# Patient Record
Sex: Female | Born: 2000 | Race: White | Hispanic: No | Marital: Married | Smoking: Never smoker
Health system: Southern US, Community
[De-identification: ages and names within clinical notes are randomized; demographics above are authoritative.]

## PROBLEM LIST (undated history)

## (undated) DIAGNOSIS — J45909 Unspecified asthma, uncomplicated: Secondary | ICD-10-CM

## (undated) DIAGNOSIS — R55 Syncope and collapse: Secondary | ICD-10-CM

## (undated) DIAGNOSIS — F4310 Post-traumatic stress disorder, unspecified: Secondary | ICD-10-CM

## (undated) DIAGNOSIS — F840 Autistic disorder: Secondary | ICD-10-CM

## (undated) DIAGNOSIS — F419 Anxiety disorder, unspecified: Secondary | ICD-10-CM

## (undated) DIAGNOSIS — F32A Depression, unspecified: Secondary | ICD-10-CM

## (undated) DIAGNOSIS — F329 Major depressive disorder, single episode, unspecified: Secondary | ICD-10-CM

## (undated) DIAGNOSIS — Z1501 Genetic susceptibility to malignant neoplasm of breast: Secondary | ICD-10-CM

## (undated) DIAGNOSIS — F909 Attention-deficit hyperactivity disorder, unspecified type: Secondary | ICD-10-CM

## (undated) HISTORY — PX: WISDOM TOOTH EXTRACTION: SHX21

## (undated) HISTORY — DX: Genetic susceptibility to malignant neoplasm of breast: Z15.01

## (undated) HISTORY — DX: Unspecified asthma, uncomplicated: J45.909

## (undated) HISTORY — PX: MOUTH SURGERY: SHX715

## (undated) HISTORY — DX: Syncope and collapse: R55

## (undated) HISTORY — PX: DENTAL SURGERY: SHX609

## (undated) HISTORY — PX: CAUTERIZE INNER NOSE: SHX279

---
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
## 2019-01-09 ENCOUNTER — Telehealth: Payer: Self-pay | Admitting: Obstetrics and Gynecology

## 2019-01-09 NOTE — Telephone Encounter (Signed)
Pt not available to speak to regarding restrictions.

## 2019-01-10 ENCOUNTER — Other Ambulatory Visit: Payer: Self-pay

## 2019-01-10 ENCOUNTER — Ambulatory Visit (INDEPENDENT_AMBULATORY_CARE_PROVIDER_SITE_OTHER): Payer: Medicaid Other

## 2019-01-10 DIAGNOSIS — Z3A01 Less than 8 weeks gestation of pregnancy: Secondary | ICD-10-CM

## 2019-01-10 DIAGNOSIS — O3680X0 Pregnancy with inconclusive fetal viability, not applicable or unspecified: Secondary | ICD-10-CM | POA: Diagnosis not present

## 2019-01-10 NOTE — Progress Notes (Signed)
[place] 7+6 wks,single IUP w/ys,positive fht 171 bpm,crl 16.87 mm

## 2019-02-05 ENCOUNTER — Other Ambulatory Visit: Payer: Self-pay

## 2019-02-05 ENCOUNTER — Encounter (HOSPITAL_COMMUNITY): Payer: Self-pay | Admitting: *Deleted

## 2019-02-05 DIAGNOSIS — O99511 Diseases of the respiratory system complicating pregnancy, first trimester: Secondary | ICD-10-CM | POA: Diagnosis not present

## 2019-02-05 DIAGNOSIS — O99891 Other specified diseases and conditions complicating pregnancy: Secondary | ICD-10-CM | POA: Diagnosis not present

## 2019-02-05 DIAGNOSIS — R0789 Other chest pain: Secondary | ICD-10-CM | POA: Insufficient documentation

## 2019-02-05 DIAGNOSIS — Z3A12 12 weeks gestation of pregnancy: Secondary | ICD-10-CM | POA: Diagnosis not present

## 2019-02-05 DIAGNOSIS — Z79899 Other long term (current) drug therapy: Secondary | ICD-10-CM | POA: Insufficient documentation

## 2019-02-05 NOTE — ED Triage Notes (Signed)
Pt woke up with left rib pain, denies any injury.  Denies pain with deep breathes.

## 2019-02-06 ENCOUNTER — Emergency Department (HOSPITAL_COMMUNITY)
Admission: EM | Admit: 2019-02-06 | Discharge: 2019-02-06 | Disposition: A | Discharge status: Home / Self Care | Payer: Medicaid Other | Attending: Emergency Medicine | Admitting: Emergency Medicine

## 2019-02-06 DIAGNOSIS — Z3A12 12 weeks gestation of pregnancy: Secondary | ICD-10-CM

## 2019-02-06 DIAGNOSIS — R079 Chest pain, unspecified: Secondary | ICD-10-CM

## 2019-02-06 NOTE — Discharge Instructions (Addendum)
Use ice and heat over the area for comfort. Take acetaminophen 650 mg 4 times a day for pain.  Recheck if you get fever, cough, shortness of breath or have wheezing not controlled by your inhaler.  [facility].

## 2019-02-06 NOTE — ED Provider Notes (Signed)
[facility] EMERGENCY DEPARTMENT Provider Note   CSN: [id number] Arrival date & time: 02/05/19  2238   Time seen 1:37 AM  History   Chief Complaint Chief Complaint  Patient presents with  . Chest Pain    left rib pain     HPI Audrey Matthews is a 18 y.o. female.     HPI patient states about 10 AM today after she got up she started having left lateral chest pain that she describes as achy and constant.  Laying on her left side makes it worse, nothing makes it feel better although she did states she took 2 Tylenol at noon and it helped for about an hour.  She denies cough, fever, rhinorrhea, shortness of breath, wheezing, loss of taste or smell.  She denies any injury although she states she is a violent sleeper and she is even given herself a black why sleeping.  She states she has had this pain before when she was having a flareup of her asthma when she was about 18 years old.  Patient states she is G1, P0 Ab0, approximately [redacted] weeks pregnant.  [facility].  She denies any problems of her pregnancy other than lots of nausea.  PCP Practice, [facility]   Past Medical History:  Diagnosis Date  . ADHD (attention deficit hyperactivity disorder)   . Anxiety   . Asthma   . Autism   . Depression   . PTSD (post-traumatic stress disorder)     Patient Active Problem List   Diagnosis Date Noted  . Encounter to determine fetal viability of pregnancy 12/27/2018  . Less than [redacted] weeks gestation of pregnancy 12/27/2018  . Pregnancy examination or test, positive result 12/27/2018  . Depression 12/27/2018  . Decreased appetite 10/08/2015  . Severe episode of recurrent major depressive disorder, without psychotic features (HCC)   . Attention-deficit hyperactivity disorder, other type   . Attention deficit hyperactivity disorder (ADHD) 10/04/2015  . Irritability and anger 10/04/2015  . MDD (major depressive disorder), recurrent episode, severe (HCC) 10/03/2015    Past Surgical History:  Procedure Laterality Date  . DENTAL SURGERY    . MOUTH SURGERY       OB History    Gravida  1   Para      Term      Preterm      AB      Living        SAB      TAB      Ectopic      Multiple      Live Births               Home Medications    Prior to Admission medications   Medication Sig Start Date End Date Taking? Authorizing Provider  acetaminophen (TYLENOL) 500 MG tablet Take 500 mg by mouth every 6 (six) hours as needed for moderate pain.    [provider]  Beclomethasone Dipropionate (QVAR IN) Inhale into the lungs as needed.    [provider]  prenatal vitamin w/FE, FA (PRENATAL 1 + 1) 27-1 MG TABS tablet Take 1 tablet by mouth daily at 12 noon. 12/27/18   Adline PotterGriffin, Jennifer A, NP    Family History Family History  Problem Relation Age of Onset  . Depression Mother   . Anxiety disorder Mother   . ADD / ADHD Mother   . Drug abuse Father   . Depression Sister   . Depression Maternal Grandmother  Social History Social History   Tobacco Use  . Smoking status: Never Smoker  . Smokeless tobacco: Never Used  Substance Use Topics  . Alcohol use: No  . Drug use: No     Allergies   Patient has no known allergies.   Review of Systems Review of Systems  All other systems reviewed and are negative.    Physical Exam Updated Vital Signs BP (!) 141/84 (BP Location: Right Arm)   Pulse (!) 105   Temp 99.6 F (37.6 C) (Oral)   Resp 14   Ht 5\' 1"  (1.549 m)   Wt 71.7 kg   LMP 11/16/2018   SpO2 100%   BMI 29.85 kg/m   Physical Exam Vitals signs and nursing note reviewed.  Constitutional:      General: She is not in acute distress.    Appearance: Normal appearance.  HENT:     Head: Normocephalic and atraumatic.     Right Ear: External ear normal.     Left Ear: External ear normal.     Nose: Nose normal.     Mouth/Throat:     Mouth: Mucous membranes are dry.     Pharynx: No  oropharyngeal exudate or posterior oropharyngeal erythema.  Eyes:     Extraocular Movements: Extraocular movements intact.     Conjunctiva/sclera: Conjunctivae normal.     Pupils: Pupils are equal, round, and reactive to light.  Neck:     Musculoskeletal: Normal range of motion.  Cardiovascular:     Rate and Rhythm: Normal rate and regular rhythm.  Pulmonary:     Effort: Pulmonary effort is normal. No respiratory distress.     Breath sounds: Normal breath sounds. No stridor. No wheezing, rhonchi or rales.  Chest:     Chest wall: Tenderness present.       Comments: Patient indicates she is having pain in a area about the size of 2 palms and the left axilla with a tender spot that is in the anterior axillary line and in the middle of the upper and lower borders of the discomfort.  When I look at the skin the skin is normal without redness, I do not feel any subcutaneous masses or crepitance.There is no bruising noted to the skin. Musculoskeletal: Normal range of motion.  Skin:    General: Skin is warm and dry.  Neurological:     General: No focal deficit present.     Mental Status: She is alert and oriented to person, place, and time.  Psychiatric:        Mood and Affect: Mood normal.        Behavior: Behavior normal.        Thought Content: Thought content normal.      ED Treatments / Results  Labs (all labs ordered are listed, but only abnormal results are displayed) Labs Reviewed - No data to display  EKG None  Radiology No results found.  Procedures Procedures (including critical care time)  Medications Ordered in ED Medications - No data to display   Initial Impression / Assessment and Plan / ED Course  I have reviewed the triage vital signs and the nursing notes.  Pertinent labs & imaging results that were available during my care of the patient were reviewed by me and considered in my medical decision making (see chart for details).       We discussed  getting x-rays however I do not suspect a rib fracture, she has no symptoms such as fever or  coughing or shortness of breath or even wheezing.  Tylenol did help her pain before.  I told her to return if she gets fever, worsening shortness of breath and we would consider doing x-rays at that time.  Otherwise she should continue with the Tylenol and use ice packs for comfort.  Go to have the nurses do fetal heart tones.  FHT our nursing staff was unable to assess fetal heart tones, however I reassured patient that in the ED the nurses are not always able to find the heart tone in early pregnancy like they can in the OB office.  She is having no lower abdominal pain or bleeding.  She can keep her prenatal appointments.  Final Clinical Impressions(s) / ED Diagnoses   Final diagnoses:  Chest pain, unspecified type  [redacted] weeks gestation of pregnancy    ED Discharge Orders    None    OTC acetaminophen  Plan discharge  Devoria Albe, MD, Concha Pyo, MD 02/06/19 0230

## 2019-02-06 NOTE — ED Notes (Signed)
Unable to assess fetal heart tone with doppler.

## 2019-02-08 ENCOUNTER — Other Ambulatory Visit: Payer: Self-pay | Admitting: Obstetrics & Gynecology

## 2019-02-08 ENCOUNTER — Telehealth: Payer: Self-pay | Admitting: Advanced Practice Midwife

## 2019-02-08 DIAGNOSIS — Z3682 Encounter for antenatal screening for nuchal translucency: Secondary | ICD-10-CM

## 2019-02-08 NOTE — Telephone Encounter (Signed)

## 2019-02-09 ENCOUNTER — Ambulatory Visit (INDEPENDENT_AMBULATORY_CARE_PROVIDER_SITE_OTHER): Payer: Medicaid Other

## 2019-02-09 ENCOUNTER — Encounter: Payer: Self-pay | Admitting: Advanced Practice Midwife

## 2019-02-09 ENCOUNTER — Ambulatory Visit: Payer: Medicaid Other | Admitting: *Deleted

## 2019-02-09 ENCOUNTER — Other Ambulatory Visit: Payer: Self-pay

## 2019-02-09 ENCOUNTER — Ambulatory Visit (INDEPENDENT_AMBULATORY_CARE_PROVIDER_SITE_OTHER): Payer: Medicaid Other | Admitting: Advanced Practice Midwife

## 2019-02-09 VITALS — BP 128/85 | HR 84 | Wt 150.0 lb

## 2019-02-09 DIAGNOSIS — Z3A12 12 weeks gestation of pregnancy: Secondary | ICD-10-CM

## 2019-02-09 DIAGNOSIS — Z3401 Encounter for supervision of normal first pregnancy, first trimester: Secondary | ICD-10-CM

## 2019-02-09 DIAGNOSIS — O0990 Supervision of high risk pregnancy, unspecified, unspecified trimester: Secondary | ICD-10-CM | POA: Insufficient documentation

## 2019-02-09 DIAGNOSIS — F332 Major depressive disorder, recurrent severe without psychotic features: Secondary | ICD-10-CM

## 2019-02-09 DIAGNOSIS — Z1371 Encounter for nonprocreative screening for genetic disease carrier status: Secondary | ICD-10-CM | POA: Diagnosis not present

## 2019-02-09 DIAGNOSIS — Z1389 Encounter for screening for other disorder: Secondary | ICD-10-CM

## 2019-02-09 DIAGNOSIS — Z113 Encounter for screening for infections with a predominantly sexual mode of transmission: Secondary | ICD-10-CM

## 2019-02-09 DIAGNOSIS — Z3682 Encounter for antenatal screening for nuchal translucency: Secondary | ICD-10-CM

## 2019-02-09 DIAGNOSIS — Z36 Encounter for antenatal screening for chromosomal anomalies: Secondary | ICD-10-CM

## 2019-02-09 DIAGNOSIS — Z3400 Encounter for supervision of normal first pregnancy, unspecified trimester: Secondary | ICD-10-CM | POA: Insufficient documentation

## 2019-02-09 DIAGNOSIS — Z1379 Encounter for other screening for genetic and chromosomal anomalies: Secondary | ICD-10-CM

## 2019-02-09 DIAGNOSIS — Z0283 Encounter for blood-alcohol and blood-drug test: Secondary | ICD-10-CM

## 2019-02-09 DIAGNOSIS — Z130 Encounter for screening for diseases of the blood and blood-forming organs and certain disorders involving the immune mechanism: Secondary | ICD-10-CM

## 2019-02-09 DIAGNOSIS — J45909 Unspecified asthma, uncomplicated: Secondary | ICD-10-CM | POA: Insufficient documentation

## 2019-02-09 DIAGNOSIS — Z331 Pregnant state, incidental: Secondary | ICD-10-CM

## 2019-02-09 DIAGNOSIS — J4520 Mild intermittent asthma, uncomplicated: Secondary | ICD-10-CM

## 2019-02-09 LAB — POCT URINALYSIS DIPSTICK OB
Blood, UA: NEGATIVE
Glucose, UA: NEGATIVE
Ketones, UA: NEGATIVE
Leukocytes, UA: NEGATIVE
Nitrite, UA: NEGATIVE
POC,PROTEIN,UA: NEGATIVE

## 2019-02-09 MED ORDER — BLOOD PRESSURE MONITOR MISC: 0 refills | Status: DC

## 2019-02-09 MED ORDER — OMEPRAZOLE 20 MG PO CPDR: 20.0000 mg | DELAYED_RELEASE_CAPSULE | Freq: Every day | ORAL | 6 refills | Status: DC

## 2019-02-09 NOTE — Patient Instructions (Signed)
 First Trimester of Pregnancy The first trimester of pregnancy is from week 1 until the end of week 12 (months 1 through 3). A week after a sperm fertilizes an egg, the egg will implant on the wall of the uterus. This embryo will begin to develop into a baby. Genes from you and your partner are forming the baby. The female genes determine whether the baby is a boy or a girl. At 6-8 weeks, the eyes and face are formed, and the heartbeat can be seen on ultrasound. At the end of 12 weeks, all the baby's organs are formed.  Now that you are pregnant, you will want to do everything you can to have a healthy baby. Two of the most important things are to get good prenatal care and to follow your health care provider's instructions. Prenatal care is all the medical care you receive before the baby's birth. This care will help prevent, find, and treat any problems during the pregnancy and childbirth. BODY CHANGES Your body goes through many changes during pregnancy. The changes vary from woman to woman.   You may gain or lose a couple of pounds at first.  You may feel sick to your stomach (nauseous) and throw up (vomit). If the vomiting is uncontrollable, call your health care provider.  You may tire easily.  You may develop headaches that can be relieved by medicines approved by your health care provider.  You may urinate more often. Painful urination may mean you have a bladder infection.  You may develop heartburn as a result of your pregnancy.  You may develop constipation because certain hormones are causing the muscles that push waste through your intestines to slow down.  You may develop hemorrhoids or swollen, bulging veins (varicose veins).  Your breasts may begin to grow larger and become tender. Your nipples may stick out more, and the tissue that surrounds them (areola) may become darker.  Your gums may bleed and may be sensitive to brushing and flossing.  Dark spots or blotches  (chloasma, mask of pregnancy) may develop on your face. This will likely fade after the baby is born.  Your menstrual periods will stop.  You may have a loss of appetite.  You may develop cravings for certain kinds of food.  You may have changes in your emotions from day to day, such as being excited to be pregnant or being concerned that something may go wrong with the pregnancy and baby.  You may have more vivid and strange dreams.  You may have changes in your hair. These can include thickening of your hair, rapid growth, and changes in texture. Some women also have hair loss during or after pregnancy, or hair that feels dry or thin. Your hair will most likely return to normal after your baby is born. WHAT TO EXPECT AT YOUR PRENATAL VISITS During a routine prenatal visit:  You will be weighed to make sure you and the baby are growing normally.  Your blood pressure will be taken.  Your abdomen will be measured to track your baby's growth.  The fetal heartbeat will be listened to starting around week 10 or 12 of your pregnancy.  Test results from any previous visits will be discussed. Your health care provider may ask you:  How you are feeling.  If you are feeling the baby move.  If you have had any abnormal symptoms, such as leaking fluid, bleeding, severe headaches, or abdominal cramping.  If you have any questions. Other   tests that may be performed during your first trimester include:  Blood tests to find your blood type and to check for the presence of any previous infections. They will also be used to check for low iron levels (anemia) and Rh antibodies. Later in the pregnancy, blood tests for diabetes will be done along with other tests if problems develop.  Urine tests to check for infections, diabetes, or protein in the urine.  An ultrasound to confirm the proper growth and development of the baby.  An amniocentesis to check for possible genetic problems.  Fetal  screens for spina bifida and Down syndrome.  You may need other tests to make sure you and the baby are doing well. HOME CARE INSTRUCTIONS  Medicines  Follow your health care provider's instructions regarding medicine use. Specific medicines may be either safe or unsafe to take during pregnancy.  Take your prenatal vitamins as directed.  If you develop constipation, try taking a stool softener if your health care provider approves. Diet  Eat regular, well-balanced meals. Choose a variety of foods, such as meat or vegetable-based protein, fish, milk and low-fat dairy products, vegetables, fruits, and whole grain breads and cereals. Your health care provider will help you determine the amount of weight gain that is right for you.  Avoid raw meat and uncooked cheese. These carry germs that can cause birth defects in the baby.  Eating four or five small meals rather than three large meals a day may help relieve nausea and vomiting. If you start to feel nauseous, eating a few soda crackers can be helpful. Drinking liquids between meals instead of during meals also seems to help nausea and vomiting.  If you develop constipation, eat more high-fiber foods, such as fresh vegetables or fruit and whole grains. Drink enough fluids to keep your urine clear or pale yellow. Activity and Exercise  Exercise only as directed by your health care provider. Exercising will help you:  Control your weight.  Stay in shape.  Be prepared for labor and delivery.  Experiencing pain or cramping in the lower abdomen or low back is a good sign that you should stop exercising. Check with your health care provider before continuing normal exercises.  Try to avoid standing for long periods of time. Move your legs often if you must stand in one place for a long time.  Avoid heavy lifting.  Wear low-heeled shoes, and practice good posture.  You may continue to have sex unless your health care provider directs you  otherwise. Relief of Pain or Discomfort  Wear a good support bra for breast tenderness.   Take warm sitz baths to soothe any pain or discomfort caused by hemorrhoids. Use hemorrhoid cream if your health care provider approves.   Rest with your legs elevated if you have leg cramps or low back pain.  If you develop varicose veins in your legs, wear support hose. Elevate your feet for 15 minutes, 3-4 times a day. Limit salt in your diet. Prenatal Care  Schedule your prenatal visits by the twelfth week of pregnancy. They are usually scheduled monthly at first, then more often in the last 2 months before delivery.  Write down your questions. Take them to your prenatal visits.  Keep all your prenatal visits as directed by your health care provider. Safety  Wear your seat belt at all times when driving.  Make a list of emergency phone numbers, including numbers for family, friends, the hospital, and police and fire departments. General   Tips  Ask your health care provider for a referral to a local prenatal education class. Begin classes no later than at the beginning of month 6 of your pregnancy.  Ask for help if you have counseling or nutritional needs during pregnancy. Your health care provider can offer advice or refer you to specialists for help with various needs.  Do not use hot tubs, steam rooms, or saunas.  Do not douche or use tampons or scented sanitary pads.  Do not cross your legs for long periods of time.  Avoid cat litter boxes and soil used by cats. These carry germs that can cause birth defects in the baby and possibly loss of the fetus by miscarriage or stillbirth.  Avoid all smoking, herbs, alcohol, and medicines not prescribed by your health care provider. Chemicals in these affect the formation and growth of the baby.  Schedule a dentist appointment. At home, brush your teeth with a soft toothbrush and be gentle when you floss. SEEK MEDICAL CARE IF:   You have  dizziness.  You have mild pelvic cramps, pelvic pressure, or nagging pain in the abdominal area.  You have persistent nausea, vomiting, or diarrhea.  You have a bad smelling vaginal discharge.  You have pain with urination.  You notice increased swelling in your face, hands, legs, or ankles. SEEK IMMEDIATE MEDICAL CARE IF:   You have a fever.  You are leaking fluid from your vagina.  You have spotting or bleeding from your vagina.  You have severe abdominal cramping or pain.  You have rapid weight gain or loss.  You vomit blood or material that looks like coffee grounds.  You are exposed to German measles and have never had them.  You are exposed to fifth disease or chickenpox.  You develop a severe headache.  You have shortness of breath.  You have any kind of trauma, such as from a fall or a car accident. Document Released: 03/03/2001 Document Revised: 07/24/2013 Document Reviewed: 01/17/2013 Exit[employer] Patient Information 2015 [employer]. This information is not intended to replace advice given to you by your health care provider. Make sure you discuss any questions you have with your health care provider.   Nausea & Vomiting  Have saltine crackers or pretzels by your bed and eat a few bites before you raise your head out of bed in the morning  Eat small frequent meals throughout the day instead of large meals  Drink plenty of fluids throughout the day to stay hydrated, just don't drink a lot of fluids with your meals.  This can make your stomach fill up faster making you feel sick  Do not brush your teeth right after you eat  Products with real ginger are good for nausea, like ginger ale and ginger hard candy Make sure it says made with real ginger!  Sucking on sour candy like lemon heads is also good for nausea  If your prenatal vitamins make you nauseated, take them at night so you will sleep through the nausea  Sea Bands  If you feel like you need  medicine for the nausea & vomiting please let us know  If you are unable to keep any fluids or food down please let us know   Constipation  Drink plenty of fluid, preferably water, throughout the day  Eat foods high in fiber such as fruits, vegetables, and grains  Exercise, such as walking, is a good way to keep your bowels regular  Drink warm fluids, especially warm   prune juice, or decaf coffee  Eat a 1/2 cup of real oatmeal (not instant), 1/2 cup applesauce, and 1/2-1 cup warm prune juice every day  If needed, you may take Colace (docusate sodium) stool softener once or twice a day to help keep the stool soft. If you are pregnant, wait until you are out of your first trimester (12-14 weeks of pregnancy)  If you still are having problems with constipation, you may take Miralax once daily as needed to help keep your bowels regular.  If you are pregnant, wait until you are out of your first trimester (12-14 weeks of pregnancy)  Safe Medications in Pregnancy   Acne: Benzoyl Peroxide Salicylic Acid  Backache/Headache: Tylenol: 2 regular strength every 4 hours OR              2 Extra strength every 6 hours  Colds/Coughs/Allergies: Benadryl (alcohol free) 25 mg every 6 hours as needed Breath right strips Claritin Cepacol throat lozenges Chloraseptic throat spray Cold-Eeze- up to three times per day Cough drops, alcohol free Flonase (by prescription only) Guaifenesin Mucinex Robitussin DM (plain only, alcohol free) Saline nasal spray/drops Sudafed (pseudoephedrine) & Actifed ** use only after [redacted] weeks gestation and if you do not have high blood pressure Tylenol Vicks Vaporub Zinc lozenges Zyrtec   Constipation: Colace Ducolax suppositories Fleet enema Glycerin suppositories Metamucil Milk of magnesia Miralax Senokot Smooth move tea  Diarrhea: Kaopectate Imodium A-D  *NO pepto Bismol  Hemorrhoids: Anusol Anusol HC Preparation  H Tucks  Indigestion: Tums Maalox Mylanta Zantac  Pepcid  Insomnia: Benadryl (alcohol free) 25mg every 6 hours as needed Tylenol PM Unisom, no Gelcaps  Leg Cramps: Tums MagGel  Nausea/Vomiting:  Bonine Dramamine Emetrol Ginger extract Sea bands Meclizine  Nausea medication to take during pregnancy:  Unisom (doxylamine succinate 25 mg tablets) Take one tablet daily at bedtime. If symptoms are not adequately controlled, the dose can be increased to a maximum recommended dose of two tablets daily (1/2 tablet in the morning, 1/2 tablet mid-afternoon and one at bedtime). Vitamin B6 100mg tablets. Take one tablet twice a day (up to 200 mg per day).  Skin Rashes: Aveeno products Benadryl cream or 25mg every 6 hours as needed Calamine Lotion 1% cortisone cream  Yeast infection: Gyne-lotrimin 7 Monistat 7   **If taking multiple medications, please check labels to avoid duplicating the same active ingredients **take medication as directed on the label ** Do not exceed 4000 mg of tylenol in 24 hours **Do not take medications that contain aspirin or ibuprofen      

## 2019-02-09 NOTE — Progress Notes (Signed)
[place] 12+1 wks,measurements c/w dates,crl 56.06 mm,normal ovaries bilat,anterior placenta gr 0,NB present,NT 1.4 mm,fhr 158 bpm

## 2019-02-09 NOTE — Progress Notes (Signed)
INITIAL OBSTETRICAL VISIT Patient name: Audrey Matthews MRN [id number]  Date of birth: 2000-05-17 Chief Complaint:   Initial Prenatal Visit (nt/it, heartburn)  History of Present Illness:   Audrey Matthews is a 18 y.o. G1P0  female at [redacted]w[redacted]d by LMP/8 week [place] with an Estimated Date of Delivery: 08/23/19 being seen today for her initial obstetrical visit.   Her obstetrical history is significant for first pregnancy.   Today she reports some heartburn (rx prilosec) and nausea (declines meds). .  Patient's last menstrual period was 11/16/2018 (exact date). Last pap n/a Review of Systems:   Pertinent items are noted in HPI Denies cramping/contractions, leakage of fluid, vaginal bleeding, abnormal vaginal discharge w/ itching/odor/irritation, headaches, visual changes, shortness of breath, chest pain, abdominal pain, severe nausea/vomiting, or problems with urination or bowel movements unless otherwise stated above.  Pertinent History Reviewed:  Reviewed past medical,surgical, social, obstetrical and family history.  Reviewed problem list, medications and allergies.  Some nausea, declines meds Has asthma, uses inhaler occasionally  Hx depression, ADHD/bipolar/PTSD.  Has been hospitalized 4 times in the past, says she's tired of it running her life, committed to keeping up with therapy.  Does group zoom depression calls once a week and one-on-one therapy once a week as well. No meds, aware that there are meds she can take if deemed necessary.   OB History  Gravida Para Term Preterm AB Living  1            SAB TAB Ectopic Multiple Live Births               # Outcome Date GA Lbr Len/2nd Weight Sex Delivery Anes PTL Lv  1 Current            Physical Assessment:   Vitals:   02/09/19 0950  BP: 128/85  Pulse: 84  Weight: 150 lb (68 kg)  Body mass index is 28.34 kg/m.       Physical Examination:  General appearance - well appearing, and in no distress  Mental status - alert, oriented to person,  place, and time  Psych:  She has a normal mood and affect  Skin - warm and dry, normal color, no suspicious lesions noted  Chest - effort normal, all lung fields clear to auscultation bilaterally  Heart - normal rate and regular rhythm  Abdomen - soft, nontender  Extremities:  No swelling or varicosities noted    via [place] 12+1 wks,measurements c/w dates,crl 56.06 mm,normal ovaries bilat,anterior placenta gr 0,NB present,NT 1.4 mm,fhr 158 bpm  Results for orders placed or performed in visit on 02/09/19 (from the past 24 hour(s))  POC Urinalysis Dipstick OB   Collection Time: 02/09/19 10:06 AM  Result Value Ref Range   Color, UA     Clarity, UA     Glucose, UA Negative Negative   Bilirubin, UA     Ketones, UA neg    Spec Grav, UA     Blood, UA neg    pH, UA     POC,PROTEIN,UA Negative Negative, Trace, Small (1+), Moderate (2+), Large (3+), 4+   Urobilinogen, UA     Nitrite, UA neg    Leukocytes, UA Negative Negative   Appearance     Odor      Assessment & Plan:  1) Low-Risk Pregnancy G1P0 at [redacted]w[redacted]d with an Estimated Date of Delivery: 08/23/19   2) Initial OB visit  3)  Depression/bipolar/PTSD:  Twice weekly therapy, no meds.   Meds:  Meds ordered this encounter  Medications  . Blood Pressure Monitor MISC    Sig: For regular home bp monitoring during pregnancy    Dispense:  1 each    Refill:  0    Z34.00  . omeprazole (PRILOSEC) 20 MG capsule    Sig: Take 1 capsule (20 mg total) by mouth daily.    Dispense:  30 capsule    Refill:  6    Order Specific Question:   Supervising Provider    Answer:   Florian Buff [[id number]]    Initial labs obtained Continue prenatal vitamins Reviewed n/v relief measures and warning s/s to report Reviewed recommended weight gain based on pre-gravid BMI Encouraged well-balanced diet Watched video for carrier screening/genetic testing:  Genetic Screening discussed First Screen and Integrated Screen: requested Cystic fibrosis screening  requested SMA screening requested Fragile X screening requested Ultrasound discussed; fetal survey: requested CCNC completed  Follow-up: Return in about 3 weeks (around 03/02/2019) for OB Mychart visit.   Orders Placed This Encounter  Procedures  . Urine Culture  . GC/Chlamydia Probe Amp  . Urinalysis, Routine w reflex microscopic  . Pain Management Screening Profile (10S)  . Obstetric Panel, Including HIV  . Sickle Cell Scr  . Integrated 1  . MaterniT 21 plus Core, Blood  . Inheritest Core(CF97,SMA,FraX)  . POC Urinalysis Dipstick OB    Christin Fudge DNP, CNM 02/09/2019 10:40 AM

## 2019-02-10 LAB — URINALYSIS, ROUTINE W REFLEX MICROSCOPIC

## 2019-02-10 LAB — PMP SCREEN PROFILE (10S), URINE

## 2019-02-10 LAB — SPECIMEN STATUS REPORT

## 2019-02-10 LAB — SICKLE CELL SCREEN: Sickle Cell Screen: NEGATIVE

## 2019-02-11 LAB — OBSTETRIC PANEL, INCLUDING HIV
Antibody Screen: NEGATIVE
Basophils Absolute: 0 10*3/uL (ref 0.0–0.2)
Basos: 0 %
EOS (ABSOLUTE): 0.1 10*3/uL (ref 0.0–0.4)
Eos: 1 %
HIV Screen 4th Generation wRfx: NONREACTIVE
Hematocrit: 37.4 % (ref 34.0–46.6)
Hemoglobin: 12.9 g/dL (ref 11.1–15.9)
Hepatitis B Surface Ag: NEGATIVE
Immature Grans (Abs): 0 10*3/uL (ref 0.0–0.1)
Immature Granulocytes: 0 %
Lymphocytes Absolute: 2.6 10*3/uL (ref 0.7–3.1)
Lymphs: 27 %
MCH: 29.5 pg (ref 26.6–33.0)
MCHC: 34.5 g/dL (ref 31.5–35.7)
MCV: 86 fL (ref 79–97)
Monocytes Absolute: 0.7 10*3/uL (ref 0.1–0.9)
Monocytes: 8 %
Neutrophils Absolute: 6.2 10*3/uL (ref 1.4–7.0)
Neutrophils: 64 %
Platelets: 306 10*3/uL (ref 150–450)
RBC: 4.37 x10E6/uL (ref 3.77–5.28)
RDW: 13.4 % (ref 11.7–15.4)
RPR Ser Ql: NONREACTIVE
Rh Factor: NEGATIVE
Rubella Antibodies, IGG: <0.9 index — ABNORMAL LOW (ref >0.99)
WBC: 9.7 10*3/uL (ref 3.4–10.8)

## 2019-02-13 LAB — URINE CULTURE

## 2019-02-13 LAB — SPECIMEN STATUS REPORT

## 2019-02-14 LAB — GC/CHLAMYDIA PROBE AMP
Chlamydia trachomatis, NAA: NEGATIVE
Neisseria Gonorrhoeae by PCR: NEGATIVE

## 2019-02-14 LAB — URINALYSIS, ROUTINE W REFLEX MICROSCOPIC
Bilirubin, UA: NEGATIVE
Glucose, UA: NEGATIVE
Ketones, UA: NEGATIVE
Leukocytes,UA: NEGATIVE
Nitrite, UA: NEGATIVE
Protein,UA: NEGATIVE
RBC, UA: NEGATIVE
Specific Gravity, UA: 1.022 (ref 1.005–1.030)
Urobilinogen, Ur: 0.2 mg/dL (ref 0.2–1.0)
pH, UA: 8.5 — ABNORMAL HIGH (ref 5.0–7.5)

## 2019-02-14 LAB — PMP SCREEN PROFILE (10S), URINE
Amphetamine Scrn, Ur: NEGATIVE ng/mL
BARBITURATE SCREEN URINE: NEGATIVE ng/mL
BENZODIAZEPINE SCREEN, URINE: NEGATIVE ng/mL
CANNABINOIDS UR QL SCN: NEGATIVE ng/mL
Cocaine (Metab) Scrn, Ur: NEGATIVE ng/mL
Creatinine(Crt), U: 174.6 mg/dL (ref 20.0–300.0)
Methadone Screen, Urine: NEGATIVE ng/mL
OXYCODONE+OXYMORPHONE UR QL SCN: NEGATIVE ng/mL
Opiate Scrn, Ur: NEGATIVE ng/mL
Ph of Urine: 8.9 (ref 4.5–8.9)
Phencyclidine Qn, Ur: NEGATIVE ng/mL
Propoxyphene Scrn, Ur: NEGATIVE ng/mL

## 2019-02-14 LAB — INTEGRATED 1
Crown Rump Length: 56.1 mm
Gest. Age on Collection Date: 12 weeks
Maternal Age at EDD: 19.9 yr
Nuchal Translucency (NT): 1.4 mm
Number of Fetuses: 1
PAPP-A Value: 1337 ng/mL
Weight: 150 [lb_av]

## 2019-02-14 LAB — MATERNIT 21 PLUS CORE, BLOOD
Fetal Fraction: 11
Result (T21): NEGATIVE
Trisomy 13 (Patau syndrome): NEGATIVE
Trisomy 18 (Edwards syndrome): NEGATIVE
Trisomy 21 (Down syndrome): NEGATIVE

## 2019-02-14 LAB — SPECIMEN STATUS REPORT

## 2019-02-25 LAB — INHERITEST CORE(CF97,SMA,FRAX)

## 2019-03-02 ENCOUNTER — Telehealth (INDEPENDENT_AMBULATORY_CARE_PROVIDER_SITE_OTHER): Payer: Medicaid Other | Admitting: Advanced Practice Midwife

## 2019-03-02 ENCOUNTER — Other Ambulatory Visit: Payer: Self-pay

## 2019-03-02 ENCOUNTER — Encounter: Payer: Self-pay | Admitting: *Deleted

## 2019-03-02 ENCOUNTER — Encounter: Payer: Self-pay | Admitting: Advanced Practice Midwife

## 2019-03-02 VITALS — BP 109/58

## 2019-03-02 DIAGNOSIS — Z3402 Encounter for supervision of normal first pregnancy, second trimester: Secondary | ICD-10-CM | POA: Diagnosis not present

## 2019-03-02 DIAGNOSIS — Z3A15 15 weeks gestation of pregnancy: Secondary | ICD-10-CM

## 2019-03-02 DIAGNOSIS — Z363 Encounter for antenatal screening for malformations: Secondary | ICD-10-CM

## 2019-03-02 NOTE — Progress Notes (Signed)
   TELEHEALTH VIRTUAL OBSTETRICS VISIT ENCOUNTER NOTE  I connected with Audrey Matthews on 03/02/19 at  2:50 PM EST by telephone at home and verified that I am speaking with the correct person using two identifiers.   I discussed the limitations, risks, security and privacy concerns of performing an evaluation and management service by telephone and the availability of in person appointments. I also discussed with the patient that there may be a patient responsible charge related to this service. The patient expressed understanding and agreed to proceed.  Subjective:  Audrey Matthews is a 18 y.o. G1P0 at [redacted]w[redacted]d being followed for ongoing prenatal care.  She is currently monitored for the following issues for this low-risk pregnancy and has MDD (major depressive disorder), recurrent episode, severe ([place]); Attention deficit hyperactivity disorder (ADHD); Irritability and anger; Severe episode of recurrent major depressive disorder, without psychotic features ([place]); Attention-deficit hyperactivity disorder, other type; Decreased appetite; Depression; Autism spectrum disorder; Borderline personality disorder ([place]); Chronic post-traumatic stress disorder; Supervision of normal first pregnancy; and Asthma on their problem list.  Patient reports no complaints. Feels much better than in first trimester.  Denies any contractions, bleeding or leaking of fluid.   The following portions of the patient's history were reviewed and updated as appropriate: allergies, current medications, past family history, past medical history, past social history, past surgical history and problem list.   Objective:   General:  Alert, oriented and cooperative.   Mental Status: Normal mood and affect perceived. Normal judgment and thought content.  Rest of physical exam deferred due to type of encounter  Assessment and Plan:  Pregnancy: G1P0 at [redacted]w[redacted]d 1. Encounter for supervision of normal first pregnancy in second  trimester   Preterm labor symptoms and general obstetric precautions including but not limited to vaginal bleeding, contractions, leaking of fluid and fetal movement were reviewed in detail with the patient.  I discussed the assessment and treatment plan with the patient. The patient was provided an opportunity to ask questions and all were answered. The patient agreed with the plan and demonstrated an understanding of the instructions. The patient was advised to call back or seek an in-person office evaluation/go to [facility] at [facility] for any urgent or concerning symptoms. Please refer to After Visit Summary for other counseling recommendations.   I provided 10 minutes of non-face-to-face time during this encounter.  Return in about 4 weeks (around 03/30/2019) for IO:NGEXBMW.  No future appointments.  Christin Fudge, [place] for [employer], [place]

## 2019-03-02 NOTE — Patient Instructions (Signed)
Audrey Matthews, I greatly value your feedback.  If you receive a survey following your visit with [place] today, we appreciate you taking the time to fill it out.  Thanks, Nigel Berthold, CNM     [place]!!! It is now [place] at [facility] ([place], [place] [address]) Entrance located off of [place] parking   Go to [email or web address] to register for FREE online childbirth classes    Second Trimester of Pregnancy The second trimester is from week 14 through week 27 (months 4 through 6). The second trimester is often a time when you feel your best. Your body has adjusted to being pregnant, and you begin to feel better physically. Usually, morning sickness has lessened or quit completely, you may have more energy, and you may have an increase in appetite. The second trimester is also a time when the fetus is growing rapidly. At the end of the sixth month, the fetus is about 9 inches long and weighs about 1 pounds. You will likely begin to feel the baby move (quickening) between 16 and 20 weeks of pregnancy. Body changes during your second trimester Your body continues to go through many changes during your second trimester. The changes vary from woman to woman.  Your weight will continue to increase. You will notice your lower abdomen bulging out.  You may begin to get stretch marks on your hips, abdomen, and breasts.  You may develop headaches that can be relieved by medicines. The medicines should be approved by your health care provider.  You may urinate more often because the fetus is pressing on your bladder.  You may develop or continue to have heartburn as a result of your pregnancy.  You may develop constipation because certain hormones are causing the muscles that push waste through your intestines to slow down.  You may develop hemorrhoids or swollen, bulging veins (varicose veins).  You may have back  pain. This is caused by: ? Weight gain. ? Pregnancy hormones that are relaxing the joints in your pelvis. ? A shift in weight and the muscles that support your balance.  Your breasts will continue to grow and they will continue to become tender.  Your gums may bleed and may be sensitive to brushing and flossing.  Dark spots or blotches (chloasma, mask of pregnancy) may develop on your face. This will likely fade after the baby is born.  A dark line from your belly button to the pubic area (linea nigra) may appear. This will likely fade after the baby is born.  You may have changes in your hair. These can include thickening of your hair, rapid growth, and changes in texture. Some women also have hair loss during or after pregnancy, or hair that feels dry or thin. Your hair will most likely return to normal after your baby is born.  What to expect at prenatal visits During a routine prenatal visit:  You will be weighed to make sure you and the fetus are growing normally.  Your blood pressure will be taken.  Your abdomen will be measured to track your baby's growth.  The fetal heartbeat will be listened to.  Any test results from the previous visit will be discussed.  Your health care provider may ask you:  How you are feeling.  If you are feeling the baby move.  If you have had any abnormal symptoms, such as leaking fluid, bleeding, severe headaches, or abdominal  cramping.  If you are using any tobacco products, including cigarettes, chewing tobacco, and electronic cigarettes.  If you have any questions.  Other tests that may be performed during your second trimester include:  Blood tests that check for: ? Low iron levels (anemia). ? High blood sugar that affects pregnant women (gestational diabetes) between 65 and 28 weeks. ? Rh antibodies. This is to check for a protein on red blood cells (Rh factor).  Urine tests to check for infections, diabetes, or protein in the  urine.  An ultrasound to confirm the proper growth and development of the baby.  An amniocentesis to check for possible genetic problems.  Fetal screens for spina bifida and Down syndrome.  HIV (human immunodeficiency virus) testing. Routine prenatal testing includes screening for HIV, unless you choose not to have this test.  Follow these instructions at home: Medicines  Follow your health care provider's instructions regarding medicine use. Specific medicines may be either safe or unsafe to take during pregnancy.  Take a prenatal vitamin that contains at least 600 micrograms (mcg) of folic acid.  If you develop constipation, try taking a stool softener if your health care provider approves. Eating and drinking  Eat a balanced diet that includes fresh fruits and vegetables, whole grains, good sources of protein such as meat, eggs, or tofu, and low-fat dairy. Your health care provider will help you determine the amount of weight gain that is right for you.  Avoid raw meat and uncooked cheese. These carry germs that can cause birth defects in the baby.  If you have low calcium intake from food, talk to your health care provider about whether you should take a daily calcium supplement.  Limit foods that are high in fat and processed sugars, such as fried and sweet foods.  To prevent constipation: ? Drink enough fluid to keep your urine clear or pale yellow. ? Eat foods that are high in fiber, such as fresh fruits and vegetables, whole grains, and beans. Activity  Exercise only as directed by your health care provider. Most women can continue their usual exercise routine during pregnancy. Try to exercise for 30 minutes at least 5 days a week. Stop exercising if you experience uterine contractions.  Avoid heavy lifting, wear low heel shoes, and practice good posture.  A sexual relationship may be continued unless your health care provider directs you otherwise. Relieving pain and  discomfort  Wear a good support bra to prevent discomfort from breast tenderness.  Take warm sitz baths to soothe any pain or discomfort caused by hemorrhoids. Use hemorrhoid cream if your health care provider approves.  Rest with your legs elevated if you have leg cramps or low back pain.  If you develop varicose veins, wear support hose. Elevate your feet for 15 minutes, 3-4 times a day. Limit salt in your diet. Prenatal Care  Write down your questions. Take them to your prenatal visits.  Keep all your prenatal visits as told by your health care provider. This is important. Safety  Wear your seat belt at all times when driving.  Make a list of emergency phone numbers, including numbers for family, friends, the hospital, and police and fire departments. General instructions  Ask your health care provider for a referral to a local prenatal education class. Begin classes no later than the beginning of month 6 of your pregnancy.  Ask for help if you have counseling or nutritional needs during pregnancy. Your health care provider can offer advice or refer  you to specialists for help with various needs.  Do not use hot tubs, steam rooms, or saunas.  Do not douche or use tampons or scented sanitary pads.  Do not cross your legs for long periods of time.  Avoid cat litter boxes and soil used by cats. These carry germs that can cause birth defects in the baby and possibly loss of the fetus by miscarriage or stillbirth.  Avoid all smoking, herbs, alcohol, and unprescribed drugs. Chemicals in these products can affect the formation and growth of the baby.  Do not use any products that contain nicotine or tobacco, such as cigarettes and e-cigarettes. If you need help quitting, ask your health care provider.  Visit your dentist if you have not gone yet during your pregnancy. Use a soft toothbrush to brush your teeth and be gentle when you floss. Contact a health care provider if:  You  have dizziness.  You have mild pelvic cramps, pelvic pressure, or nagging pain in the abdominal area.  You have persistent nausea, vomiting, or diarrhea.  You have a bad smelling vaginal discharge.  You have pain when you urinate. Get help right away if:  You have a fever.  You are leaking fluid from your vagina.  You have spotting or bleeding from your vagina.  You have severe abdominal cramping or pain.  You have rapid weight gain or weight loss.  You have shortness of breath with chest pain.  You notice sudden or extreme swelling of your face, hands, ankles, feet, or legs.  You have not felt your baby move in over an hour.  You have severe headaches that do not go away when you take medicine.  You have vision changes. Summary  The second trimester is from week 14 through week 27 (months 4 through 6). It is also a time when the fetus is growing rapidly.  Your body goes through many changes during pregnancy. The changes vary from woman to woman.  Avoid all smoking, herbs, alcohol, and unprescribed drugs. These chemicals affect the formation and growth your baby.  Do not use any tobacco products, such as cigarettes, chewing tobacco, and e-cigarettes. If you need help quitting, ask your health care provider.  Contact your health care provider if you have any questions. Keep all prenatal visits as told by your health care provider. This is important. This information is not intended to replace advice given to you by your health care provider. Make sure you discuss any questions you have with your health care provider.

## 2019-03-11 ENCOUNTER — Other Ambulatory Visit: Payer: Self-pay

## 2019-03-11 ENCOUNTER — Inpatient Hospital Stay (HOSPITAL_BASED_OUTPATIENT_CLINIC_OR_DEPARTMENT_OTHER): Payer: Medicaid Other

## 2019-03-11 ENCOUNTER — Inpatient Hospital Stay (HOSPITAL_COMMUNITY): Payer: Medicaid Other

## 2019-03-11 ENCOUNTER — Encounter (HOSPITAL_COMMUNITY): Payer: Self-pay | Admitting: Obstetrics and Gynecology

## 2019-03-11 ENCOUNTER — Inpatient Hospital Stay (HOSPITAL_COMMUNITY)
Admission: AD | Admit: 2019-03-11 | Discharge: 2019-03-11 | Disposition: A | Discharge status: Home / Self Care | Payer: Medicaid Other | Attending: Obstetrics and Gynecology | Admitting: Obstetrics and Gynecology

## 2019-03-11 DIAGNOSIS — O99342 Other mental disorders complicating pregnancy, second trimester: Secondary | ICD-10-CM | POA: Diagnosis not present

## 2019-03-11 DIAGNOSIS — R102 Pelvic and perineal pain: Secondary | ICD-10-CM | POA: Diagnosis not present

## 2019-03-11 DIAGNOSIS — R1031 Right lower quadrant pain: Secondary | ICD-10-CM | POA: Diagnosis not present

## 2019-03-11 DIAGNOSIS — O26872 Cervical shortening, second trimester: Secondary | ICD-10-CM | POA: Diagnosis not present

## 2019-03-11 DIAGNOSIS — F840 Autistic disorder: Secondary | ICD-10-CM | POA: Insufficient documentation

## 2019-03-11 DIAGNOSIS — Z818 Family history of other mental and behavioral disorders: Secondary | ICD-10-CM | POA: Diagnosis not present

## 2019-03-11 DIAGNOSIS — Z3A16 16 weeks gestation of pregnancy: Secondary | ICD-10-CM

## 2019-03-11 DIAGNOSIS — N949 Unspecified condition associated with female genital organs and menstrual cycle: Secondary | ICD-10-CM

## 2019-03-11 DIAGNOSIS — Z3686 Encounter for antenatal screening for cervical length: Secondary | ICD-10-CM

## 2019-03-11 DIAGNOSIS — J45909 Unspecified asthma, uncomplicated: Secondary | ICD-10-CM | POA: Diagnosis not present

## 2019-03-11 DIAGNOSIS — O99891 Other specified diseases and conditions complicating pregnancy: Secondary | ICD-10-CM | POA: Insufficient documentation

## 2019-03-11 DIAGNOSIS — O99512 Diseases of the respiratory system complicating pregnancy, second trimester: Secondary | ICD-10-CM | POA: Diagnosis not present

## 2019-03-11 DIAGNOSIS — O26879 Cervical shortening, unspecified trimester: Secondary | ICD-10-CM

## 2019-03-11 DIAGNOSIS — Z79899 Other long term (current) drug therapy: Secondary | ICD-10-CM | POA: Insufficient documentation

## 2019-03-11 LAB — CBC WITH DIFFERENTIAL/PLATELET
Abs Immature Granulocytes: 0.05 10*3/uL (ref 0.00–0.07)
Basophils Absolute: 0 10*3/uL (ref 0.0–0.1)
Basophils Relative: 0 %
Eosinophils Absolute: 0.1 10*3/uL (ref 0.0–0.5)
Eosinophils Relative: 1 %
HCT: 35.4 % — ABNORMAL LOW (ref 36.0–46.0)
Hemoglobin: 12 g/dL (ref 12.0–15.0)
Immature Granulocytes: 1 %
Lymphocytes Relative: 22 %
Lymphs Abs: 2.3 10*3/uL (ref 0.7–4.0)
MCH: 29.4 pg (ref 26.0–34.0)
MCHC: 33.9 g/dL (ref 30.0–36.0)
MCV: 86.8 fL (ref 80.0–100.0)
Monocytes Absolute: 0.8 10*3/uL (ref 0.1–1.0)
Monocytes Relative: 7 %
Neutro Abs: 7.4 10*3/uL (ref 1.7–7.7)
Neutrophils Relative %: 69 %
Platelets: 265 10*3/uL (ref 150–400)
RBC: 4.08 MIL/uL (ref 3.87–5.11)
RDW: 13.2 % (ref 11.5–15.5)
WBC: 10.6 10*3/uL — ABNORMAL HIGH (ref 4.0–10.5)
nRBC: 0 % (ref 0.0–0.2)

## 2019-03-11 LAB — COMPREHENSIVE METABOLIC PANEL
ALT: 11 U/L (ref 0–44)
AST: 14 U/L — ABNORMAL LOW (ref 15–41)
Albumin: 3.2 g/dL — ABNORMAL LOW (ref 3.5–5.0)
Alkaline Phosphatase: 53 U/L (ref 38–126)
Anion gap: 9 (ref 5–15)
BUN: <5 mg/dL — ABNORMAL LOW (ref 6–20)
CO2: 21 mmol/L — ABNORMAL LOW (ref 22–32)
Calcium: 9.3 mg/dL (ref 8.9–10.3)
Chloride: 107 mmol/L (ref 98–111)
Creatinine, Ser: 0.57 mg/dL (ref 0.44–1.00)
GFR calc Af Amer: >60 mL/min (ref >60)
GFR calc non Af Amer: >60 mL/min (ref >60)
Glucose, Bld: 78 mg/dL (ref 70–99)
Potassium: 3.5 mmol/L (ref 3.5–5.1)
Sodium: 137 mmol/L (ref 135–145)
Total Bilirubin: 0.3 mg/dL (ref 0.3–1.2)
Total Protein: 6.9 g/dL (ref 6.5–8.1)

## 2019-03-11 LAB — URINALYSIS, ROUTINE W REFLEX MICROSCOPIC
Bilirubin Urine: NEGATIVE
Glucose, UA: NEGATIVE mg/dL
Hgb urine dipstick: NEGATIVE
Ketones, ur: 5 mg/dL — AB
Nitrite: NEGATIVE
Protein, ur: 30 mg/dL — AB
Specific Gravity, Urine: 1.021 (ref 1.005–1.030)
pH: 8 (ref 5.0–8.0)

## 2019-03-11 LAB — WET PREP, GENITAL
Clue Cells Wet Prep HPF POC: NONE SEEN
Sperm: NONE SEEN
Trich, Wet Prep: NONE SEEN
Yeast Wet Prep HPF POC: NONE SEEN

## 2019-03-11 MED ORDER — COMFORT FIT MATERNITY SUPP SM MISC: 1.0000 [IU] | Freq: Every day | 0 refills | Status: DC | PRN

## 2019-03-11 NOTE — Discharge Instructions (Signed)
PREGNANCY SUPPORT BELT: You are not alone, Seventy-five percent of women have some sort of abdominal or back pain at some point in their pregnancy. Your baby is growing at a fast pace, which means that your whole body is rapidly trying to adjust to the changes. As your uterus grows, your back may start feeling a bit under stress and this can result in back or abdominal pain that can go from mild, and therefore bearable, to severe pains that will not allow you to sit or lay down comfortably, When it comes to dealing with pregnancy-related pains and cramps, some pregnant women usually prefer natural remedies, which the market is filled with nowadays. For example, wearing a pregnancy support belt can help ease and lessen your discomfort and pain. WHAT ARE THE BENEFITS OF WEARING A PREGNANCY SUPPORT BELT? A pregnancy support belt provides support to the lower portion of the belly taking some of the weight of the growing uterus and distributing to the other parts of your body. It is designed make you comfortable and gives you extra support. Over the years, the pregnancy apparel market has been studying the needs and wants of pregnant women and they have come up with the most comfortable pregnancy support belts that woman could ever ask for. In fact, you will no longer have to wear a stretched-out or bulky pregnancy belt that is visible underneath your clothes and makes you feel even more uncomfortable. Nowadays, a pregnancy support belt is made of comfortable and stretchy materials that will not irritate your skin but will actually make you feel at ease and you will not even notice you are wearing it. They are easy to put on and adjust during the day and can be worn at night for additional support.  BENEFITS: . Relives Back pain . Relieves Abdominal Muscle and Leg Pain . Stabilizes the Pelvic Ring . Offers a Cushioned Abdominal Lift Pad . Relieves pressure on the Sciatic Nerve Within Minutes WHERE TO GET  YOUR PREGNANCY BELT: [employer] [phone] @[phone]  [address] [place], [place] [id number]   Round Ligament Pain  The round ligament is a cord of muscle and tissue that helps support the uterus. It can become a source of pain during pregnancy if it becomes stretched or twisted as the baby grows. The pain usually begins in the second trimester (13-28 weeks) of pregnancy, and it can come and go until the baby is delivered. It is not a serious problem, and it does not cause harm to the baby. Round ligament pain is usually a short, sharp, and pinching pain, but it can also be a dull, lingering, and aching pain. The pain is felt in the lower side of the abdomen or in the groin. It usually starts deep in the groin and moves up to the outside of the hip area. The pain may occur when you:  Suddenly change position, such as quickly going from a sitting to standing position.  Roll over in bed.  Cough or sneeze.  Do physical activity. Follow these instructions at home:   Watch your condition for any changes.  When the pain starts, relax. Then try any of these methods to help with the pain: ? Sitting down. ? Flexing your knees up to your abdomen. ? Lying on your side with one pillow under your abdomen and another pillow between your legs. ? Sitting in a warm bath for 15-20 minutes or until the pain goes away.  Take over-the-counter and prescription  medicines only as told by your health care provider.  Move slowly when you sit down or stand up.  Avoid long walks if they cause pain.  Stop or reduce your physical activities if they cause pain.  Keep all follow-up visits as told by your health care provider. This is important. Contact a health care provider if:  Your pain does not go away with treatment.  You feel pain in your back that you did not have before.  Your medicine is not helping. Get help right away if:  You have a fever or chills.  You develop uterine  contractions.  You have vaginal bleeding.  You have nausea or vomiting.  You have diarrhea.  You have pain when you urinate. Summary  Round ligament pain is felt in the lower abdomen or groin. It is usually a short, sharp, and pinching pain. It can also be a dull, lingering, and aching pain.  This pain usually begins in the second trimester (13-28 weeks). It occurs because the uterus is stretching with the growing baby, and it is not harmful to the baby.  You may notice the pain when you suddenly change position, when you cough or sneeze, or during physical activity.  Relaxing, flexing your knees to your abdomen, lying on one side, or taking a warm bath may help to get rid of the pain.  Get help from your health care provider if the pain does not go away or if you have vaginal bleeding, nausea, vomiting, diarrhea, or painful urination. This information is not intended to replace advice given to you by your health care provider. Make sure you discuss any questions you have with your health care provider. Document Released: 12/17/2007 Document Revised: 08/25/2017 Document Reviewed: 08/25/2017 Elsevier Patient Education  2020 [employer]     Abdominal Pain During Pregnancy  Abdominal pain is common during pregnancy, and has many possible causes. Some causes are more serious than others, and sometimes the cause is not known. Abdominal pain can be a sign that labor is starting. It can also be caused by normal growth and stretching of muscles and ligaments during pregnancy. Always tell your health care provider if you have any abdominal pain. Follow these instructions at home:  Do not have sex or put anything in your vagina until your pain goes away completely.  Get plenty of rest until your pain improves.  Drink enough fluid to keep your urine pale yellow.  Take over-the-counter and prescription medicines only as told by your health care provider.  Keep all follow-up visits as  told by your health care provider. This is important. Contact a health care provider if:  Your pain continues or gets worse after resting.  You have lower abdominal pain that: ? Comes and goes at regular intervals. ? Spreads to your back. ? Is similar to menstrual cramps.  You have pain or burning when you urinate. Get help right away if:  You have a fever or chills.  You have vaginal bleeding.  You are leaking fluid from your vagina.  You are passing tissue from your vagina.  You have vomiting or diarrhea that lasts for more than 24 hours.  Your baby is moving less than usual.  You feel very weak or faint.  You have shortness of breath.  You develop severe pain in your upper abdomen. Summary  Abdominal pain is common during pregnancy, and has many possible causes.  If you experience abdominal pain during pregnancy, tell your health care provider  right away.  Follow your health care provider's home care instructions and keep all follow-up visits as directed. This information is not intended to replace advice given to you by your health care provider. Make sure you discuss any questions you have with your health care provider. Document Released: 03/09/2005 Document Revised: 06/27/2018 Document Reviewed: 06/11/2016 Elsevier Patient Education  [place].                     Safe Medications in Pregnancy    Acne: Benzoyl Peroxide Salicylic Acid  Backache/Headache: Tylenol: 2 regular strength every 4 hours OR              2 Extra strength every 6 hours  Colds/Coughs/Allergies: Benadryl (alcohol free) 25 mg every 6 hours as needed Breath right strips Claritin Cepacol throat lozenges Chloraseptic throat spray Cold-Eeze- up to three times per day Cough drops, alcohol free Flonase (by prescription only) Guaifenesin Mucinex Robitussin DM (plain only, alcohol free) Saline nasal spray/drops Sudafed (pseudoephedrine) & Actifed ** use only after [redacted] weeks  gestation and if you do not have high blood pressure Tylenol Vicks Vaporub Zinc lozenges Zyrtec   Constipation: Colace Ducolax suppositories Fleet enema Glycerin suppositories Metamucil Milk of magnesia Miralax Senokot Smooth move tea  Diarrhea: Kaopectate Imodium A-D  *NO pepto Bismol  Hemorrhoids: Anusol Anusol HC Preparation H Tucks  Indigestion: Tums Maalox Mylanta Zantac  Pepcid  Insomnia: Benadryl (alcohol free) 25mg  every 6 hours as needed Tylenol PM Unisom, no Gelcaps  Leg Cramps: Tums MagGel  Nausea/Vomiting:  Bonine Dramamine Emetrol Ginger extract Sea bands Meclizine  Nausea medication to take during pregnancy:  Unisom (doxylamine succinate 25 mg tablets) Take one tablet daily at bedtime. If symptoms are not adequately controlled, the dose can be increased to a maximum recommended dose of two tablets daily (1/2 tablet in the morning, 1/2 tablet mid-afternoon and one at bedtime). Vitamin B6 100mg  tablets. Take one tablet twice a day (up to 200 mg per day).  Skin Rashes: Aveeno products Benadryl cream or 25mg  every 6 hours as needed Calamine Lotion 1% cortisone cream  Yeast infection: Gyne-lotrimin 7 Monistat 7   **If taking multiple medications, please check labels to avoid duplicating the same active ingredients **take medication as directed on the label ** Do not exceed 4000 mg of tylenol in 24 hours **Do not take medications that contain aspirin or ibuprofen

## 2019-03-11 NOTE — [facility] Note (Signed)
Audrey Matthews is a 18 y.o. at [redacted]w[redacted]d here in [facility]U reporting: around 0800 started having some uncomfortable cramping, around 10 started having stabbing pain above her appendix. Cramping pain has gone away but stabbing pain is still there but is much less severe. No bleeding, having some white and thick discharge, no odor or itching.  Onset of complaint: today  Pain score: 4/10  Vitals:   03/11/19 1255  BP: 128/81  Pulse: 96  Resp: 16  Temp: 98.6 F (37 C)  SpO2: 100%     FHT: 145  Lab orders placed from triage: [facility]

## 2019-03-11 NOTE — MAU Provider Note (Signed)
History     CSN: [id number]  Arrival date and time: 03/11/19 1215   First Provider Initiated Contact with Patient 03/11/19 1333      Chief Complaint  Patient presents with  . Abdominal Pain  . Vaginal Discharge   [name] is a 18 y.o. G1P0 at [redacted]w[redacted]d who presents to MAU for abdominal pain.  Onset: 10AM this morning Location: lower abdomen, right side Duration: <24hrs Character: sharp, stabbing Aggravating/Associated: walking/cramping that has since resolved Relieving: none Treatment: none Severity: 3/10, improving  Pt denies VB, LOF, ctx, vaginal discharge/odor/itching. Pt denies N/V, constipation, diarrhea, or urinary problems. Pt denies fever, chills, fatigue, sweating or changes in appetite. Pt denies SOB or chest pain. Pt denies dizziness, HA, light-headedness, weakness.  Problems this pregnancy include: none. Allergies? NKDA Current medications/supplements? Inhaler, PNVs [facility]renatal care provider? [facility], next appt 03/2019  Patient's mother-in-law present for majority of visit, excluding physical exam.   OB History    Gravida  1   Para      Term      Preterm      AB      Living        SAB      TAB      Ectopic      Multiple      Live Births              Past Medical History:  Diagnosis Date  . ADHD (attention deficit hyperactivity disorder)   . Anxiety   . Asthma   . Autism   . Depression   . PTSD (post-traumatic stress disorder)     Past Surgical History:  Procedure Laterality Date  . CAUTERIZE INNER NOSE    . DENTAL SURGERY    . MOUTH SURGERY      Family History  Problem Relation Age of Onset  . Depression Mother   . Anxiety disorder Mother   . ADD / ADHD Mother   . Drug abuse Father   . Depression Sister   . Depression Maternal Grandmother     Social History   Tobacco Use  . Smoking status: Never Smoker  . Smokeless tobacco: Never Used  Substance Use Topics  . Alcohol use: Not Currently  . Drug  use: No    Allergies: No Known Allergies  Medications Prior to Admission  Medication Sig Dispense Refill Last Dose  . acetaminophen (TYLENOL) 500 MG tablet Take 500 mg by mouth every 6 (six) hours as needed for moderate pain.     Marland Kitchen albuterol (VENTOLIN HFA) 108 (90 Base) MCG/ACT inhaler SMARTSIG:2 Puff(s) Via Inhaler Daily     . Beclomethasone Dipropionate (QVAR IN) Inhale into the lungs as needed.     . Blood Pressure Monitor MISC For regular home bp monitoring during pregnancy 1 each 0   . omeprazole (PRILOSEC) 20 MG capsule Take 1 capsule (20 mg total) by mouth daily. (Patient not taking: Reported on 03/02/2019) 30 capsule 6   . prenatal vitamin w/FE, FA (PRENATAL 1 + 1) 27-1 MG TABS tablet Take 1 tablet by mouth daily at 12 noon. 30 tablet 12     Review of Systems  Constitutional: Negative for chills, diaphoresis, fatigue and fever.  Eyes: Negative for visual disturbance.  Respiratory: Negative for shortness of breath.   Cardiovascular: Negative for chest pain.  Gastrointestinal: Positive for abdominal pain. Negative for constipation, diarrhea, nausea and vomiting.  Genitourinary: Negative for dysuria, flank pain, frequency, pelvic pain, urgency, vaginal bleeding and vaginal  discharge.  Neurological: Negative for dizziness, weakness, light-headedness and headaches.   Physical Exam   Blood pressure 128/81, pulse 96, temperature 98.6 F (37 C), temperature source Oral, resp. rate 16, height 5\' 1"  (1.549 m), weight 68.7 kg, last menstrual period 11/16/2018, SpO2 100 %.  Patient Vitals for the past 24 hrs:  BP Temp Temp src Pulse Resp SpO2 Height Weight  03/11/19 1255 128/81 98.6 F (37 C) Oral 96 16 100 % -- --  03/11/19 1242 -- -- -- -- -- -- 5\' 1"  (1.549 m) 68.7 kg   Physical Exam  Constitutional: She is oriented to person, place, and time. She appears well-developed and well-nourished. No distress.  HENT:  Head: Normocephalic and atraumatic.  Respiratory: Effort normal.   GI: Soft. She exhibits no distension and no mass. There is abdominal tenderness in the right lower quadrant. There is no rebound and no guarding.  Genitourinary: There is no rash, tenderness or lesion on the right labia. There is no rash, tenderness or lesion on the left labia.    Genitourinary Comments: CE: closed/posterior, but feels shortened on exam   Neurological: She is alert and oriented to person, place, and time.  Skin: Skin is warm and dry. She is not diaphoretic.  Psychiatric: She has a normal mood and affect. Her behavior is normal. Judgment and thought content normal.   Results for orders placed or performed during the hospital encounter of 03/11/19 (from the past 24 hour(s))  Urinalysis, Routine w reflex microscopic     Status: Abnormal   Collection Time: 03/11/19 12:46 PM  Result Value Ref Range   Color, Urine YELLOW YELLOW   APPearance HAZY (A) CLEAR   Specific Gravity, Urine 1.021 1.005 - 1.030   pH 8.0 5.0 - 8.0   Glucose, UA NEGATIVE NEGATIVE mg/dL   Hgb urine dipstick NEGATIVE NEGATIVE   Bilirubin Urine NEGATIVE NEGATIVE   Ketones, ur 5 (A) NEGATIVE mg/dL   Protein, ur 30 (A) NEGATIVE mg/dL   Nitrite NEGATIVE NEGATIVE   Leukocytes,Ua SMALL (A) NEGATIVE   RBC / HPF 0-5 0 - 5 RBC/hpf   WBC, UA 0-5 0 - 5 WBC/hpf   Bacteria, UA FEW (A) NONE SEEN   Squamous Epithelial / LPF 6-10 0 - 5   Mucus PRESENT   CBC with Differential/Platelet     Status: Abnormal   Collection Time: 03/11/19  2:18 PM  Result Value Ref Range   WBC 10.6 (H) 4.0 - 10.5 K/uL   RBC 4.08 3.87 - 5.11 MIL/uL   Hemoglobin 12.0 12.0 - 15.0 g/dL   HCT 96.235.4 (L) 95.236.0 - 84.146.0 %   MCV 86.8 80.0 - 100.0 fL   MCH 29.4 26.0 - 34.0 pg   MCHC 33.9 30.0 - 36.0 g/dL   RDW 32.413.2 40.111.5 - 02.715.5 %   Platelets 265 150 - 400 K/uL   nRBC 0.0 0.0 - 0.2 %   Neutrophils Relative % 69 %   Neutro Abs 7.4 1.7 - 7.7 K/uL   Lymphocytes Relative 22 %   Lymphs Abs 2.3 0.7 - 4.0 K/uL   Monocytes Relative 7 %   Monocytes  Absolute 0.8 0.1 - 1.0 K/uL   Eosinophils Relative 1 %   Eosinophils Absolute 0.1 0.0 - 0.5 K/uL   Basophils Relative 0 %   Basophils Absolute 0.0 0.0 - 0.1 K/uL   Immature Granulocytes 1 %   Abs Immature Granulocytes 0.05 0.00 - 0.07 K/uL  Comprehensive metabolic panel     Status: Abnormal  Collection Time: 03/11/19  2:18 PM  Result Value Ref Range   Sodium 137 135 - 145 mmol/L   Potassium 3.5 3.5 - 5.1 mmol/L   Chloride 107 98 - 111 mmol/L   CO2 21 (L) 22 - 32 mmol/L   Glucose, Bld 78 70 - 99 mg/dL   BUN <5 (L) 6 - 20 mg/dL   Creatinine, Ser 6.76 0.44 - 1.00 mg/dL   Calcium 9.3 8.9 - 19.5 mg/dL   Total Protein 6.9 6.5 - 8.1 g/dL   Albumin 3.2 (L) 3.5 - 5.0 g/dL   AST 14 (L) 15 - 41 U/L   ALT 11 0 - 44 U/L   Alkaline Phosphatase 53 38 - 126 U/L   Total Bilirubin 0.3 0.3 - 1.2 mg/dL   GFR calc non Af Amer >60 >60 mL/min   GFR calc Af Amer >60 >60 mL/min   Anion gap 9 5 - 15  Wet prep, genital     Status: Abnormal   Collection Time: 03/11/19  2:27 PM   Specimen: PATH Cytology Cervicovaginal Ancillary Only  Result Value Ref Range   Yeast Wet Prep HPF POC NONE SEEN NONE SEEN   Trich, Wet Prep NONE SEEN NONE SEEN   Clue Cells Wet Prep HPF POC NONE SEEN NONE SEEN   WBC, Wet Prep HPF POC MODERATE (A) NONE SEEN   Sperm NONE SEEN    US Abdomen Limited  Result Date: 03/11/2019 CLINICAL DATA:  18 year old pregnant female with RIGHT LOWER quadrant abdominal pain. EXAM: ULTRASOUND ABDOMEN LIMITED TECHNIQUE: Wallace Cullens scale imaging of the right lower quadrant was performed to evaluate for suspected appendicitis. Standard imaging planes and graded compression technique were utilized. COMPARISON:  None. FINDINGS: The appendix is not visualized. Ancillary findings: None. Factors affecting image quality: None. Other findings: RIGHT ovary appears normal. IMPRESSION: Non visualization of the appendix. Non-visualization of appendix by [place] does not definitely exclude appendicitis. If there is  sufficient clinical concern, consider abdomen pelvis CT with contrast for further evaluation. Electronically Signed   By: Harmon Pier M.D.   On: 03/11/2019 16:12   MAU Course  Procedures  MDM -resolving RLQ pain -pt denies surgical hx of appendectomy -mild tenderness on palpation of RLQ -temp 98.6 -FHR 145 -CE: closed/posterior, but feels shortened on exam -UA: hazy/5ketones/30PRO/sm leuks/rare bacteria, sending urine for culture -CBC w/ Diff: WNL for pregnancy -CMP: no abnormalities requiring treatment -WetPrep: WNL -GC/CT collected -Limited Abdominal US of RLQ: appendix not visualized, otherwise normal -OB Limited: normal fluid, anterior placenta, echogenic focus in left ventricle, CL 2.28cm -on reassessment, patient states pain now 0/10 -pt discharged to home in stable condition  Orders Placed This Encounter  Procedures  . Wet prep, genital    Standing Status:   Standing    Number of Occurrences:   1  . Culture, OB Urine    Standing Status:   Standing    Number of Occurrences:   1  . [place] MFM OB LIMITED    Please measure cervical length.    Standing Status:   Standing    Number of Occurrences:   1    Order Specific Question:   Symptom/Reason for Exam    Answer:   RLQ abdominal pain [[id number]]  . US Abdomen Limited    Standing Status:   Standing    Number of Occurrences:   1    Order Specific Question:   Symptom/Reason for Exam    Answer:   RLQ abdominal pain [[id number]]  . [place]  MFM OB Transvaginal    Please measure cervical length.    Standing Status:   Standing    Number of Occurrences:   1    Order Specific Question:   Symptom/Reason for Exam    Answer:   RLQ abdominal pain [[id number]]  . Urinalysis, Routine w reflex microscopic    Standing Status:   Standing    Number of Occurrences:   1  . CBC with Differential/Platelet    Standing Status:   Standing    Number of Occurrences:   1  . Comprehensive metabolic panel    Standing Status:   Standing    Number of  Occurrences:   1  . Discharge patient    Order Specific Question:   Discharge disposition    Answer:   01-Home or Self Care [1]    Order Specific Question:   Discharge patient date    Answer:   03/11/2019   Meds ordered this encounter  Medications  . Elastic Bandages & Supports (COMFORT FIT MATERNITY SUPP SM) MISC    Sig: 1 Units by Does not apply route daily as needed.    Dispense:  1 each    Refill:  0    Order Specific Question:   Supervising Provider    Answer:   Aletha Halim [[id number]]   Assessment and Plan   1. RLQ abdominal pain   2. [redacted] weeks gestation of pregnancy   3. Round ligament pain   4. Short cervix affecting pregnancy    Allergies as of 03/11/2019   No Known Allergies     Medication List    TAKE these medications   acetaminophen 500 MG tablet Commonly known as: TYLENOL Take 500 mg by mouth every 6 (six) hours as needed for moderate pain.   albuterol 108 (90 Base) MCG/ACT inhaler Commonly known as: VENTOLIN HFA SMARTSIG:2 Puff(s) Via Inhaler Daily   Blood Pressure Monitor Misc For regular home bp monitoring during pregnancy   [place] 1 Units by Does not apply route daily as needed.   omeprazole 20 MG capsule Commonly known as: PriLOSEC Take 1 capsule (20 mg total) by mouth daily.   prenatal vitamin w/FE, FA 27-1 MG Tabs tablet Take 1 tablet by mouth daily at 12 noon.   QVAR IN Inhale into the lungs as needed.       -will call with culture results, if positive -RX pregnancy belt -message sent to [facility] to schedule CL in one week -dicussed round ligament pain in pregnancy -return MAU precautions discussed -pt discharged to home in stable condition  Elmyra Ricks E  03/11/2019, 4:53 PM

## 2019-03-12 LAB — CULTURE, OB URINE

## 2019-03-13 LAB — GC/CHLAMYDIA PROBE AMP (~~LOC~~) NOT AT ARMC
Chlamydia: NEGATIVE
Comment: NEGATIVE
Comment: NORMAL
Neisseria Gonorrhea: NEGATIVE

## 2019-03-14 ENCOUNTER — Other Ambulatory Visit: Payer: Self-pay

## 2019-03-14 ENCOUNTER — Encounter (HOSPITAL_COMMUNITY): Payer: Self-pay | Admitting: Family Medicine

## 2019-03-14 ENCOUNTER — Inpatient Hospital Stay (HOSPITAL_COMMUNITY)
Admission: AD | Admit: 2019-03-14 | Discharge: 2019-03-14 | Disposition: A | Discharge status: Home / Self Care | Payer: Medicaid Other | Attending: Family Medicine | Admitting: Family Medicine

## 2019-03-14 DIAGNOSIS — Z3A01 Less than 8 weeks gestation of pregnancy: Secondary | ICD-10-CM | POA: Diagnosis not present

## 2019-03-14 DIAGNOSIS — Z3492 Encounter for supervision of normal pregnancy, unspecified, second trimester: Secondary | ICD-10-CM | POA: Diagnosis not present

## 2019-03-14 DIAGNOSIS — Z3A16 16 weeks gestation of pregnancy: Secondary | ICD-10-CM | POA: Insufficient documentation

## 2019-03-14 DIAGNOSIS — O2342 Unspecified infection of urinary tract in pregnancy, second trimester: Secondary | ICD-10-CM

## 2019-03-14 DIAGNOSIS — O26892 Other specified pregnancy related conditions, second trimester: Secondary | ICD-10-CM | POA: Insufficient documentation

## 2019-03-14 DIAGNOSIS — R109 Unspecified abdominal pain: Secondary | ICD-10-CM | POA: Insufficient documentation

## 2019-03-14 LAB — URINALYSIS, ROUTINE W REFLEX MICROSCOPIC
Bilirubin Urine: NEGATIVE
Glucose, UA: NEGATIVE mg/dL
Hgb urine dipstick: NEGATIVE
Ketones, ur: NEGATIVE mg/dL
Nitrite: NEGATIVE
Protein, ur: NEGATIVE mg/dL
Specific Gravity, Urine: 1.017 (ref 1.005–1.030)
pH: 7 (ref 5.0–8.0)

## 2019-03-14 LAB — CBC
HCT: 35 % — ABNORMAL LOW (ref 36.0–46.0)
Hemoglobin: 11.6 g/dL — ABNORMAL LOW (ref 12.0–15.0)
MCH: 29.1 pg (ref 26.0–34.0)
MCHC: 33.1 g/dL (ref 30.0–36.0)
MCV: 87.9 fL (ref 80.0–100.0)
Platelets: 284 10*3/uL (ref 150–400)
RBC: 3.98 MIL/uL (ref 3.87–5.11)
RDW: 13.1 % (ref 11.5–15.5)
WBC: 11.2 10*3/uL — ABNORMAL HIGH (ref 4.0–10.5)
nRBC: 0 % (ref 0.0–0.2)

## 2019-03-14 LAB — WET PREP, GENITAL
Clue Cells Wet Prep HPF POC: NONE SEEN
Sperm: NONE SEEN
Trich, Wet Prep: NONE SEEN
Yeast Wet Prep HPF POC: NONE SEEN

## 2019-03-14 MED ORDER — CEPHALEXIN 500 MG PO CAPS: 500.0000 mg | ORAL_CAPSULE | Freq: Four times a day (QID) | ORAL | 2 refills | Status: DC

## 2019-03-14 NOTE — Discharge Instructions (Signed)
Pregnancy and Urinary Tract Infection ° °A urinary tract infection (UTI) is an infection of any part of the urinary tract. This includes the kidneys, the tubes that connect your kidneys to your bladder (ureters), the bladder, and the tube that carries urine out of your body (urethra). These organs make, store, and get rid of urine in the body. Your health care provider may use other names to describe the infection. An upper UTI affects the ureters and kidneys (pyelonephritis). A lower UTI affects the bladder (cystitis) and urethra (urethritis). °Most urinary tract infections are caused by bacteria in your genital area, around the entrance to your urinary tract (urethra). These bacteria grow and cause irritation and inflammation of your urinary tract. You are more likely to develop a UTI during pregnancy because the physical and hormonal changes your body goes through can make it easier for bacteria to get into your urinary tract. Your growing baby also puts pressure on your bladder and can affect urine flow. It is important to recognize and treat UTIs in pregnancy because of the risk of serious complications for both you and your baby. °How does this affect me? °Symptoms of a UTI include: °· Needing to urinate right away (urgently). °· Frequent urination or passing small amounts of urine frequently. °· Pain or burning with urination. °· Blood in the urine. °· Urine that smells bad or unusual. °· Trouble urinating. °· Cloudy urine. °· Pain in the abdomen or lower back. °· Vaginal discharge. °You may also have: °· Vomiting or a decreased appetite. °· Confusion. °· Irritability or tiredness. °· A fever. °· Diarrhea. °How does this affect my baby? °An untreated UTI during pregnancy could lead to a kidney infection or a systemic infection, which can cause health problems that could affect your baby. Possible complications of an untreated UTI include: °· Giving birth to your baby before 37 weeks of pregnancy  (premature). °· Having a baby with a low birth weight. °· Developing high blood pressure during pregnancy (preeclampsia). °· Having a low hemoglobin level (anemia). °What can I do to lower my risk? °To prevent a UTI: °· Go to the bathroom as soon as you feel the need. Do not hold urine for long periods of time. °· Always wipe from front to back, especially after a bowel movement. Use each tissue one time when you wipe. °· Empty your bladder after sex. °· Keep your genital area dry. °· Drink 6-10 glasses of water each day. °· Do not douche or use deodorant sprays. °How is this treated? °Treatment for this condition may include: °· Antibiotic medicines that are safe to take during pregnancy. °· Other medicines to treat less common causes of UTI. °Follow these instructions at home: °· If you were prescribed an antibiotic medicine, take it as told by your health care provider. Do not stop using the antibiotic even if you start to feel better. °· Keep all follow-up visits as told by your health care provider. This is important. °Contact a health care provider if: °· Your symptoms do not improve or they get worse. °· You have abnormal vaginal discharge. °Get help right away if you: °· Have a fever. °· Have nausea and vomiting. °· Have back or side pain. °· Feel contractions in your uterus. °· Have lower belly pain. °· Have a gush of fluid from your vagina. °· Have blood in your urine. °Summary °· A urinary tract infection (UTI) is an infection of any part of the urinary tract, which includes the   kidneys, ureters, bladder, and urethra. °· Most urinary tract infections are caused by bacteria in your genital area, around the entrance to your urinary tract (urethra). °· You are more likely to develop a UTI during pregnancy. °· If you were prescribed an antibiotic medicine, take it as told by your health care provider. Do not stop using the antibiotic even if you start to feel better. °This information is not intended to  replace advice given to you by your health care provider. Make sure you discuss any questions you have with your health care provider. °Document Released: 07/04/2010 Document Revised: 07/01/2018 Document Reviewed: 02/10/2018 °Elsevier Patient Education © 2020 [employer] ° °

## 2019-03-14 NOTE — MAU Note (Signed)
.   Audrey Matthews is a 18 y.o. at [redacted]w[redacted]d here in MAU reporting: lower abdominal pressure that started this evening. Pt denies any vaginal bleeding.    Onset of complaint: this evening Pain score:5 Vitals:   03/14/19 1852  BP: 139/80  Pulse: (!) 112  Resp: 18  Temp: 98.5 F (36.9 C)     FHT:149 Lab orders placed from triage: UA

## 2019-03-14 NOTE — MAU Provider Note (Addendum)
History     CSN: [id number]  Arrival date and time: 03/14/19 1825   First Provider Initiated Contact with Patient 03/14/19 1904     Chief Complaint  Patient presents with  . Abdominal Pain   HPI  Audrey Matthews is a 18 y.o. G1P0 at [redacted]w[redacted]d who presents to MAU with chief complaint of vaginal pressure and concern for miscarriage. This is a new problem, onset tonight. Patient states she consulted a relative and they expressed concern that she was about to have a miscarriage.  Patient also complains of new onset low back pain and dysuria. These are new complaints, onset earlier today. She denies foul-smelling urine, fever, flank pain. She also denies abdominal pain, vaginal bleeding, abnormal vaginal discharge. She is remote from intercourse.  OB History    Gravida  1   Para      Term      Preterm      AB      Living        SAB      TAB      Ectopic      Multiple      Live Births              Past Medical History:  Diagnosis Date  . ADHD (attention deficit hyperactivity disorder)   . Anxiety   . Asthma   . Autism   . Depression   . PTSD (post-traumatic stress disorder)     Past Surgical History:  Procedure Laterality Date  . CAUTERIZE INNER NOSE    . DENTAL SURGERY    . MOUTH SURGERY      Family History  Problem Relation Age of Onset  . Depression Mother   . Anxiety disorder Mother   . ADD / ADHD Mother   . Drug abuse Father   . Depression Sister   . Depression Maternal Grandmother     Social History   Tobacco Use  . Smoking status: Never Smoker  . Smokeless tobacco: Never Used  Substance Use Topics  . Alcohol use: Not Currently  . Drug use: No    Allergies: No Known Allergies  Medications Prior to Admission  Medication Sig Dispense Refill Last Dose  . acetaminophen (TYLENOL) 500 MG tablet Take 500 mg by mouth every 6 (six) hours as needed for moderate pain.   Past Month at Unknown time  . albuterol (VENTOLIN HFA) 108 (90 Base)  MCG/ACT inhaler SMARTSIG:2 Puff(s) Via Inhaler Daily   Past Month at Unknown time  . Beclomethasone Dipropionate (QVAR IN) Inhale into the lungs as needed.   Past Month at Unknown time  . Blood Pressure Monitor MISC For regular home bp monitoring during pregnancy 1 each 0 03/14/2019 at Unknown time  . Elastic Bandages & Supports (COMFORT FIT MATERNITY SUPP SM) MISC 1 Units by Does not apply route daily as needed. 1 each 0 03/14/2019 at Unknown time  . omeprazole (PRILOSEC) 20 MG capsule Take 1 capsule (20 mg total) by mouth daily. 30 capsule 6 03/14/2019 at Unknown time  . prenatal vitamin w/FE, FA (PRENATAL 1 + 1) 27-1 MG TABS tablet Take 1 tablet by mouth daily at 12 noon. 30 tablet 12 03/14/2019 at Unknown time    Review of Systems  Constitutional: Negative for chills, fatigue and fever.  Respiratory: Negative for shortness of breath.   Gastrointestinal: Negative for abdominal pain, nausea and vomiting.  Genitourinary: Positive for dysuria. Negative for decreased urine volume, difficulty urinating, flank pain, hematuria, pelvic pain,  urgency, vaginal bleeding, vaginal discharge and vaginal pain.  Musculoskeletal: Negative for back pain.  All other systems reviewed and are negative.  Physical Exam   Blood pressure 139/80, pulse (!) 112, temperature 98.5 F (36.9 C), resp. rate 18, weight 68.5 kg, last menstrual period 11/16/2018.  Physical Exam  Nursing note and vitals reviewed. Constitutional: She is oriented to person, place, and time. She appears well-developed and well-nourished.  Cardiovascular: Normal rate and normal heart sounds.  Respiratory: Effort normal and breath sounds normal.  GI: Soft. She exhibits no distension. There is no abdominal tenderness. There is no rebound, no guarding and no CVA tenderness.  Genitourinary:    No vaginal discharge.     Genitourinary Comments: No evidence of injury, abnormal vaginal discharge, bleeding on SSE. No CMT. Cervix visually closed on  exam, confirmed with digital exam   Neurological: She is alert and oriented to person, place, and time.  Skin: Skin is warm and dry.  Psychiatric: She has a normal mood and affect. Her behavior is normal. Judgment and thought content normal.    MAU Course/MDM  Procedures: sterile speculum exam  --Pertinent negatives: fever, hematuria, flank pain, CVAT --Discussed initiating treatment for uncomplicated UTI vs waiting for culture. Patient verbalizes preference for starting antibiotics now and understands she may receive a call from me indicating her urine culture was normal. --Cervical length 2.27 on 03/12/2019. Cervix closed today  Patient Vitals for the past 24 hrs:  BP Temp Pulse Resp SpO2 Weight  03/14/19 2006 120/65 -- 80 16 100 % --  03/14/19 1852 139/80 98.5 F (36.9 C) (!) 112 18 -- 68.5 kg   Results for orders placed or performed during the hospital encounter of 03/14/19 (from the past 24 hour(s))  Wet prep, genital     Status: Abnormal   Collection Time: 03/14/19  7:21 PM  Result Value Ref Range   Yeast Wet Prep HPF POC NONE SEEN NONE SEEN   Trich, Wet Prep NONE SEEN NONE SEEN   Clue Cells Wet Prep HPF POC NONE SEEN NONE SEEN   WBC, Wet Prep HPF POC MANY (A) NONE SEEN   Sperm NONE SEEN   CBC     Status: Abnormal   Collection Time: 03/14/19  7:23 PM  Result Value Ref Range   WBC 11.2 (H) 4.0 - 10.5 K/uL   RBC 3.98 3.87 - 5.11 MIL/uL   Hemoglobin 11.6 (L) 12.0 - 15.0 g/dL   HCT 35.0 (L) 36.0 - 46.0 %   MCV 87.9 80.0 - 100.0 fL   MCH 29.1 26.0 - 34.0 pg   MCHC 33.1 30.0 - 36.0 g/dL   RDW 13.1 11.5 - 15.5 %   Platelets 284 150 - 400 K/uL   nRBC 0.0 0.0 - 0.2 %  Urinalysis, Routine w reflex microscopic     Status: Abnormal   Collection Time: 03/14/19  7:34 PM  Result Value Ref Range   Color, Urine YELLOW YELLOW   APPearance HAZY (A) CLEAR   Specific Gravity, Urine 1.017 1.005 - 1.030   pH 7.0 5.0 - 8.0   Glucose, UA NEGATIVE NEGATIVE mg/dL   Hgb urine dipstick  NEGATIVE NEGATIVE   Bilirubin Urine NEGATIVE NEGATIVE   Ketones, ur NEGATIVE NEGATIVE mg/dL   Protein, ur NEGATIVE NEGATIVE mg/dL   Nitrite NEGATIVE NEGATIVE   Leukocytes,Ua TRACE (A) NEGATIVE   RBC / HPF 0-5 0 - 5 RBC/hpf   WBC, UA 6-10 0 - 5 WBC/hpf   Bacteria, UA RARE (A) NONE  SEEN   Squamous Epithelial / LPF 6-10 0 - 5   Mucus PRESENT    Ca Oxalate Crys, UA PRESENT    Meds ordered this encounter  Medications  . cephALEXin (KEFLEX) 500 MG capsule    Sig: Take 1 capsule (500 mg total) by mouth 4 (four) times daily.    Dispense:  28 capsule    Refill:  2    Order Specific Question:   Supervising Provider    Answer:   Levie HeritageSTINSON, JACOB J [[id number]]    Assessment and Plan  --18 y.o. G1P0 at 2663w6d  --FHT 149 by Doppler --Concern for UTI in pregnancy, urine culture in work --Discharge home in stable condition  F/U: --Repeat cervical length 03/22/19 --[facility]. Next appointment 04/03/2019  Calvert CantorSamantha C , CNM 03/14/2019, 8:47 PM

## 2019-03-15 ENCOUNTER — Telehealth: Payer: Self-pay | Admitting: Advanced Practice Midwife

## 2019-03-15 LAB — GC/CHLAMYDIA PROBE AMP (~~LOC~~) NOT AT ARMC
Chlamydia: NEGATIVE
Comment: NEGATIVE
Comment: NORMAL
Neisseria Gonorrhea: NEGATIVE

## 2019-03-15 LAB — CULTURE, OB URINE

## 2019-03-15 NOTE — Telephone Encounter (Signed)
Patient notified by phone at 12:45pm that her urine culture was normal and antibiotics are not indicated at this time. Patient verbalized understanding.  Mallie Snooks, MSN, CNM Certified Nurse Midwife, [facility] for [employer], [facility] 03/15/19 12:53 PM

## 2019-03-21 ENCOUNTER — Other Ambulatory Visit: Payer: Self-pay | Admitting: Obstetrics & Gynecology

## 2019-03-21 DIAGNOSIS — O26879 Cervical shortening, unspecified trimester: Secondary | ICD-10-CM

## 2019-03-22 ENCOUNTER — Telehealth: Payer: Self-pay | Admitting: *Deleted

## 2019-03-22 ENCOUNTER — Other Ambulatory Visit: Payer: Medicaid Other

## 2019-03-22 NOTE — Telephone Encounter (Signed)
Patient called wanting to know if it was safe to reschedule cervical ultrasound.

## 2019-03-23 ENCOUNTER — Telehealth: Payer: Self-pay | Admitting: *Deleted

## 2019-03-23 ENCOUNTER — Encounter: Payer: Self-pay | Admitting: *Deleted

## 2019-03-23 NOTE — Telephone Encounter (Signed)
Returned patient's call and LM that I would advise to keep u/s appointment as we have no u/s appt until after 1/11 and her last u/s should a short cervix.  Advised to call our office if she had further questions. Will also send mychart message.

## 2019-03-24 NOTE — L&D Delivery Note (Signed)
OB/GYN Faculty Practice Delivery Note  Audrey Matthews is a 19 y.o. G1P0 s/p NSVD at [redacted]w[redacted]d. She was admitted for PPROM/PTL.   ROM: 9h 74m with clear fluid GBS Status: unknown- received 2 doses of AMP Maximum Maternal Temperature: 98.7*F  Labor Progress: . She was admitted in PPROM on 06/01/2019. She progressed into pre-term labor. She received 1 dose of BMZ, 2 doses of AMP, 1 dose of azithromycin, and had ~1 hour of magnesium. She progressed quickly and delivered shortly after achieving complete dilation.  Delivery Date/Time: 06/01/2019, 0944 Delivery:  Head delivered ROA. No nuchal cord present. Shoulder and body delivered in usual fashion. Infant with spontaneous cry. Due to gestational age and weak breath sounds, delayed cord clamping was not done. The cord was immediately clamped x 2 and cut, and the infant was handed off to the awaiting neonatal resuscitation team. Arterial cord gas was drawn. Cord blood drawn. Placenta delivered spontaneously with gentle cord traction and fundal massage. Fundus firm with massage and Pitocin. Labia, perineum, vagina, and cervix were inspected, and found to be intact. Post-placental IUD was then placed- see separate procedure note for details.  Placenta: intact, 2 cm x 5 cm blood clot seen on the placenta- suspicious for abruption, 3 vessel cord, to pathology Complications: pre-term labor and PPROM- suspected abruption Lacerations: none EBL: 100 mL Analgesia: None  Postpartum Planning [x]  message to sent to schedule follow-up  [x]  vaccines UTD  Infant: female  APGARs 7, 8  1110 g  Arterial Cord Gas: 7.335  , DO OB/GYN Fellow, Faculty Practice

## 2019-03-29 ENCOUNTER — Other Ambulatory Visit: Payer: Self-pay | Admitting: Obstetrics & Gynecology

## 2019-03-29 DIAGNOSIS — O26879 Cervical shortening, unspecified trimester: Secondary | ICD-10-CM

## 2019-03-29 DIAGNOSIS — Z363 Encounter for antenatal screening for malformations: Secondary | ICD-10-CM

## 2019-03-30 ENCOUNTER — Ambulatory Visit (INDEPENDENT_AMBULATORY_CARE_PROVIDER_SITE_OTHER): Payer: Medicaid Other | Admitting: Advanced Practice Midwife

## 2019-03-30 ENCOUNTER — Ambulatory Visit (INDEPENDENT_AMBULATORY_CARE_PROVIDER_SITE_OTHER): Payer: Medicaid Other

## 2019-03-30 ENCOUNTER — Other Ambulatory Visit: Payer: Self-pay

## 2019-03-30 ENCOUNTER — Other Ambulatory Visit: Payer: Self-pay | Admitting: Advanced Practice Midwife

## 2019-03-30 DIAGNOSIS — Z3A19 19 weeks gestation of pregnancy: Secondary | ICD-10-CM

## 2019-03-30 DIAGNOSIS — O26872 Cervical shortening, second trimester: Secondary | ICD-10-CM

## 2019-03-30 DIAGNOSIS — N883 Incompetence of cervix uteri: Secondary | ICD-10-CM

## 2019-03-30 DIAGNOSIS — Z363 Encounter for antenatal screening for malformations: Secondary | ICD-10-CM

## 2019-03-30 DIAGNOSIS — O0992 Supervision of high risk pregnancy, unspecified, second trimester: Secondary | ICD-10-CM

## 2019-03-30 DIAGNOSIS — Z3402 Encounter for supervision of normal first pregnancy, second trimester: Secondary | ICD-10-CM

## 2019-03-30 DIAGNOSIS — O26879 Cervical shortening, unspecified trimester: Secondary | ICD-10-CM

## 2019-03-30 MED ORDER — PROGESTERONE MICRONIZED 200 MG PO CAPS: 200.0000 mg | ORAL_CAPSULE | Freq: Every day | ORAL | 4 refills | Status: DC

## 2019-03-30 NOTE — Patient Instructions (Signed)
Cord Blood Banking  Audrey Matthews, I greatly value your feedback.  If you receive a survey following your visit with [place] today, we appreciate you taking the time to fill it out.  Thanks, Cathie Beams, CNM     [facility] HAS MOVED!!! It is now [facility] at [facility] ([address] [place], [place] [address]) Entrance located off of [address] Free 24/7 valet parking   Go to [email or web address] to register for FREE online childbirth classes    Second Trimester of Pregnancy The second trimester is from week 14 through week 27 (months 4 through 6). The second trimester is often a time when you feel your best. Your body has adjusted to being pregnant, and you begin to feel better physically. Usually, morning sickness has lessened or quit completely, you may have more energy, and you may have an increase in appetite. The second trimester is also a time when the fetus is growing rapidly. At the end of the sixth month, the fetus is about 9 inches long and weighs about 1 pounds. You will likely begin to feel the baby move (quickening) between 16 and 20 weeks of pregnancy. Body changes during your second trimester Your body continues to go through many changes during your second trimester. The changes vary from woman to woman.  Your weight will continue to increase. You will notice your lower abdomen bulging out.  You may begin to get stretch marks on your hips, abdomen, and breasts.  You may develop headaches that can be relieved by medicines. The medicines should be approved by your health care provider.  You may urinate more often because the fetus is pressing on your bladder.  You may develop or continue to have heartburn as a result of your pregnancy.  You may develop constipation because certain hormones are causing the muscles that push waste through your intestines to slow down.  You may develop hemorrhoids or swollen, bulging veins (varicose  veins).  You may have back pain. This is caused by: ? Weight gain. ? Pregnancy hormones that are relaxing the joints in your pelvis. ? A shift in weight and the muscles that support your balance.  Your breasts will continue to grow and they will continue to become tender.  Your gums may bleed and may be sensitive to brushing and flossing.  Dark spots or blotches (chloasma, mask of pregnancy) may develop on your face. This will likely fade after the baby is born.  A dark line from your belly button to the pubic area (linea nigra) may appear. This will likely fade after the baby is born.  You may have changes in your hair. These can include thickening of your hair, rapid growth, and changes in texture. Some women also have hair loss during or after pregnancy, or hair that feels dry or thin. Your hair will most likely return to normal after your baby is born.  What to expect at prenatal visits During a routine prenatal visit:  You will be weighed to make sure you and the fetus are growing normally.  Your blood pressure will be taken.  Your abdomen will be measured to track your baby's growth.  The fetal heartbeat will be listened to.  Any test results from the previous visit will be discussed.  Your health care provider may ask you:  How you are feeling.  If you are feeling the baby move.  If you have had any abnormal symptoms, such as leaking fluid, bleeding,  severe headaches, or abdominal cramping.  If you are using any tobacco products, including cigarettes, chewing tobacco, and electronic cigarettes.  If you have any questions.  Other tests that may be performed during your second trimester include:  Blood tests that check for: ? Low iron levels (anemia). ? High blood sugar that affects pregnant women (gestational diabetes) between 1 and 28 weeks. ? Rh antibodies. This is to check for a protein on red blood cells (Rh factor).  Urine tests to check for infections,  diabetes, or protein in the urine.  An ultrasound to confirm the proper growth and development of the baby.  An amniocentesis to check for possible genetic problems.  Fetal screens for spina bifida and Down syndrome.  HIV (human immunodeficiency virus) testing. Routine prenatal testing includes screening for HIV, unless you choose not to have this test.  Follow these instructions at home: Medicines  Follow your health care provider's instructions regarding medicine use. Specific medicines may be either safe or unsafe to take during pregnancy.  Take a prenatal vitamin that contains at least 600 micrograms (mcg) of folic acid.  If you develop constipation, try taking a stool softener if your health care provider approves. Eating and drinking  Eat a balanced diet that includes fresh fruits and vegetables, whole grains, good sources of protein such as meat, eggs, or tofu, and low-fat dairy. Your health care provider will help you determine the amount of weight gain that is right for you.  Avoid raw meat and uncooked cheese. These carry germs that can cause birth defects in the baby.  If you have low calcium intake from food, talk to your health care provider about whether you should take a daily calcium supplement.  Limit foods that are high in fat and processed sugars, such as fried and sweet foods.  To prevent constipation: ? Drink enough fluid to keep your urine clear or pale yellow. ? Eat foods that are high in fiber, such as fresh fruits and vegetables, whole grains, and beans. Activity  Exercise only as directed by your health care provider. Most women can continue their usual exercise routine during pregnancy. Try to exercise for 30 minutes at least 5 days a week. Stop exercising if you experience uterine contractions.  Avoid heavy lifting, wear low heel shoes, and practice good posture.  A sexual relationship may be continued unless your health care provider directs you  otherwise. Relieving pain and discomfort  Wear a good support bra to prevent discomfort from breast tenderness.  Take warm sitz baths to soothe any pain or discomfort caused by hemorrhoids. Use hemorrhoid cream if your health care provider approves.  Rest with your legs elevated if you have leg cramps or low back pain.  If you develop varicose veins, wear support hose. Elevate your feet for 15 minutes, 3-4 times a day. Limit salt in your diet. Prenatal Care  Write down your questions. Take them to your prenatal visits.  Keep all your prenatal visits as told by your health care provider. This is important. Safety  Wear your seat belt at all times when driving.  Make a list of emergency phone numbers, including numbers for family, friends, the hospital, and police and fire departments. General instructions  Ask your health care provider for a referral to a local prenatal education class. Begin classes no later than the beginning of month 6 of your pregnancy.  Ask for help if you have counseling or nutritional needs during pregnancy. Your health care provider can  offer advice or refer you to specialists for help with various needs.  Do not use hot tubs, steam rooms, or saunas.  Do not douche or use tampons or scented sanitary pads.  Do not cross your legs for long periods of time.  Avoid cat litter boxes and soil used by cats. These carry germs that can cause birth defects in the baby and possibly loss of the fetus by miscarriage or stillbirth.  Avoid all smoking, herbs, alcohol, and unprescribed drugs. Chemicals in these products can affect the formation and growth of the baby.  Do not use any products that contain nicotine or tobacco, such as cigarettes and e-cigarettes. If you need help quitting, ask your health care provider.  Visit your dentist if you have not gone yet during your pregnancy. Use a soft toothbrush to brush your teeth and be gentle when you floss. Contact a  health care provider if:  You have dizziness.  You have mild pelvic cramps, pelvic pressure, or nagging pain in the abdominal area.  You have persistent nausea, vomiting, or diarrhea.  You have a bad smelling vaginal discharge.  You have pain when you urinate. Get help right away if:  You have a fever.  You are leaking fluid from your vagina.  You have spotting or bleeding from your vagina.  You have severe abdominal cramping or pain.  You have rapid weight gain or weight loss.  You have shortness of breath with chest pain.  You notice sudden or extreme swelling of your face, hands, ankles, feet, or legs.  You have not felt your baby move in over an hour.  You have severe headaches that do not go away when you take medicine.  You have vision changes. Summary  The second trimester is from week 14 through week 27 (months 4 through 6). It is also a time when the fetus is growing rapidly.  Your body goes through many changes during pregnancy. The changes vary from woman to woman.  Avoid all smoking, herbs, alcohol, and unprescribed drugs. These chemicals affect the formation and growth your baby.  Do not use any tobacco products, such as cigarettes, chewing tobacco, and e-cigarettes. If you need help quitting, ask your health care provider.  Contact your health care provider if you have any questions. Keep all prenatal visits as told by your health care provider. This is important. This information is not intended to replace advice given to you by your health care provider. Make sure you discuss any questions you have with your health care provider.

## 2019-03-30 NOTE — Progress Notes (Signed)
LOW-RISK PREGNANCY VISIT Patient name: Audrey Matthews           MRN [id number]  Date of birth: 06/15/2000 Chief Complaint:   Prenatal visit  History of Present Illness:   Audrey Matthews is a 19 y.o. G1P0 female at [redacted]w[redacted]d with an Estimated Date of Delivery: 08/23/19 being seen today for ongoing management of a low-risk pregnancy. (now High risk d/t short cx) Today she reports leaking colostrum, nipples cracking some.  Using pantyliners in bra. May use coconut oil.  .  .   . denies leaking of fluid. Seen in [facility] 12/22 for pressure, better now. Review of Systems:   Pertinent items are noted in HPI Denies abnormal vaginal discharge w/ itching/odor/irritation, headaches, visual changes, shortness of breath, chest pain, abdominal pain, severe nausea/vomiting, or problems with urination or bowel movements unless otherwise stated above. Pertinent History Reviewed:  Reviewed past medical,surgical, social, obstetrical and family history.  Reviewed problem list, medications and allergies. Physical Assessment:  There were no vitals filed for this visit.There is no height or weight on file to calculate BMI.                   Physical Examination:              General appearance: Well appearing, and in no distress             Mental status: Alert, oriented to person, place, and time             Skin: Warm & dry             Cardiovascular: Normal heart rate noted             Respiratory: Normal respiratory effort, no distress             Abdomen: Soft, gravid, nontender             Pelvic: deferred.  Cx length 2.1cms w/pressure .  Seen in [facility] 12/22 and cx was 2.27cms/ closed./                             Extremities:    Fetal Status:         [place] VV/OH:60+7 wks,cephalic,short cervix,cx length 2.1 cm with and w/o pressure,anterior placenta gr 0,svp of fluid 4.6 cm,normal ovaries,fhr 161 bpm,LVEICF 1.5 mm,EFW 286 g 55%,anatomy complete        Electronically signed by Silver Huguenin at 03/30/2019 5:16  PM   Appointment on 03/30/2019     Detailed Report    Note shared with patient  Chaperone: n/a    No results found for this or any previous visit (from the past 24 hour(s)).  Assessment & Plan:  1) Low-risk pregnancy G1P0 at [redacted]w[redacted]d with an Estimated Date of Delivery: 08/23/19   2) Short cervix, start prometrium 200mg  PV q hs.  Recheck in 2 weeks   Meds:       Meds ordered this encounter  Medications  . progesterone (PROMETRIUM) 200 MG capsule    Sig: Place 1 capsule (200 mg total) vaginally daily.    Dispense:  30 capsule    Refill:  4    Order Specific Question:   Supervising Provider    Answer:   Florian Buff [[id number]]   Labs/procedures today: anatomy scan   Next visit: prefers will be in person for [place]    Reviewed: Preterm labor symptoms and general obstetric precautions including  but not limited to vaginal bleeding, contractions, leaking of fluid and fetal movement were reviewed in detail with the patient.  All questions were answered. Has home bp cuff.. Check bp weekly, let us know if >140/90.   Follow-up: 2 weeks cx length/HROB  No orders of the defined types were placed in this encounter.  [name] DNP, CNM 03/30/2019 4:38 PM

## 2019-03-30 NOTE — Progress Notes (Signed)
[place] TA/TV:19+1 wks,cephalic,short cervix,cx length 2.1 cm with and w/o pressure,anterior placenta gr 0,svp of fluid 4.6 cm,normal ovaries,fhr 161 bpm,LVEICF 1.5 mm,EFW 286 g 55%,anatomy complete

## 2019-03-30 NOTE — Progress Notes (Unsigned)
   LOW-RISK PREGNANCY VISIT Patient name: Audrey Matthews MRN [id number]  Date of birth: 11-18-2000 Chief Complaint:   No chief complaint on file.  History of Present Illness:   Audrey Matthews is a 19 y.o. G1P0 female at [redacted]w[redacted]d with an Estimated Date of Delivery: 08/23/19 being seen today for ongoing management of a low-risk pregnancy. (now High risk d/t short cx) Today she reports leaking colostrum, nipples cracking some.  Using pantyliners in bra. May use coconut oil.  .  .   . denies leaking of fluid. Seen in [facility] 12/22 for pressure, better now. Review of Systems:   Pertinent items are noted in HPI Denies abnormal vaginal discharge w/ itching/odor/irritation, headaches, visual changes, shortness of breath, chest pain, abdominal pain, severe nausea/vomiting, or problems with urination or bowel movements unless otherwise stated above. Pertinent History Reviewed:  Reviewed past medical,surgical, social, obstetrical and family history.  Reviewed problem list, medications and allergies. Physical Assessment:  There were no vitals filed for this visit.There is no height or weight on file to calculate BMI.        Physical Examination:   General appearance: Well appearing, and in no distress  Mental status: Alert, oriented to person, place, and time  Skin: Warm & dry  Cardiovascular: Normal heart rate noted  Respiratory: Normal respiratory effort, no distress  Abdomen: Soft, gravid, nontender  Pelvic: deferred.  Cx length 2.1cms w/pressure .  Seen in [facility] 12/22 and cx was 2.27cms/ closed./        Extremities:    Fetal Status:         *** Chaperone: n/a    No results found for this or any previous visit (from the past 24 hour(s)).  Assessment & Plan:  1) Low-risk pregnancy G1P0 at [redacted]w[redacted]d with an Estimated Date of Delivery: 08/23/19   2) Short cervix, start prometrium 200mg  PV q hs.  Recheck in 2 weeks   Meds:  Meds ordered this encounter  Medications  . progesterone (PROMETRIUM) 200 MG capsule     Sig: Place 1 capsule (200 mg total) vaginally daily.    Dispense:  30 capsule    Refill:  4    Order Specific Question:   Supervising Provider    Answer:   [[id number]]   Labs/procedures today: anatomy scan   Next visit: prefers will be in person for Audrey Matthews    Reviewed: Preterm labor symptoms and general obstetric precautions including but not limited to vaginal bleeding, contractions, leaking of fluid and fetal movement were reviewed in detail with the patient.  All questions were answered. Has home bp cuff.. Check bp weekly, let us know if >140/90.   Follow-up: No follow-ups on file.  No orders of the defined types were placed in this encounter.  [place] DNP, CNM 03/30/2019 4:38 PM

## 2019-04-03 ENCOUNTER — Other Ambulatory Visit: Payer: Medicaid Other

## 2019-04-03 ENCOUNTER — Encounter: Payer: Medicaid Other | Admitting: Advanced Practice Midwife

## 2019-04-13 ENCOUNTER — Other Ambulatory Visit: Payer: Self-pay

## 2019-04-13 ENCOUNTER — Inpatient Hospital Stay (HOSPITAL_COMMUNITY)
Admission: AD | Admit: 2019-04-13 | Discharge: 2019-04-14 | DRG: 818 | Disposition: A | Discharge status: Home / Self Care | Payer: Medicaid Other | Attending: Obstetrics & Gynecology | Admitting: Obstetrics & Gynecology

## 2019-04-13 ENCOUNTER — Ambulatory Visit (INDEPENDENT_AMBULATORY_CARE_PROVIDER_SITE_OTHER): Payer: Medicaid Other | Admitting: Obstetrics & Gynecology

## 2019-04-13 ENCOUNTER — Other Ambulatory Visit: Payer: Self-pay | Admitting: Advanced Practice Midwife

## 2019-04-13 ENCOUNTER — Encounter (HOSPITAL_COMMUNITY): Payer: Self-pay | Admitting: Obstetrics & Gynecology

## 2019-04-13 ENCOUNTER — Ambulatory Visit (INDEPENDENT_AMBULATORY_CARE_PROVIDER_SITE_OTHER): Payer: Medicaid Other

## 2019-04-13 ENCOUNTER — Encounter: Payer: Self-pay | Admitting: Obstetrics & Gynecology

## 2019-04-13 VITALS — BP 133/83 | HR 96 | Wt 158.0 lb

## 2019-04-13 DIAGNOSIS — Z3402 Encounter for supervision of normal first pregnancy, second trimester: Secondary | ICD-10-CM

## 2019-04-13 DIAGNOSIS — O26872 Cervical shortening, second trimester: Secondary | ICD-10-CM | POA: Diagnosis present

## 2019-04-13 DIAGNOSIS — F840 Autistic disorder: Secondary | ICD-10-CM | POA: Diagnosis present

## 2019-04-13 DIAGNOSIS — O3432 Maternal care for cervical incompetence, second trimester: Secondary | ICD-10-CM | POA: Diagnosis present

## 2019-04-13 DIAGNOSIS — Z3A21 21 weeks gestation of pregnancy: Secondary | ICD-10-CM

## 2019-04-13 DIAGNOSIS — O26892 Other specified pregnancy related conditions, second trimester: Secondary | ICD-10-CM | POA: Diagnosis not present

## 2019-04-13 DIAGNOSIS — O26879 Cervical shortening, unspecified trimester: Secondary | ICD-10-CM

## 2019-04-13 DIAGNOSIS — O99512 Diseases of the respiratory system complicating pregnancy, second trimester: Secondary | ICD-10-CM | POA: Diagnosis present

## 2019-04-13 DIAGNOSIS — F603 Borderline personality disorder: Secondary | ICD-10-CM | POA: Diagnosis present

## 2019-04-13 DIAGNOSIS — Z20822 Contact with and (suspected) exposure to covid-19: Secondary | ICD-10-CM | POA: Diagnosis present

## 2019-04-13 DIAGNOSIS — N883 Incompetence of cervix uteri: Secondary | ICD-10-CM | POA: Diagnosis present

## 2019-04-13 DIAGNOSIS — Z362 Encounter for other antenatal screening follow-up: Secondary | ICD-10-CM | POA: Diagnosis not present

## 2019-04-13 DIAGNOSIS — Z331 Pregnant state, incidental: Secondary | ICD-10-CM

## 2019-04-13 DIAGNOSIS — O99342 Other mental disorders complicating pregnancy, second trimester: Secondary | ICD-10-CM | POA: Diagnosis present

## 2019-04-13 DIAGNOSIS — F4312 Post-traumatic stress disorder, chronic: Secondary | ICD-10-CM | POA: Diagnosis present

## 2019-04-13 DIAGNOSIS — Z1379 Encounter for other screening for genetic and chromosomal anomalies: Secondary | ICD-10-CM

## 2019-04-13 DIAGNOSIS — J45909 Unspecified asthma, uncomplicated: Secondary | ICD-10-CM | POA: Diagnosis present

## 2019-04-13 DIAGNOSIS — Z1389 Encounter for screening for other disorder: Secondary | ICD-10-CM

## 2019-04-13 DIAGNOSIS — O0990 Supervision of high risk pregnancy, unspecified, unspecified trimester: Secondary | ICD-10-CM

## 2019-04-13 LAB — URINALYSIS, ROUTINE W REFLEX MICROSCOPIC
Bilirubin Urine: NEGATIVE
Glucose, UA: NEGATIVE mg/dL
Hgb urine dipstick: NEGATIVE
Ketones, ur: 20 mg/dL — AB
Nitrite: NEGATIVE
Protein, ur: NEGATIVE mg/dL
Specific Gravity, Urine: 1.026 (ref 1.005–1.030)
pH: 5 (ref 5.0–8.0)

## 2019-04-13 LAB — CBC WITH DIFFERENTIAL/PLATELET
Abs Immature Granulocytes: 0.09 10*3/uL — ABNORMAL HIGH (ref 0.00–0.07)
Basophils Absolute: 0 10*3/uL (ref 0.0–0.1)
Basophils Relative: 0 %
Eosinophils Absolute: 0.1 10*3/uL (ref 0.0–0.5)
Eosinophils Relative: 1 %
HCT: 34.1 % — ABNORMAL LOW (ref 36.0–46.0)
Hemoglobin: 11.5 g/dL — ABNORMAL LOW (ref 12.0–15.0)
Immature Granulocytes: 1 %
Lymphocytes Relative: 17 %
Lymphs Abs: 2.1 10*3/uL (ref 0.7–4.0)
MCH: 29.7 pg (ref 26.0–34.0)
MCHC: 33.7 g/dL (ref 30.0–36.0)
MCV: 88.1 fL (ref 80.0–100.0)
Monocytes Absolute: 1.1 10*3/uL — ABNORMAL HIGH (ref 0.1–1.0)
Monocytes Relative: 9 %
Neutro Abs: 9.2 10*3/uL — ABNORMAL HIGH (ref 1.7–7.7)
Neutrophils Relative %: 72 %
Platelets: 271 10*3/uL (ref 150–400)
RBC: 3.87 MIL/uL (ref 3.87–5.11)
RDW: 12.9 % (ref 11.5–15.5)
WBC: 12.7 10*3/uL — ABNORMAL HIGH (ref 4.0–10.5)
nRBC: 0 % (ref 0.0–0.2)

## 2019-04-13 LAB — BASIC METABOLIC PANEL
Anion gap: 9 (ref 5–15)
BUN: 8 mg/dL (ref 6–20)
CO2: 22 mmol/L (ref 22–32)
Calcium: 9.2 mg/dL (ref 8.9–10.3)
Chloride: 102 mmol/L (ref 98–111)
Creatinine, Ser: 0.49 mg/dL (ref 0.44–1.00)
GFR calc Af Amer: >60 mL/min (ref >60)
GFR calc non Af Amer: >60 mL/min (ref >60)
Glucose, Bld: 76 mg/dL (ref 70–99)
Potassium: 3.6 mmol/L (ref 3.5–5.1)
Sodium: 133 mmol/L — ABNORMAL LOW (ref 135–145)

## 2019-04-13 LAB — POCT URINALYSIS DIPSTICK OB
Blood, UA: NEGATIVE
Glucose, UA: NEGATIVE
Ketones, UA: NEGATIVE
Leukocytes, UA: NEGATIVE
Nitrite, UA: NEGATIVE

## 2019-04-13 LAB — TYPE AND SCREEN
ABO/RH(D): O NEG
Antibody Screen: NEGATIVE

## 2019-04-13 LAB — SARS CORONAVIRUS 2 (TAT 6-24 HRS): SARS Coronavirus 2: NEGATIVE

## 2019-04-13 LAB — WET PREP, GENITAL
Clue Cells Wet Prep HPF POC: NONE SEEN
Sperm: NONE SEEN
Trich, Wet Prep: NONE SEEN
Yeast Wet Prep HPF POC: NONE SEEN

## 2019-04-13 LAB — ABO/RH: ABO/RH(D): O NEG

## 2019-04-13 MED ORDER — PROGESTERONE MICRONIZED 100 MG PO CAPS: 200.0000 mg | ORAL_CAPSULE | Freq: Every day | ORAL | Status: DC

## 2019-04-13 MED ORDER — INDOMETHACIN 25 MG PO CAPS: 25.0000 mg | ORAL_CAPSULE | Freq: Four times a day (QID) | ORAL | Status: DC

## 2019-04-13 MED ORDER — ZOLPIDEM TARTRATE 5 MG PO TABS: 5.0000 mg | ORAL_TABLET | Freq: Every evening | ORAL | Status: DC | PRN

## 2019-04-13 MED ORDER — DOCUSATE SODIUM 100 MG PO CAPS: 100.0000 mg | ORAL_CAPSULE | Freq: Every day | ORAL | Status: DC

## 2019-04-13 MED ORDER — PRENATAL MULTIVITAMIN CH: 1.0000 | ORAL_TABLET | Freq: Every day | ORAL | Status: DC

## 2019-04-13 MED ORDER — INDOMETHACIN 25 MG PO CAPS
50.0000 mg | ORAL_CAPSULE | Freq: Once | ORAL | Status: AC
  Administered 2019-04-13: 50 mg via ORAL
  Filled 2019-04-13: qty 2

## 2019-04-13 MED ORDER — INDOMETHACIN 25 MG PO CAPS: 50.0000 mg | ORAL_CAPSULE | Freq: Once | ORAL | Status: DC

## 2019-04-13 MED ORDER — ACETAMINOPHEN 500 MG PO TABS: 500.0000 mg | ORAL_TABLET | Freq: Four times a day (QID) | ORAL | Status: DC | PRN

## 2019-04-13 MED ORDER — PANTOPRAZOLE SODIUM 40 MG PO TBEC: 40.0000 mg | DELAYED_RELEASE_TABLET | Freq: Every day | ORAL | Status: DC

## 2019-04-13 MED ORDER — CALCIUM CARBONATE ANTACID 500 MG PO CHEW: 2.0000 | CHEWABLE_TABLET | ORAL | Status: DC | PRN

## 2019-04-13 MED ORDER — ALBUTEROL SULFATE (2.5 MG/3ML) 0.083% IN NEBU: 3.0000 mL | INHALATION_SOLUTION | RESPIRATORY_TRACT | Status: DC | PRN

## 2019-04-13 MED ORDER — DOCUSATE SODIUM 50 MG PO CAPS: 100.0000 mg | ORAL_CAPSULE | Freq: Every day | ORAL | Status: DC

## 2019-04-13 MED ORDER — ACETAMINOPHEN 325 MG PO TABS: 650.0000 mg | ORAL_TABLET | ORAL | Status: DC | PRN

## 2019-04-13 MED ORDER — SODIUM CHLORIDE 0.9 % IV SOLN: 3.0000 g | Freq: Three times a day (TID) | INTRAVENOUS | Status: DC

## 2019-04-13 MED ORDER — PROGESTERONE MICRONIZED 200 MG PO CAPS
200.0000 mg | ORAL_CAPSULE | Freq: Every day | ORAL | Status: DC
  Administered 2019-04-13: 200 mg via VAGINAL
  Filled 2019-04-13: qty 1

## 2019-04-13 MED ORDER — LACTATED RINGERS IV SOLN: INTRAVENOUS | Status: DC

## 2019-04-13 MED ORDER — SODIUM CHLORIDE 0.9 % IV SOLN
3.0000 g | Freq: Three times a day (TID) | INTRAVENOUS | Status: DC
  Administered 2019-04-13: 3 g via INTRAVENOUS
  Filled 2019-04-13: qty 8

## 2019-04-13 NOTE — H&P (Signed)
Audrey Matthews is a 19 y.o.G1P0 Estimated Date of Delivery: 08/23/19 5624w1d  female presenting for management of cervical incompetence with placement of a rescue cerclage tomorrow by Dr Judeth CornfieldShankar at 1030  Pt presented originally to [facility] 12/19 with complaint of pelvic pressure and CL was 2.27 cm.  Repeat CL was performed in our office 03/30/19 with CL 2.1 cm.  She was begun on prometrium 200 mg vaginally nightly and presented today for a repeat CL  On sonogram today her cervix is dynamic, it goes from closed with length to 1 cm dilated and no measurable cervix. The fluid in the cervix is not turbid and there are no fetal parts present either.  The external os is closed  I discussed the case with Dr Judeth CornfieldShankar who agrees this patient needs a rescue cerclage and preparations are made for that tomorrow at 1030.  I will admit her overnight for observation and bedrest.  The clinical situation is explained to the patient and she is aware of the condition and the management plan and agrees with it.  She understands she will need spinal anesthesia as well.   OB History    Gravida  1   Para      Term      Preterm      AB      Living        SAB      TAB      Ectopic      Multiple      Live Births             Past Medical History:  Diagnosis Date  . ADHD (attention deficit hyperactivity disorder)   . Anxiety   . Asthma   . Autism   . Depression   . PTSD (post-traumatic stress disorder)    Past Surgical History:  Procedure Laterality Date  . CAUTERIZE INNER NOSE    . DENTAL SURGERY    . MOUTH SURGERY     Family History: family history includes ADD / ADHD in her mother; Anxiety disorder in her mother; Depression in her maternal grandmother, mother, and sister; Drug abuse in her father. Social History:  reports that she has never smoked. She has never used smokeless tobacco. She reports previous alcohol use. She reports that she does not use drugs.     Maternal Diabetes:  Genetic  Screening: pending 2nd IT Maternal Ultrasounds/Referrals:  Fetal Ultrasounds or other Referrals:   Maternal Substance Abuse:  No Significant Maternal Medications:    Current Outpatient Medications:  .  acetaminophen (TYLENOL) 500 MG tablet, Take 500 mg by mouth every 6 (six) hours as needed for moderate pain., Disp: , Rfl:  .  albuterol (VENTOLIN HFA) 108 (90 Base) MCG/ACT inhaler, SMARTSIG:2 Puff(s) Via Inhaler Daily, Disp: , Rfl:  .  omeprazole (PRILOSEC) 20 MG capsule, Take 1 capsule (20 mg total) by mouth daily., Disp: 30 capsule, Rfl: 6 .  prenatal vitamin w/FE, FA (PRENATAL 1 + 1) 27-1 MG TABS tablet, Take 1 tablet by mouth daily at 12 noon., Disp: 30 tablet, Rfl: 12 .  progesterone (PROMETRIUM) 200 MG capsule, Place 1 capsule (200 mg total) vaginally daily., Disp: 30 capsule, Rfl: 4 .  Blood Pressure Monitor MISC, For regular home bp monitoring during pregnancy (Patient not taking: Reported on 04/13/2019), Disp: 1 each, Rfl: 0 .  Elastic Bandages & Supports (COMFORT FIT MATERNITY SUPP SM) MISC, 1 Units by Does not apply route daily as needed. (Patient not taking: Reported on  04/13/2019), Disp: 1 each, Rfl: 0   Significant Maternal Lab Results:  None Other Comments:    Review of Systems   Review of Systems  Constitutional: Negative for fever, chills, weight loss, malaise/fatigue and diaphoresis.  HENT: Negative for hearing loss, ear pain, nosebleeds, congestion, sore throat, neck pain, tinnitus and ear discharge.   Eyes: Negative for blurred vision, double vision, photophobia, pain, discharge and redness.  Respiratory: Negative for cough, hemoptysis, sputum production, shortness of breath, wheezing and stridor.   Cardiovascular: Negative for chest pain, palpitations, orthopnea, claudication, leg swelling and PND.  Gastrointestinal: negative for abdominal pain. Negative for heartburn, nausea, vomiting, diarrhea, constipation, blood in stool and melena.  Genitourinary: Negative for  dysuria, urgency, frequency, hematuria and flank pain.  Musculoskeletal: Negative for myalgias, back pain, joint pain and falls.  Skin: Negative for itching and rash.  Neurological: Negative for dizziness, tingling, tremors, sensory change, speech change, focal weakness, seizures, loss of consciousness, weakness and headaches.  Endo/Heme/Allergies: Negative for environmental allergies and polydipsia. Does not bruise/bleed easily.  Psychiatric/Behavioral: Negative for depression, suicidal ideas, hallucinations, memory loss and substance abuse. The patient is not nervous/anxious and does not have insomnia.        Blood pressure 133/83, pulse 96, weight 158 lb (71.7 kg), last menstrual period 11/16/2018. Exam Physical Exam  Physical Exam  Vitals reviewed. Constitutional: She is oriented to person, place, and time. She appears well-developed and well-nourished.  HENT:  Head: Normocephalic and atraumatic.  Right Ear: External ear normal.  Left Ear: External ear normal.  Nose: Nose normal.  Cardiovascular: Normal rate, regular rhythm, normal heart sounds and intact distal pulses.  Exam reveals no gallop and no friction rub.   No murmur heard. Respiratory: Effort normal and breath sounds normal. No respiratory distress. She has no wheezes. She has no rales. She exhibits no tenderness.  GI: Soft. Bowel sounds are normal. She exhibits no distension and no mass. There is no tenderness. There is no rebound and no guarding.  Genitourinary:  Pelvic deferred  Per sonogram Musculoskeletal: Normal range of motion. She exhibits no edema and no tenderness.  Neurological: She is alert and oriented to person, place, and time. She has normal reflexes. She displays normal reflexes. No cranial nerve deficit. She exhibits normal muscle tone. Coordination normal.  Skin: Skin is warm and dry. No rash noted. No erythema. No pallor.  Psychiatric: She has a normal mood and affect. Her behavior is normal. Judgment  and thought content normal.   US OB Comp + 14 Wk  Result Date: 04/03/2019 SECOND TRIMESTER ANATOMY SONOGRAM Audrey Matthews is in the office for second trimester anatomy sonogram. She is a 19 y.o. year old G1P0 with Estimated Date of Delivery: 08/23/19 by LMP now at  [redacted]w[redacted]d weeks gestation. Thus far the pregnancy has been complicated by short cx. GESTATION: SINGLETON PRESENTATION: cephalic FETAL ACTIVITY:          Heart rate         161          The fetus is active. AMNIOTIC FLUID: The amniotic fluid volume is  normal, SDP : 4.6 cm. PLACENTA LOCALIZATION:  anterior GRADE 0 CERVIX:TV Measures 2.1 cm w and w/o pressure ADNEXA: The ovaries are normal. GESTATIONAL AGE AND  BIOMETRICS: Gestational criteria: Estimated Date of Delivery: 08/23/19 by LMP now at [redacted]w[redacted]d Previous Scans:3          BIPARIETAL DIAMETER           4.68 cm  20 weeks HEAD CIRCUMFERENCE           17.21 cm         19+5 weeks ABDOMINAL CIRCUMFERENCE           13.55 cm         19 weeks FEMUR LENGTH           3.07 cm         19+3 weeks                                                       AVERAGE EGA(BY THIS SCAN):  19+4 weeks                                                 ESTIMATED FETAL WEIGHT:       286  grams, 55 % ANATOMICAL SURVEY                                                                            COMMENTS CEREBRAL VENTRICLES yes normal  CHOROID PLEXUS yes normal  CEREBELLUM yes normal  CISTERNA MAGNA yes normal  NUCHAL REGION yes normal  ORBITS yes normal  NASAL BONE yes normal  NOSE/LIP yes normal  FACIAL PROFILE yes normal  4 CHAMBERED HEART yes normal LVEICF 1.5 mm OUTFLOW TRACTS yes normal  DIAPHRAGM yes normal  STOMACH yes normal  RENAL REGION yes normal  BLADDER yes normal  CORD INSERTION yes normal  3 VESSEL CORD yes normal  SPINE yes normal  ARMS/HANDS yes normal  LEGS/FEET yes normal  GENITALIA yes normal      SUSPECTED ABNORMALITIES:  short cervix length 2.1 cm,LVEICF QUALITY OF SCAN: satisfactory TECHNICIAN COMMENTS: [place]  IO/XB:35+3 wks,cephalic,short cervix,cx length 2.1 cm with and w/o pressure,anterior placenta gr 0,svp of fluid 4.6 cm,normal ovaries,fhr 161 bpm,LVEICF 1.5 mm,EFW 286 g 55%,anatomy complete A copy of this report including all images has been saved and backed up to a second source for retrieval if needed. All measures and details of the anatomical scan, placentation, fluid volume and pelvic anatomy are contained in that report. Amber Heide Guile 03/30/2019 5:22 PM Clinical Impression and recommendations: I have reviewed the sonogram results above, combined with the patient's current clinical course, below are my impressions and any appropriate recommendations for management based on the sonographic findings. 1.  G1P0 Estimated Date of Delivery: 08/23/19 by  LMP, early ultrasound and confirmed by today's sonographic dating 2.  Normal fetal sonographic findings, specifically normal detailed anatomical evaluation,      no abnormalities noted 3.  Normal general sonographic findings, cervical length is stable, although still in pathologically short range Pt has been prescribe prometrium Recommend routine prenatal care based on this sonogram or as clinically indicated Florian Buff 04/03/2019 9:43 AM   US OB Limited  Result Date: 04/13/2019 FOLLOW UP SONOGRAM Audrey Matthews is in the office for a follow up sonogram  for cx length. She is a 19 y.o. year old G1P0 with Estimated Date of Delivery: 08/23/19 by LMP now at  3955w1d weeks gestation. Thus far the pregnancy has been complicated by short cervix. GESTATION: SINGLETON PRESENTATION: cephalic FETAL ACTIVITY:          Heart rate         157          The fetus is active. AMNIOTIC FLUID: The amniotic fluid volume is  normal  svp 4.5 cm PLACENTA LOCALIZATION:  anterior GRADE 0 CERVIX: cervix appears open with no measurable cervix ADNEXA: The ovaries are normal. Previous Scans:4                                                 ANATOMICAL SURVEY                                                                             COMMENTS CEREBRAL VENTRICLES yes normal  CHOROID PLEXUS yes normal  CEREBELLUM yes normal  CISTERNA MAGNA yes normal                      4 CHAMBERED HEART yes normal LVEICF OUTFLOW TRACTS yes normal  DIAPHRAGM yes normal  STOMACH yes normal  RENAL REGION yes normal  BLADDER yes normal                      GENITALIA        SUSPECTED ABNORMALITIES:  yes QUALITY OF SCAN: satisfactory TECHNICIAN COMMENTS: US 21+1 wks,cephalic,anterior placenta gr 0,svp of fluid 4.5 cm,fhr 157 bpm,normal ovaries,LVEICF N/C,cervix appears open with no measurable cervix, dynamic cervix with bulging membranes, funneling length 1.85 cm,Dr Despina Hidden reviewed images with patient A copy of this report including all images has been saved and backed up to a second source for retrieval if needed. All measures and details of the anatomical scan, placentation, fluid volume and pelvic anatomy are contained in that report. Amber Flora LippsJ Carl 04/13/2019 2:59 PM Clinical Impression and recommendations: I have reviewed the sonogram results above, combined with the patient's current clinical course, below are my impressions and any appropriate recommendations for management based on the sonographic findings. 1.  G1P0 Estimated Date of Delivery: 08/23/19 by serial sonographic evaluations 2.  Fetal sonographic surveillance findings: Cervix dilated 1cm internally now with no measurable length with fluid and membranes to external os, no turbidity in the fluid in the cervix and no fetal parts presetn 3.  Normal general sonographic findings Will discuss timing of rescue cerclage with Dr Judeth CornfieldShankar. Amaryllis DykeLuther H  04/13/2019 3:00 PM   US OB Transvaginal  Result Date: 04/13/2019 FOLLOW UP SONOGRAM Audrey Matthews is in the office for a follow up sonogram for cx length. She is a 19 y.o. year old G1P0 with Estimated Date of Delivery: 08/23/19 by LMP now at  555w1d weeks gestation. Thus far the pregnancy has been complicated by short cervix. GESTATION:  SINGLETON PRESENTATION: cephalic FETAL ACTIVITY:          Heart rate  157          The fetus is active. AMNIOTIC FLUID: The amniotic fluid volume is  normal  svp 4.5 cm PLACENTA LOCALIZATION:  anterior GRADE 0 CERVIX: cervix appears open with no measurable cervix ADNEXA: The ovaries are normal. Previous Scans:4                                                 ANATOMICAL SURVEY                                                                            COMMENTS CEREBRAL VENTRICLES yes normal  CHOROID PLEXUS yes normal  CEREBELLUM yes normal  CISTERNA MAGNA yes normal                      4 CHAMBERED HEART yes normal LVEICF OUTFLOW TRACTS yes normal  DIAPHRAGM yes normal  STOMACH yes normal  RENAL REGION yes normal  BLADDER yes normal                      GENITALIA        SUSPECTED ABNORMALITIES:  yes QUALITY OF SCAN: satisfactory TECHNICIAN COMMENTS: [place] 21+1 wks,cephalic,anterior placenta gr 0,svp of fluid 4.5 cm,fhr 157 bpm,normal ovaries,LVEICF N/C,cervix appears open with no measurable cervix, dynamic cervix with bulging membranes, funneling length 1.85 cm,Dr Despina Hidden reviewed images with patient A copy of this report including all images has been saved and backed up to a second source for retrieval if needed. All measures and details of the anatomical scan, placentation, fluid volume and pelvic anatomy are contained in that report. Amber Flora Lipps 04/13/2019 2:59 PM Clinical Impression and recommendations: I have reviewed the sonogram results above, combined with the patient's current clinical course, below are my impressions and any appropriate recommendations for management based on the sonographic findings. 1.  G1P0 Estimated Date of Delivery: 08/23/19 by serial sonographic evaluations 2.  Fetal sonographic surveillance findings: Cervix dilated 1cm internally now with no measurable length with fluid and membranes to external os, no turbidity in the fluid in the cervix and no fetal parts presetn 3.  Normal general  sonographic findings Will discuss timing of rescue cerclage with Dr Judeth Cornfield. Amaryllis Dyke  04/13/2019 3:00 PM   US OB Transvaginal  Result Date: 04/03/2019 SECOND TRIMESTER ANATOMY SONOGRAM Audrey Matthews is in the office for second trimester anatomy sonogram. She is a 19 y.o. year old G1P0 with Estimated Date of Delivery: 08/23/19 by LMP now at  [redacted]w[redacted]d weeks gestation. Thus far the pregnancy has been complicated by short cx. GESTATION: SINGLETON PRESENTATION: cephalic FETAL ACTIVITY:          Heart rate         161          The fetus is active. AMNIOTIC FLUID: The amniotic fluid volume is  normal, SDP : 4.6 cm. PLACENTA LOCALIZATION:  anterior GRADE 0 CERVIX:TV Measures 2.1 cm w and w/o pressure ADNEXA: The ovaries are normal. GESTATIONAL AGE AND  BIOMETRICS: Gestational criteria: Estimated Date of Delivery:  08/23/19 by LMP now at [redacted]w[redacted]d Previous Scans:3          BIPARIETAL DIAMETER           4.68 cm         20 weeks HEAD CIRCUMFERENCE           17.21 cm         19+5 weeks ABDOMINAL CIRCUMFERENCE           13.55 cm         19 weeks FEMUR LENGTH           3.07 cm         19+3 weeks                                                       AVERAGE EGA(BY THIS SCAN):  19+4 weeks                                                 ESTIMATED FETAL WEIGHT:       286  grams, 55 % ANATOMICAL SURVEY                                                                            COMMENTS CEREBRAL VENTRICLES yes normal  CHOROID PLEXUS yes normal  CEREBELLUM yes normal  CISTERNA MAGNA yes normal  NUCHAL REGION yes normal  ORBITS yes normal  NASAL BONE yes normal  NOSE/LIP yes normal  FACIAL PROFILE yes normal  4 CHAMBERED HEART yes normal LVEICF 1.5 mm OUTFLOW TRACTS yes normal  DIAPHRAGM yes normal  STOMACH yes normal  RENAL REGION yes normal  BLADDER yes normal  CORD INSERTION yes normal  3 VESSEL CORD yes normal  SPINE yes normal  ARMS/HANDS yes normal  LEGS/FEET yes normal  GENITALIA yes normal      SUSPECTED ABNORMALITIES:  short cervix  length 2.1 cm,LVEICF QUALITY OF SCAN: satisfactory TECHNICIAN COMMENTS: [place] TA/TV:19+1 wks,cephalic,short cervix,cx length 2.1 cm with and w/o pressure,anterior placenta gr 0,svp of fluid 4.6 cm,normal ovaries,fhr 161 bpm,LVEICF 1.5 mm,EFW 286 g 55%,anatomy complete A copy of this report including all images has been saved and backed up to a second source for retrieval if needed. All measures and details of the anatomical scan, placentation, fluid volume and pelvic anatomy are contained in that report. Amber Flora Lipps 03/30/2019 5:22 PM Clinical Impression and recommendations: I have reviewed the sonogram results above, combined with the patient's current clinical course, below are my impressions and any appropriate recommendations for management based on the sonographic findings. 1.  G1P0 Estimated Date of Delivery: 08/23/19 by  LMP, early ultrasound and confirmed by today's sonographic dating 2.  Normal fetal sonographic findings, specifically normal detailed anatomical evaluation,      no abnormalities noted 3.  Normal general sonographic findings, cervical length is stable, although still in pathologically short range Pt has been prescribe prometrium Recommend routine  prenatal care based on this sonogram or as clinically indicated Lazaro Arms 04/03/2019 9:43 AM    Prenatal labs: ABO, Rh: O/Negative/-- (11/19 1112) Antibody: Negative (11/19 1112) Rubella: <0.90 (11/19 1112) RPR: Non Reactive (11/19 1112)  HBsAg: Negative (11/19 1112)  HIV: Non Reactive (11/19 1112)  GBS:n/a     Assessment/Plan: [redacted]w[redacted]d Estimated Date of Delivery: 08/23/19  Cervical incompetence, failure of prometrium therapy alone  Plan for rescue cerclage tomorrow at 1030 by Dr Judeth Cornfield Who is aware and agrees with plan  Amaryllis Dyke  04/13/2019, 3:19 PM

## 2019-04-13 NOTE — Anesthesia Preprocedure Evaluation (Addendum)
Anesthesia Evaluation  Patient identified by MRN, date of birth, ID band Patient awake    Reviewed: Allergy & Precautions, NPO status , Patient's Chart, lab work & pertinent test results  Airway Mallampati: II  TM Distance: >3 FB Neck ROM: Full    Dental no notable dental hx. (+) Teeth Intact   Pulmonary asthma ,    Pulmonary exam normal breath sounds clear to auscultation       Cardiovascular Exercise Tolerance: Good negative cardio ROS Normal cardiovascular exam Rhythm:Regular Rate:Normal     Neuro/Psych Anxiety negative neurological ROS     GI/Hepatic negative GI ROS, Neg liver ROS,   Endo/Other  negative endocrine ROS  Renal/GU      Musculoskeletal negative musculoskeletal ROS (+)   Abdominal   Peds  Hematology hgb 11.9 plt 271   Anesthesia Other Findings   Reproductive/Obstetrics (+) Pregnancy                            Anesthesia Physical Anesthesia Plan  ASA: II  Anesthesia Plan: Spinal   Post-op Pain Management:    Induction: Intravenous  PONV Risk Score and Plan: Treatment may vary due to age or medical condition  Airway Management Planned: Nasal Cannula and Natural Airway  Additional Equipment: None  Intra-op Plan:   Post-operative Plan:   Informed Consent: I have reviewed the patients History and Physical, chart, labs and discussed the procedure including the risks, benefits and alternatives for the proposed anesthesia with the patient or authorized representative who has indicated his/her understanding and acceptance.     Dental advisory given  Plan Discussed with:   Anesthesia Plan Comments: (21 wk G1P0 for cerclage)       Anesthesia Quick Evaluation

## 2019-04-13 NOTE — Progress Notes (Signed)
Audrey Matthews is a 19 y.o.G1P0 Estimated Date of Delivery: 08/23/19 3845w1d  female presenting for management of cervical incompetence with placement of a rescue cerclage tomorrow by Dr Judeth CornfieldShankar at 1030  Pt presented originally to [facility] 12/19 with complaint of pelvic pressure and CL was 2.27 cm.  Repeat CL was performed in our office 03/30/19 with CL 2.1 cm.  She was begun on prometrium 200 mg vaginally nightly and presented today for a repeat CL  On sonogram today her cervix is dynamic, it goes from closed with length to 1 cm dilated and no measurable cervix. The fluid in the cervix is not turbid and there are no fetal parts present either.  The external os is closed  I discussed the case with Dr Judeth CornfieldShankar who agrees this patient needs a rescue cerclage and preparations are made for that tomorrow at 1030.  I will admit her overnight for observation and bedrest.  The clinical situation is explained to the patient and she is aware of the condition and the management plan and agrees with it.  She understands she will need spinal anesthesia as well.   OB History    Gravida  1   Para      Term      Preterm      AB      Living        SAB      TAB      Ectopic      Multiple      Live Births             Past Medical History:  Diagnosis Date  . ADHD (attention deficit hyperactivity disorder)   . Anxiety   . Asthma   . Autism   . Depression   . PTSD (post-traumatic stress disorder)    Past Surgical History:  Procedure Laterality Date  . CAUTERIZE INNER NOSE    . DENTAL SURGERY    . MOUTH SURGERY     Family History: family history includes ADD / ADHD in her mother; Anxiety disorder in her mother; Depression in her maternal grandmother, mother, and sister; Drug abuse in her father. Social History:  reports that she has never smoked. She has never used smokeless tobacco. She reports previous alcohol use. She reports that she does not use drugs.     Maternal Diabetes:  Genetic  Screening: pending 2nd IT Maternal Ultrasounds/Referrals:  Fetal Ultrasounds or other Referrals:   Maternal Substance Abuse:  No Significant Maternal Medications:    Current Outpatient Medications:  .  acetaminophen (TYLENOL) 500 MG tablet, Take 500 mg by mouth every 6 (six) hours as needed for moderate pain., Disp: , Rfl:  .  albuterol (VENTOLIN HFA) 108 (90 Base) MCG/ACT inhaler, SMARTSIG:2 Puff(s) Via Inhaler Daily, Disp: , Rfl:  .  omeprazole (PRILOSEC) 20 MG capsule, Take 1 capsule (20 mg total) by mouth daily., Disp: 30 capsule, Rfl: 6 .  prenatal vitamin w/FE, FA (PRENATAL 1 + 1) 27-1 MG TABS tablet, Take 1 tablet by mouth daily at 12 noon., Disp: 30 tablet, Rfl: 12 .  progesterone (PROMETRIUM) 200 MG capsule, Place 1 capsule (200 mg total) vaginally daily., Disp: 30 capsule, Rfl: 4 .  Blood Pressure Monitor MISC, For regular home bp monitoring during pregnancy (Patient not taking: Reported on 04/13/2019), Disp: 1 each, Rfl: 0 .  Elastic Bandages & Supports (COMFORT FIT MATERNITY SUPP SM) MISC, 1 Units by Does not apply route daily as needed. (Patient not taking: Reported on  04/13/2019), Disp: 1 each, Rfl: 0   Significant Maternal Lab Results:  None Other Comments:    Review of Systems   Review of Systems  Constitutional: Negative for fever, chills, weight loss, malaise/fatigue and diaphoresis.  HENT: Negative for hearing loss, ear pain, nosebleeds, congestion, sore throat, neck pain, tinnitus and ear discharge.   Eyes: Negative for blurred vision, double vision, photophobia, pain, discharge and redness.  Respiratory: Negative for cough, hemoptysis, sputum production, shortness of breath, wheezing and stridor.   Cardiovascular: Negative for chest pain, palpitations, orthopnea, claudication, leg swelling and PND.  Gastrointestinal:  for abdominal pain. Negative for heartburn, nausea, vomiting, diarrhea, constipation, blood in stool and melena.  Genitourinary: Negative for dysuria,  urgency, frequency, hematuria and flank pain.  Musculoskeletal: Negative for myalgias, back pain, joint pain and falls.  Skin: Negative for itching and rash.  Neurological: Negative for dizziness, tingling, tremors, sensory change, speech change, focal weakness, seizures, loss of consciousness, weakness and headaches.  Endo/Heme/Allergies: Negative for environmental allergies and polydipsia. Does not bruise/bleed easily.  Psychiatric/Behavioral: Negative for depression, suicidal ideas, hallucinations, memory loss and substance abuse. The patient is not nervous/anxious and does not have insomnia.        Blood pressure 133/83, pulse 96, weight 158 lb (71.7 kg), last menstrual period 11/16/2018. Exam Physical Exam  Physical Exam  Vitals reviewed. Constitutional: She is oriented to person, place, and time. She appears well-developed and well-nourished.  HENT:  Head: Normocephalic and atraumatic.  Right Ear: External ear normal.  Left Ear: External ear normal.  Nose: Nose normal.  Cardiovascular: Normal rate, regular rhythm, normal heart sounds and intact distal pulses.  Exam reveals no gallop and no friction rub.   No murmur heard. Respiratory: Effort normal and breath sounds normal. No respiratory distress. She has no wheezes. She has no rales. She exhibits no tenderness.  GI: Soft. Bowel sounds are normal. She exhibits no distension and no mass. There is no tenderness. There is no rebound and no guarding.  Genitourinary:  Pelvic deferred  Per sonogram Musculoskeletal: Normal range of motion. She exhibits no edema and no tenderness.  Neurological: She is alert and oriented to person, place, and time. She has normal reflexes. She displays normal reflexes. No cranial nerve deficit. She exhibits normal muscle tone. Coordination normal.  Skin: Skin is warm and dry. No rash noted. No erythema. No pallor.  Psychiatric: She has a normal mood and affect. Her behavior is normal. Judgment and  thought content normal.   US OB Comp + 14 Wk  Result Date: 04/03/2019 SECOND TRIMESTER ANATOMY SONOGRAM Audrey Matthews is in the office for second trimester anatomy sonogram. She is a 19 y.o. year old G1P0 with Estimated Date of Delivery: 08/23/19 by LMP now at  [redacted]w[redacted]d weeks gestation. Thus far the pregnancy has been complicated by short cx. GESTATION: SINGLETON PRESENTATION: cephalic FETAL ACTIVITY:          Heart rate         161          The fetus is active. AMNIOTIC FLUID: The amniotic fluid volume is  normal, SDP : 4.6 cm. PLACENTA LOCALIZATION:  anterior GRADE 0 CERVIX:TV Measures 2.1 cm w and w/o pressure ADNEXA: The ovaries are normal. GESTATIONAL AGE AND  BIOMETRICS: Gestational criteria: Estimated Date of Delivery: 08/23/19 by LMP now at [redacted]w[redacted]d Previous Scans:3          BIPARIETAL DIAMETER           4.68 cm  20 weeks HEAD CIRCUMFERENCE           17.21 cm         19+5 weeks ABDOMINAL CIRCUMFERENCE           13.55 cm         19 weeks FEMUR LENGTH           3.07 cm         19+3 weeks                                                       AVERAGE EGA(BY THIS SCAN):  19+4 weeks                                                 ESTIMATED FETAL WEIGHT:       286  grams, 55 % ANATOMICAL SURVEY                                                                            COMMENTS CEREBRAL VENTRICLES yes normal  CHOROID PLEXUS yes normal  CEREBELLUM yes normal  CISTERNA MAGNA yes normal  NUCHAL REGION yes normal  ORBITS yes normal  NASAL BONE yes normal  NOSE/LIP yes normal  FACIAL PROFILE yes normal  4 CHAMBERED HEART yes normal LVEICF 1.5 mm OUTFLOW TRACTS yes normal  DIAPHRAGM yes normal  STOMACH yes normal  RENAL REGION yes normal  BLADDER yes normal  CORD INSERTION yes normal  3 VESSEL CORD yes normal  SPINE yes normal  ARMS/HANDS yes normal  LEGS/FEET yes normal  GENITALIA yes normal      SUSPECTED ABNORMALITIES:  short cervix length 2.1 cm,LVEICF QUALITY OF SCAN: satisfactory TECHNICIAN COMMENTS: [place] TA/TV:19+1  wks,cephalic,short cervix,cx length 2.1 cm with and w/o pressure,anterior placenta gr 0,svp of fluid 4.6 cm,normal ovaries,fhr 161 bpm,LVEICF 1.5 mm,EFW 286 g 55%,anatomy complete A copy of this report including all images has been saved and backed up to a second source for retrieval if needed. All measures and details of the anatomical scan, placentation, fluid volume and pelvic anatomy are contained in that report. Amber Flora Lipps 03/30/2019 5:22 PM Clinical Impression and recommendations: I have reviewed the sonogram results above, combined with the patient's current clinical course, below are my impressions and any appropriate recommendations for management based on the sonographic findings. 1.  G1P0 Estimated Date of Delivery: 08/23/19 by  LMP, early ultrasound and confirmed by today's sonographic dating 2.  Normal fetal sonographic findings, specifically normal detailed anatomical evaluation,      no abnormalities noted 3.  Normal general sonographic findings, cervical length is stable, although still in pathologically short range Pt has been prescribe prometrium Recommend routine prenatal care based on this sonogram or as clinically indicated Lazaro Arms 04/03/2019 9:43 AM   US OB Limited  Result Date: 04/13/2019 FOLLOW UP SONOGRAM Audrey Matthews is in the office for a follow up sonogram  for cx length. She is a 19 y.o. year old G1P0 with Estimated Date of Delivery: 08/23/19 by LMP now at  551w1d weeks gestation. Thus far the pregnancy has been complicated by short cervix. GESTATION: SINGLETON PRESENTATION: cephalic FETAL ACTIVITY:          Heart rate         157          The fetus is active. AMNIOTIC FLUID: The amniotic fluid volume is  normal  svp 4.5 cm PLACENTA LOCALIZATION:  anterior GRADE 0 CERVIX: cervix appears open with no measurable cervix ADNEXA: The ovaries are normal. Previous Scans:4                                                 ANATOMICAL SURVEY                                                                             COMMENTS CEREBRAL VENTRICLES yes normal  CHOROID PLEXUS yes normal  CEREBELLUM yes normal  CISTERNA MAGNA yes normal                      4 CHAMBERED HEART yes normal LVEICF OUTFLOW TRACTS yes normal  DIAPHRAGM yes normal  STOMACH yes normal  RENAL REGION yes normal  BLADDER yes normal                      GENITALIA        SUSPECTED ABNORMALITIES:  yes QUALITY OF SCAN: satisfactory TECHNICIAN COMMENTS: US 21+1 wks,cephalic,anterior placenta gr 0,svp of fluid 4.5 cm,fhr 157 bpm,normal ovaries,LVEICF N/C,cervix appears open with no measurable cervix, dynamic cervix with bulging membranes, funneling length 1.85 cm,Dr Despina Hidden reviewed images with patient A copy of this report including all images has been saved and backed up to a second source for retrieval if needed. All measures and details of the anatomical scan, placentation, fluid volume and pelvic anatomy are contained in that report. Amber Flora LippsJ Carl 04/13/2019 2:59 PM Clinical Impression and recommendations: I have reviewed the sonogram results above, combined with the patient's current clinical course, below are my impressions and any appropriate recommendations for management based on the sonographic findings. 1.  G1P0 Estimated Date of Delivery: 08/23/19 by serial sonographic evaluations 2.  Fetal sonographic surveillance findings: Cervix dilated 1cm internally now with no measurable length with fluid and membranes to external os, no turbidity in the fluid in the cervix and no fetal parts presetn 3.  Normal general sonographic findings Will discuss timing of rescue cerclage with Dr Judeth CornfieldShankar. Amaryllis DykeLuther H  04/13/2019 3:00 PM   US OB Transvaginal  Result Date: 04/13/2019 FOLLOW UP SONOGRAM Audrey Matthews is in the office for a follow up sonogram for cx length. She is a 19 y.o. year old G1P0 with Estimated Date of Delivery: 08/23/19 by LMP now at  3751w1d weeks gestation. Thus far the pregnancy has been complicated by short cervix. GESTATION: SINGLETON  PRESENTATION: cephalic FETAL ACTIVITY:          Heart rate  157          The fetus is active. AMNIOTIC FLUID: The amniotic fluid volume is  normal  svp 4.5 cm PLACENTA LOCALIZATION:  anterior GRADE 0 CERVIX: cervix appears open with no measurable cervix ADNEXA: The ovaries are normal. Previous Scans:4                                                 ANATOMICAL SURVEY                                                                            COMMENTS CEREBRAL VENTRICLES yes normal  CHOROID PLEXUS yes normal  CEREBELLUM yes normal  CISTERNA MAGNA yes normal                      4 CHAMBERED HEART yes normal LVEICF OUTFLOW TRACTS yes normal  DIAPHRAGM yes normal  STOMACH yes normal  RENAL REGION yes normal  BLADDER yes normal                      GENITALIA        SUSPECTED ABNORMALITIES:  yes QUALITY OF SCAN: satisfactory TECHNICIAN COMMENTS: [place] 21+1 wks,cephalic,anterior placenta gr 0,svp of fluid 4.5 cm,fhr 157 bpm,normal ovaries,LVEICF N/C,cervix appears open with no measurable cervix, dynamic cervix with bulging membranes, funneling length 1.85 cm,Dr Despina Hidden reviewed images with patient A copy of this report including all images has been saved and backed up to a second source for retrieval if needed. All measures and details of the anatomical scan, placentation, fluid volume and pelvic anatomy are contained in that report. Amber Flora Lipps 04/13/2019 2:59 PM Clinical Impression and recommendations: I have reviewed the sonogram results above, combined with the patient's current clinical course, below are my impressions and any appropriate recommendations for management based on the sonographic findings. 1.  G1P0 Estimated Date of Delivery: 08/23/19 by serial sonographic evaluations 2.  Fetal sonographic surveillance findings: Cervix dilated 1cm internally now with no measurable length with fluid and membranes to external os, no turbidity in the fluid in the cervix and no fetal parts presetn 3.  Normal general sonographic  findings Will discuss timing of rescue cerclage with Dr Judeth Cornfield. Amaryllis Dyke  04/13/2019 3:00 PM   US OB Transvaginal  Result Date: 04/03/2019 SECOND TRIMESTER ANATOMY SONOGRAM Audrey Matthews is in the office for second trimester anatomy sonogram. She is a 19 y.o. year old G1P0 with Estimated Date of Delivery: 08/23/19 by LMP now at  [redacted]w[redacted]d weeks gestation. Thus far the pregnancy has been complicated by short cx. GESTATION: SINGLETON PRESENTATION: cephalic FETAL ACTIVITY:          Heart rate         161          The fetus is active. AMNIOTIC FLUID: The amniotic fluid volume is  normal, SDP : 4.6 cm. PLACENTA LOCALIZATION:  anterior GRADE 0 CERVIX:TV Measures 2.1 cm w and w/o pressure ADNEXA: The ovaries are normal. GESTATIONAL AGE AND  BIOMETRICS: Gestational criteria: Estimated Date of Delivery:  08/23/19 by LMP now at [redacted]w[redacted]d Previous Scans:3          BIPARIETAL DIAMETER           4.68 cm         20 weeks HEAD CIRCUMFERENCE           17.21 cm         19+5 weeks ABDOMINAL CIRCUMFERENCE           13.55 cm         19 weeks FEMUR LENGTH           3.07 cm         19+3 weeks                                                       AVERAGE EGA(BY THIS SCAN):  19+4 weeks                                                 ESTIMATED FETAL WEIGHT:       286  grams, 55 % ANATOMICAL SURVEY                                                                            COMMENTS CEREBRAL VENTRICLES yes normal  CHOROID PLEXUS yes normal  CEREBELLUM yes normal  CISTERNA MAGNA yes normal  NUCHAL REGION yes normal  ORBITS yes normal  NASAL BONE yes normal  NOSE/LIP yes normal  FACIAL PROFILE yes normal  4 CHAMBERED HEART yes normal LVEICF 1.5 mm OUTFLOW TRACTS yes normal  DIAPHRAGM yes normal  STOMACH yes normal  RENAL REGION yes normal  BLADDER yes normal  CORD INSERTION yes normal  3 VESSEL CORD yes normal  SPINE yes normal  ARMS/HANDS yes normal  LEGS/FEET yes normal  GENITALIA yes normal      SUSPECTED ABNORMALITIES:  short cervix length 2.1  cm,LVEICF QUALITY OF SCAN: satisfactory TECHNICIAN COMMENTS: [place] TA/TV:19+1 wks,cephalic,short cervix,cx length 2.1 cm with and w/o pressure,anterior placenta gr 0,svp of fluid 4.6 cm,normal ovaries,fhr 161 bpm,LVEICF 1.5 mm,EFW 286 g 55%,anatomy complete A copy of this report including all images has been saved and backed up to a second source for retrieval if needed. All measures and details of the anatomical scan, placentation, fluid volume and pelvic anatomy are contained in that report. Amber Flora Lipps 03/30/2019 5:22 PM Clinical Impression and recommendations: I have reviewed the sonogram results above, combined with the patient's current clinical course, below are my impressions and any appropriate recommendations for management based on the sonographic findings. 1.  G1P0 Estimated Date of Delivery: 08/23/19 by  LMP, early ultrasound and confirmed by today's sonographic dating 2.  Normal fetal sonographic findings, specifically normal detailed anatomical evaluation,      no abnormalities noted 3.  Normal general sonographic findings, cervical length is stable, although still in pathologically short range Pt has been prescribe prometrium Recommend routine  prenatal care based on this sonogram or as clinically indicated Lazaro Arms 04/03/2019 9:43 AM    Prenatal labs: ABO, Rh: O/Negative/-- (11/19 1112) Antibody: Negative (11/19 1112) Rubella: <0.90 (11/19 1112) RPR: Non Reactive (11/19 1112)  HBsAg: Negative (11/19 1112)  HIV: Non Reactive (11/19 1112)  GBS:n/a     Assessment/Plan: [redacted]w[redacted]d Estimated Date of Delivery: 08/23/19  Cervical incompetence, failure of prometrium therapy alone  Plan for rescue cerclage tomorrow at 1030 by Dr Judeth Cornfield Who is aware and agrees with plan  Amaryllis Dyke  04/13/2019, 3:19 PM

## 2019-04-13 NOTE — Progress Notes (Signed)
[place] 21+1 wks,cephalic,anterior placenta gr 0,svp of fluid 4.5 cm,fhr 157 bpm,normal ovaries,LVEICF N/C,cervix appears open with no measurable cervix, dynamic cervix with bulging membranes, funneling length 1.85 cm,Dr Despina Hidden reviewed images with patient

## 2019-04-14 ENCOUNTER — Encounter (HOSPITAL_COMMUNITY)
Admission: AD | Disposition: A | Discharge status: Home / Self Care | Payer: Self-pay | Source: Home / Self Care | Attending: Obstetrics & Gynecology

## 2019-04-14 ENCOUNTER — Encounter (HOSPITAL_COMMUNITY): Payer: Self-pay | Admitting: Obstetrics & Gynecology

## 2019-04-14 ENCOUNTER — Inpatient Hospital Stay (HOSPITAL_COMMUNITY): Payer: Medicaid Other | Admitting: Anesthesiology

## 2019-04-14 ENCOUNTER — Inpatient Hospital Stay (HOSPITAL_COMMUNITY)
Admission: RE | Admit: 2019-04-14 | Payer: Medicaid Other | Source: Home / Self Care | Admitting: Obstetrics and Gynecology

## 2019-04-14 DIAGNOSIS — Z3A21 21 weeks gestation of pregnancy: Secondary | ICD-10-CM

## 2019-04-14 DIAGNOSIS — O26892 Other specified pregnancy related conditions, second trimester: Secondary | ICD-10-CM

## 2019-04-14 DIAGNOSIS — N883 Incompetence of cervix uteri: Secondary | ICD-10-CM

## 2019-04-14 HISTORY — PX: CERVICAL CERCLAGE: SHX1329

## 2019-04-14 LAB — GC/CHLAMYDIA PROBE AMP (~~LOC~~) NOT AT ARMC
Chlamydia: NEGATIVE
Comment: NEGATIVE
Comment: NORMAL
Neisseria Gonorrhea: NEGATIVE

## 2019-04-14 LAB — CBC WITH DIFFERENTIAL/PLATELET
Abs Immature Granulocytes: 0.07 10*3/uL (ref 0.00–0.07)
Basophils Absolute: 0 10*3/uL (ref 0.0–0.1)
Basophils Relative: 0 %
Eosinophils Absolute: 0.2 10*3/uL (ref 0.0–0.5)
Eosinophils Relative: 2 %
HCT: 30.1 % — ABNORMAL LOW (ref 36.0–46.0)
Hemoglobin: 10 g/dL — ABNORMAL LOW (ref 12.0–15.0)
Immature Granulocytes: 1 %
Lymphocytes Relative: 24 %
Lymphs Abs: 2.3 10*3/uL (ref 0.7–4.0)
MCH: 29.3 pg (ref 26.0–34.0)
MCHC: 33.2 g/dL (ref 30.0–36.0)
MCV: 88.3 fL (ref 80.0–100.0)
Monocytes Absolute: 1.1 10*3/uL — ABNORMAL HIGH (ref 0.1–1.0)
Monocytes Relative: 12 %
Neutro Abs: 5.8 10*3/uL (ref 1.7–7.7)
Neutrophils Relative %: 61 %
Platelets: 239 10*3/uL (ref 150–400)
RBC: 3.41 MIL/uL — ABNORMAL LOW (ref 3.87–5.11)
RDW: 12.9 % (ref 11.5–15.5)
WBC: 9.5 10*3/uL (ref 4.0–10.5)
nRBC: 0 % (ref 0.0–0.2)

## 2019-04-14 SURGERY — CERCLAGE, CERVIX, VAGINAL APPROACH
Anesthesia: Spinal

## 2019-04-14 MED ORDER — LACTATED RINGERS IV SOLN: INTRAVENOUS | Status: DC | PRN

## 2019-04-14 MED ORDER — ONDANSETRON HCL 4 MG/2ML IJ SOLN: 4.0000 mg | Freq: Once | INTRAMUSCULAR | Status: DC | PRN

## 2019-04-14 MED ORDER — ACETAMINOPHEN 10 MG/ML IV SOLN
INTRAVENOUS | Status: AC
  Filled 2019-04-14: qty 100

## 2019-04-14 MED ORDER — ONDANSETRON HCL 4 MG/2ML IJ SOLN
INTRAMUSCULAR | Status: DC | PRN
  Administered 2019-04-14: 4 mg via INTRAVENOUS

## 2019-04-14 MED ORDER — INDOMETHACIN 50 MG PO CAPS: 50.0000 mg | ORAL_CAPSULE | Freq: Four times a day (QID) | ORAL | 0 refills | Status: AC

## 2019-04-14 MED ORDER — FENTANYL CITRATE (PF) 100 MCG/2ML IJ SOLN
25.0000 ug | INTRAMUSCULAR | Status: DC | PRN
  Administered 2019-04-14: 25 ug via INTRAVENOUS

## 2019-04-14 MED ORDER — BUPIVACAINE IN DEXTROSE 0.75-8.25 % IT SOLN
INTRATHECAL | Status: DC | PRN
  Administered 2019-04-14: 7.5 mg via INTRATHECAL

## 2019-04-14 MED ORDER — ACETAMINOPHEN 10 MG/ML IV SOLN
1000.0000 mg | Freq: Once | INTRAVENOUS | Status: DC | PRN
  Administered 2019-04-14: 1000 mg via INTRAVENOUS

## 2019-04-14 MED ORDER — FENTANYL CITRATE (PF) 100 MCG/2ML IJ SOLN
INTRAMUSCULAR | Status: AC
  Filled 2019-04-14: qty 2

## 2019-04-14 MED ORDER — INDOMETHACIN 25 MG PO CAPS
50.0000 mg | ORAL_CAPSULE | ORAL | Status: AC
  Administered 2019-04-14: 50 mg via ORAL
  Filled 2019-04-14: qty 2

## 2019-04-14 MED ORDER — ONDANSETRON HCL 4 MG/2ML IJ SOLN
INTRAMUSCULAR | Status: AC
  Filled 2019-04-14: qty 2

## 2019-04-14 SURGICAL SUPPLY — 21 items
CANISTER SUCT 3000ML PPV (MISCELLANEOUS) ×3 IMPLANT
ELECT REM PT RETURN 9FT ADLT (ELECTROSURGICAL) ×3
ELECTRODE REM PT RTRN 9FT ADLT (ELECTROSURGICAL) ×1 IMPLANT
GAUZE SPONGE 4X4 16PLY XRAY LF (GAUZE/BANDAGES/DRESSINGS) ×3 IMPLANT
GLOVE BIO SURGEON STRL SZ7.5 (GLOVE) ×3 IMPLANT
GLOVE BIOGEL PI IND STRL 8 (GLOVE) ×1 IMPLANT
GLOVE BIOGEL PI INDICATOR 8 (GLOVE) ×2
GOWN STRL REUS W/ TWL LRG LVL3 (GOWN DISPOSABLE) ×2 IMPLANT
GOWN STRL REUS W/TWL LRG LVL3 (GOWN DISPOSABLE) ×4
HIBICLENS CHG 4% 4OZ BTL (MISCELLANEOUS) ×3 IMPLANT
PACK VAGINAL MINOR WOMEN LF (CUSTOM PROCEDURE TRAY) ×3 IMPLANT
PAD OB MATERNITY 4.3X12.25 (PERSONAL CARE ITEMS) ×3 IMPLANT
PAD PREP 24X48 CUFFED NSTRL (MISCELLANEOUS) ×3 IMPLANT
PENCIL BUTTON HOLSTER BLD 10FT (ELECTRODE) ×3 IMPLANT
SUT MERSILENE FIBER S 5 CTX 12 (SUTURE) ×3 IMPLANT
SYR BULB IRRIGATION 50ML (SYRINGE) IMPLANT
TOWEL OR 17X24 6PK STRL BLUE (TOWEL DISPOSABLE) ×6 IMPLANT
TRAY FOLEY W/BAG SLVR 14FR (SET/KITS/TRAYS/PACK) ×3 IMPLANT
TUBING NON-CON 1/4 X 20 CONN (TUBING) ×2 IMPLANT
TUBING NON-CON 1/4 X 20' CONN (TUBING) ×1
YANKAUER SUCT BULB TIP NO VENT (SUCTIONS) ×3 IMPLANT

## 2019-04-14 NOTE — Discharge Instructions (Signed)
Follow up appointment at [facility] with Dr. Donalee Citrin  [address] @ 11:15  Do not use Prometrium on 1/22, 1/23, or 1/24 Resume using Prometrium on 1/25  You might see pieces of small black stitches fall out of the vagina.  Do not worry or be alarmed if this happens as this is part of an exterior stitch.    Cervical Cerclage, Care After This sheet gives you information about how to care for yourself after your procedure. Your health care provider may also give you more specific instructions. If you have problems or questions, contact your health care provider. What can I expect after the procedure? After your procedure, it is common to have:  Cramping in your abdomen.  Mucus discharge from your vagina. This may last for several days.  Painful urination (dysuria).  Spotting, or small drops of blood coming from your vagina. Follow these instructions at home:  Medicines  Take over-the-counter and prescription medicines only as told by your health care provider.  Ask your health care provider if the medicine prescribed to you requires you to avoid driving or using heavy machinery. General instructions  If you are told to go on bed rest, follow instructions from your health care provider. You may need to be on bed rest for up to 3 days.  Keep track of your vaginal discharge and watch for any changes. If you notice changes, tell your health care provider.  Avoid physical activities and exercise until your health care provider approves. Ask your health care provider what activities are safe for you.  Do not douche or have sex until your health care provider says it is okay to do so.  Keep all follow-up visits, including prenatal visits, as told by your health care provider. This is important. ? Prenatal visits are all the care that you receive before the birth of your baby. ? You may also need an ultrasound.  You may be asked to have weekly visits to have your cervix  checked. Contact a health care provider if you:  Have abnormal discharge from your vagina, such as clots.  Have a bad-smelling discharge from your vagina.  Develop a rash on your skin. This may look like redness and swelling.  Become light-headed or feel like you are going to faint.  Have abdominal pain that does not get better with medicine.  Have nausea or vomiting that does not go away. Get help right away if you:  Have vaginal bleeding that is heavier or more frequent than spotting.  Are leaking fluid or your water breaks.  Have a fever or chills.  Faint.  Have uterine contractions. These may feel like: ? A back ache. ? Lower abdominal pain. ? Mild cramps, similar to menstrual cramps. ? Tightening or pressure in your abdomen.  Think that your baby is not moving as much as usual, or you cannot feel your baby move.  Have chest pain.  Have shortness of breath. Summary  After the procedure, it is common to have cramping, vaginal discharge, painful urination, and small drops of blood coming from your vagina.  If you are told to go on bed rest, follow instructions from your health care provider. You may need to be on bed rest for up to 3 days.  Keep track of your vaginal discharge and watch for any changes. If you notice changes, tell your health care provider.  Contact a health care provider if you have abnormal vaginal discharge, become light-headed, or  have pain that cannot be controlled with medicines.  Get help right away if you have heavy vaginal bleeding, your water breaks, or you have uterine contractions. Also, get help right away if your baby is not moving as much as usual, or you have chest pain or shortness of breath. This information is not intended to replace advice given to you by your health care provider. Make sure you discuss any questions you have with your health care provider. Document Revised: 01/03/2019 Document Reviewed: 11/02/2018 Elsevier  Patient Education  [employer]  is not the cerclage stitch.

## 2019-04-14 NOTE — Anesthesia Procedure Notes (Signed)
Spinal  Patient location during procedure: OB Start time: 04/14/2019 10:38 AM End time: 04/14/2019 10:44 AM Staffing Performed: anesthesiologist  Anesthesiologist: Trevor Iha, MD Preanesthetic Checklist Completed: patient identified, IV checked, risks and benefits discussed, surgical consent, monitors and equipment checked, pre-op evaluation and timeout performed Spinal Block Patient position: sitting Prep: DuraPrep and site prepped and draped Patient monitoring: heart rate, cardiac monitor, continuous pulse ox and blood pressure Approach: midline Location: L3-4 Injection technique: single-shot Needle Needle type: Pencan  Needle gauge: 24 G Needle length: 10 cm Needle insertion depth: 6 cm Assessment Sensory level: T8 Additional Notes 2 Attempt (s). Pt tolerated procedure well.

## 2019-04-14 NOTE — Progress Notes (Signed)
FACULTY PRACTICE ANTEPARTUM COMPREHENSIVE PROGRESS NOTE  Audrey Matthews is a 19 y.o. G1P0 at 366w2d who is admitted for cervical incompetence, scheduled for rescue cerclage placement today with Dr. Judeth CornfieldShankar (MFM).   Estimated Date of Delivery: 08/23/19  Length of Stay:  1 Days. Admitted 04/13/2019  Subjective: No complaints. Patient reports good fetal movement.  She reports no uterine contractions, no bleeding and no loss of fluid per vagina.  Vitals:  Blood pressure 111/62, pulse 86, temperature 98.5 F (36.9 C), temperature source Oral, resp. rate 18, height 5\' 1"  (1.549 m), weight 71.7 kg, last menstrual period 11/16/2018, SpO2 97 %. FHR by doppler: 164 bpm Physical Examination: CONSTITUTIONAL: Well-developed, well-nourished female in no acute distress.  CARDIOVASCULAR: Normal heart rate noted, regular rhythm RESPIRATORY: Effort and breath sounds normal, no problems with respiration noted MUSCULOSKELETAL: Normal range of motion. No edema and no tenderness. 2+ distal pulses. ABDOMEN: Soft, nontender, nondistended, gravid. CERVIX:   Deferred.  Labs:  Results for orders placed or performed during the hospital encounter of 04/13/19 (from the past 48 hour(s))  SARS CORONAVIRUS 2 (TAT 6-24 HRS) Nasopharyngeal Nasopharyngeal Swab     Status: None   Collection Time: 04/13/19  5:52 PM   Specimen: Nasopharyngeal Swab  Result Value Ref Range   SARS Coronavirus 2 NEGATIVE NEGATIVE    Comment: (NOTE) SARS-CoV-2 target nucleic acids are NOT DETECTED. The SARS-CoV-2 RNA is generally detectable in upper and lower respiratory specimens during the acute phase of infection. Negative results do not preclude SARS-CoV-2 infection, do not rule out co-infections with other pathogens, and should not be used as the sole basis for treatment or other patient management decisions. Negative results must be combined with clinical observations, patient history, and epidemiological information. The  expected result is Negative. Fact Sheet for Patients: HairSlick.nohttps://www.fda.gov/media/138098/download Fact Sheet for Healthcare Providers: quierodirigir.comhttps://www.fda.gov/media/138095/download This test is not yet approved or cleared by the [place] FDA and  has been authorized for detection and/or diagnosis of SARS-CoV-2 by FDA under an Emergency Use Authorization (EUA). This EUA will remain  in effect (meaning this test can be used) for the duration of the COVID-19 declaration under Section 56 4(b)(1) of the Act, 21 U.S.C. section 360bbb-3(b)(1), unless the authorization is terminated or revoked sooner. Performed at [facility], [address] [place], [place] [id number]   CBC with Differential/Platelet     Status: Abnormal   Collection Time: 04/13/19  6:43 PM  Result Value Ref Range   WBC 12.7 (H) 4.0 - 10.5 K/uL   RBC 3.87 3.87 - 5.11 MIL/uL   Hemoglobin 11.5 (L) 12.0 - 15.0 g/dL   HCT 60.434.1 (L) 54.036.0 - 98.146.0 %   MCV 88.1 80.0 - 100.0 fL   MCH 29.7 26.0 - 34.0 pg   MCHC 33.7 30.0 - 36.0 g/dL   RDW 19.112.9 47.811.5 - 29.515.5 %   Platelets 271 150 - 400 K/uL   nRBC 0.0 0.0 - 0.2 %   Neutrophils Relative % 72 %   Neutro Abs 9.2 (H) 1.7 - 7.7 K/uL   Lymphocytes Relative 17 %   Lymphs Abs 2.1 0.7 - 4.0 K/uL   Monocytes Relative 9 %   Monocytes Absolute 1.1 (H) 0.1 - 1.0 K/uL   Eosinophils Relative 1 %   Eosinophils Absolute 0.1 0.0 - 0.5 K/uL   Basophils Relative 0 %   Basophils Absolute 0.0 0.0 - 0.1 K/uL   Immature Granulocytes 1 %   Abs Immature Granulocytes 0.09 (H) 0.00 - 0.07 K/uL  Comment: Performed at [facility], [address] [place], [place] [address]  Basic metabolic panel     Status: Abnormal   Collection Time: 04/13/19  6:43 PM  Result Value Ref Range   Sodium 133 (L) 135 - 145 mmol/L   Potassium 3.6 3.5 - 5.1 mmol/L   Chloride 102 98 - 111 mmol/L   CO2 22 22 - 32 mmol/L   Glucose, Bld 76 70 - 99 mg/dL   BUN 8 6 - 20 mg/dL   Creatinine, Ser 6.04 0.44 - 1.00  mg/dL   Calcium 9.2 8.9 - 54.0 mg/dL   GFR calc non Af Amer >60 >60 mL/min   GFR calc Af Amer >60 >60 mL/min   Anion gap 9 5 - 15    Comment: Performed at [facility], [address], [place], [place] [address]  Type and screen     Status: None   Collection Time: 04/13/19  6:43 PM  Result Value Ref Range   ABO/RH(D) O NEG    Antibody Screen NEG    Sample Expiration      04/16/2019,2359 Performed at [facility], [address], [place], [place] [address]   ABO/Rh     Status: None   Collection Time: 04/13/19  6:43 PM  Result Value Ref Range   ABO/RH(D)      O NEG Performed at [facility], [address], [place], [place] [address]   Urinalysis, Routine w reflex microscopic     Status: Abnormal   Collection Time: 04/13/19  7:59 PM  Result Value Ref Range   Color, Urine YELLOW YELLOW   APPearance HAZY (A) CLEAR   Specific Gravity, Urine 1.026 1.005 - 1.030   pH 5.0 5.0 - 8.0   Glucose, UA NEGATIVE NEGATIVE mg/dL   Hgb urine dipstick NEGATIVE NEGATIVE   Bilirubin Urine NEGATIVE NEGATIVE   Ketones, ur 20 (A) NEGATIVE mg/dL   Protein, ur NEGATIVE NEGATIVE mg/dL   Nitrite NEGATIVE NEGATIVE   Leukocytes,Ua TRACE (A) NEGATIVE   RBC / HPF 0-5 0 - 5 RBC/hpf   WBC, UA 6-10 0 - 5 WBC/hpf   Bacteria, UA RARE (A) NONE SEEN   Squamous Epithelial / LPF 0-5 0 - 5   Mucus PRESENT     Comment: Performed at [facility], [address] [place], [place] [address]  Wet prep, genital     Status: Abnormal   Collection Time: 04/13/19  8:48 PM   Specimen: Vaginal; Genital  Result Value Ref Range   Yeast Wet Prep HPF POC NONE SEEN NONE SEEN   Trich, Wet Prep NONE SEEN NONE SEEN   Clue Cells Wet Prep HPF POC NONE SEEN NONE SEEN   WBC, Wet Prep HPF POC MANY (A) NONE SEEN   Sperm NONE SEEN     Comment: Performed at [facility], [address] [place], [place] [address]  CBC with Differential/Platelet     Status: Abnormal   Collection Time: 04/14/19  6:15 AM   Result Value Ref Range   WBC 9.5 4.0 - 10.5 K/uL   RBC 3.41 (L) 3.87 - 5.11 MIL/uL   Hemoglobin 10.0 (L) 12.0 - 15.0 g/dL   HCT 62.9 (L) 52.8 - 41.3 %   MCV 88.3 80.0 - 100.0 fL   MCH 29.3 26.0 - 34.0 pg   MCHC 33.2 30.0 - 36.0 g/dL   RDW 24.4 01.0 - 27.2 %   Platelets 239  150 - 400 K/uL   nRBC 0.0 0.0 - 0.2 %   Neutrophils Relative % 61 %   Neutro Abs 5.8 1.7 - 7.7 K/uL   Lymphocytes Relative 24 %   Lymphs Abs 2.3 0.7 - 4.0 K/uL   Monocytes Relative 12 %   Monocytes Absolute 1.1 (H) 0.1 - 1.0 K/uL   Eosinophils Relative 2 %   Eosinophils Absolute 0.2 0.0 - 0.5 K/uL   Basophils Relative 0 %   Basophils Absolute 0.0 0.0 - 0.1 K/uL   Immature Granulocytes 1 %   Abs Immature Granulocytes 0.07 0.00 - 0.07 K/uL    Comment: Performed at [facility], [address], [place], [place] [address]    [place] [employer]  Result Date: 04/13/2019 FOLLOW UP SONOGRAM Vennie Doell is in the office for a follow up sonogram for cx length. She is a 19 y.o. year old G1P0 with Estimated Date of Delivery: 08/23/19 by LMP now at  [redacted]w[redacted]d weeks gestation. Thus far the pregnancy has been complicated by short cervix. GESTATION: SINGLETON PRESENTATION: cephalic FETAL ACTIVITY:          Heart rate         157          The fetus is active. AMNIOTIC FLUID: The amniotic fluid volume is  normal  svp 4.5 cm PLACENTA LOCALIZATION:  anterior GRADE 0 CERVIX: cervix appears open with no measurable cervix ADNEXA: The ovaries are normal. Previous Scans:4                                                 ANATOMICAL SURVEY                                                                            COMMENTS CEREBRAL VENTRICLES yes normal  CHOROID PLEXUS yes normal  CEREBELLUM yes normal  CISTERNA MAGNA yes normal                      4 CHAMBERED HEART yes normal LVEICF OUTFLOW TRACTS yes normal  DIAPHRAGM yes normal  STOMACH yes normal  RENAL REGION yes normal  BLADDER yes normal                      GENITALIA        SUSPECTED  ABNORMALITIES:  yes QUALITY OF SCAN: satisfactory TECHNICIAN COMMENTS: [place] 21+1 wks,cephalic,anterior placenta gr 0,svp of fluid 4.5 cm,fhr 157 bpm,normal ovaries,LVEICF N/C,cervix appears open with no measurable cervix, dynamic cervix with bulging membranes, funneling length 1.85 cm,Dr Despina Hidden reviewed images with patient A copy of this report including all images has been saved and backed up to a second source for retrieval if needed. All measures and details of the anatomical scan, placentation, fluid volume and pelvic anatomy are contained in that report. Amber Flora Lipps 04/13/2019 2:59 PM Clinical Impression and recommendations: I have reviewed the sonogram results above, combined with the patient's current clinical course, below are my impressions and any appropriate recommendations for management based on the sonographic findings.  1.  G1P0 Estimated Date of Delivery: 08/23/19 by serial sonographic evaluations 2.  Fetal sonographic surveillance findings: Cervix dilated 1cm internally now with no measurable length with fluid and membranes to external os, no turbidity in the fluid in the cervix and no fetal parts presetn 3.  Normal general sonographic findings Will discuss timing of rescue cerclage with Dr Donalee Citrin. Mertie Clause Eure 04/13/2019 3:00 PM   US OB Transvaginal  Result Date: 04/13/2019 FOLLOW UP SONOGRAM Alise Ahlgrim is in the office for a follow up sonogram for cx length. She is a 19 y.o. year old G1P0 with Estimated Date of Delivery: 08/23/19 by LMP now at  [redacted]w[redacted]d weeks gestation. Thus far the pregnancy has been complicated by short cervix. GESTATION: SINGLETON PRESENTATION: cephalic FETAL ACTIVITY:          Heart rate         157          The fetus is active. AMNIOTIC FLUID: The amniotic fluid volume is  normal  svp 4.5 cm PLACENTA LOCALIZATION:  anterior GRADE 0 CERVIX: cervix appears open with no measurable cervix ADNEXA: The ovaries are normal. Previous Scans:4                                                  ANATOMICAL SURVEY                                                                            COMMENTS CEREBRAL VENTRICLES yes normal  CHOROID PLEXUS yes normal  CEREBELLUM yes normal  CISTERNA MAGNA yes normal                      4 CHAMBERED HEART yes normal LVEICF OUTFLOW TRACTS yes normal  DIAPHRAGM yes normal  STOMACH yes normal  RENAL REGION yes normal  BLADDER yes normal                      GENITALIA        SUSPECTED ABNORMALITIES:  yes QUALITY OF SCAN: satisfactory TECHNICIAN COMMENTS: [place] 73+7 wks,cephalic,anterior placenta gr 0,svp of fluid 4.5 cm,fhr 157 bpm,normal ovaries,LVEICF N/C,cervix appears open with no measurable cervix, dynamic cervix with bulging membranes, funneling length 1.85 cm,Dr Elonda Husky reviewed images with patient A copy of this report including all images has been saved and backed up to a second source for retrieval if needed. All measures and details of the anatomical scan, placentation, fluid volume and pelvic anatomy are contained in that report. Amber Heide Guile 04/13/2019 2:59 PM Clinical Impression and recommendations: I have reviewed the sonogram results above, combined with the patient's current clinical course, below are my impressions and any appropriate recommendations for management based on the sonographic findings. 1.  G1P0 Estimated Date of Delivery: 08/23/19 by serial sonographic evaluations 2.  Fetal sonographic surveillance findings: Cervix dilated 1cm internally now with no measurable length with fluid and membranes to external os, no turbidity in the fluid in the cervix and no fetal parts presetn 3.  Normal general sonographic findings Will discuss  timing of rescue cerclage with Dr Judeth Cornfield. Lazaro Arms 04/13/2019 3:00 PM    Current scheduled medications . docusate sodium  100 mg Oral Daily  . pantoprazole  40 mg Oral Daily  . prenatal multivitamin  1 tablet Oral Q1200  . progesterone  200 mg Vaginal QHS    I have reviewed the patient's current  medications.  ASSESSMENT: Principal Problem:   Incompetency, cervical Active Problems:   Autism spectrum disorder   Borderline personality disorder (HCC)   Chronic post-traumatic stress disorder   Supervision of high-risk pregnancy   Short cervix during pregnancy in second trimester   Cervical incompetence affecting management of pregnancy in second trimester, antepartum   PLAN: Dr. Judeth Cornfield will see patient this morning to discuss procedure NPO until then Continue routine antenatal care.   Jaynie Collins, MD, FACOG Obstetrician & Gynecologist, [facility] for [employer], [facility]

## 2019-04-14 NOTE — Anesthesia Postprocedure Evaluation (Signed)
Anesthesia Post Note  Patient: Audrey Matthews, Audrey Matthews  Procedure(s) Performed: CERCLAGE CERVICAL (N/A )     Patient location during evaluation: Mother Baby Anesthesia Type: Spinal Level of consciousness: oriented and awake and alert Pain management: pain level controlled Vital Signs Assessment: post-procedure vital signs reviewed and stable Respiratory status: spontaneous breathing and respiratory function stable Cardiovascular status: blood pressure returned to baseline and stable Postop Assessment: no headache, no backache, no apparent nausea or vomiting and able to ambulate Anesthetic complications: no    Last Vitals:  Vitals:   04/14/19 1215 04/14/19 1230  BP: 113/73 115/73  Pulse: 87 76  Resp: 18 18  Temp:    SpO2: 100% 100%    Last Pain:  Vitals:   04/14/19 1230  TempSrc:   PainSc: 1    Pain Goal:    LLE Motor Response: Purposeful movement (04/14/19 1230) LLE Sensation: Tingling (04/14/19 1230) RLE Motor Response: Purposeful movement (04/14/19 1230) RLE Sensation: Tingling (04/14/19 1230)     Epidural/Spinal Function Cutaneous sensation: Tingles (04/14/19 1230), Patient able to flex knees: Yes (04/14/19 1230), Patient able to lift hips off bed: No (04/14/19 1230), Back pain beyond tenderness at insertion site: No (04/14/19 1230), Progressively worsening motor and/or sensory loss: No (04/14/19 1230), Bowel and/or bladder incontinence post epidural: No (04/14/19 1230)  Trevor Iha

## 2019-04-14 NOTE — Transfer of Care (Signed)
Immediate Anesthesia Transfer of Care Note  Patient: Audrey Matthews  Procedure(s) Performed: CERCLAGE CERVICAL (N/A )  Patient Location: PACU  Anesthesia Type:Spinal  Level of Consciousness: awake, alert  and patient cooperative  Airway & Oxygen Therapy: Patient Spontanous Breathing  Post-op Assessment: Report given to RN, Post -op Vital signs reviewed and stable and Patient moving all extremities X 4  Post vital signs: Reviewed and stable  Last Vitals:  Vitals Value Taken Time  BP 108/73 04/14/19 1155  Temp    Pulse 84 04/14/19 1157  Resp 22 04/14/19 1157  SpO2 100 % 04/14/19 1157  Vitals shown include unvalidated device data.  Last Pain:  Vitals:   04/14/19 0940  TempSrc:   PainSc: 0-No pain         Complications: No apparent anesthesia complications

## 2019-04-14 NOTE — Op Note (Signed)
Maternal-Fetal Medicine   Name: Audrey Matthews MRN: [id number] Procedure: Rescue cerclage   PRE-OPERATIVE DIAGNOSIS:  21-weeks' gestation with incompetent cervix    POST-OPERATIVE DIAGNOSIS:  Same with rescue cerclage   PROCEDURE:  Procedure(s): RESCUE CERCLAGE CERVICAL   SURGEON:  Surgeon(s) and Role:  ** Noralee Space, MD - Primary   * Jerilynn Birkenhead, MD - Assisting       ANESTHESIA:   spinal    I discussed procedure and complications in detail (her mother was present at counseling) before obtaining consent in the [facility] After informed consent, the patient was taken to the OR. After spinal anesthesia, the patient was prepped and draped in dorsal lithotomy position. A sterile weighted speculum was inserted.  The external os was about 2 cm dilated and the amniotic membrane was visible just proximal to the external os.  The cervix was about 1 cm long. Vagina was cleaned with Betadine under vision.   The cervix and vagina appeared healthy. Vagina and cervix were cleaned with betadine. External os was closed. cervix is about 1 centimeter long. Vagina was cleaned gently with betadine swab. The anterior lip of the cervix was held with a ring forceps.    With help of Bovie, a small incision of about 2 cm was made at the cervicovesical junction and the bladder was gently pushed up.   A Mersilene 5 tape stitch was placed circumferentially starting at 11 O'Clock position and around the cervix. Final stitch exited close to 1 O'Clock position. The stitch was placed as high as possible.  Before tying the first knot, 250 milliliters of saline was instilled into the bladder and a hemostat clamp was placed on the drainage tube. This was done to facilitate retraction of membranes into the uterine cavity. After tying the first knot, the bladder was emptied by releasing the clamp on the Foley catheter drainage tube. Five secure knots were placed anteriorly. The external os was closed.  Vagina was  inspected and 2 small tears were seen on left and right lateral vaginal wall (2 cm). Chromic 2/0 catgut mattress suture was placed on both to achieve complete hemostasis. Bladder was draining clear urine.  Final inspection revealed a closed external os and no bleeding was seen.   Counts correct. Estimated blood loss: 75 milliliters.   I reassured the patient of successful procedure. I recommended that she continue taking vaginal progesterone from Monday night. Patient left the operating room in stable condition. I also informed her mother about the procedure and instructions. Patient was made aware that chromic catgut may be seen extruding after a few days and this is not to be confused with the stitch placed around the cervix.  -We made an appointment [facility] at 11: 15 AM for transvaginal ultrasound to evaluate the cervix.

## 2019-04-14 NOTE — Discharge Summary (Signed)
Physician Discharge Summary  Patient ID: Audrey Matthews MRN: [id number] DOB/AGE: 2000/11/18 19 y.o.  Admit date: 04/13/2019 Discharge date:   Admission Diagnoses:  Principal Problem:   Incompetency, cervical Active Problems:   Autism spectrum disorder   Borderline personality disorder (HCC)   Chronic post-traumatic stress disorder   Supervision of high-risk pregnancy   Short cervix during pregnancy in second trimester   Cervical incompetence affecting management of pregnancy in second trimester, antepartum   [redacted] weeks gestation of pregnancy   Discharge Diagnoses:  Same  Past Medical History:  Diagnosis Date  . ADHD (attention deficit hyperactivity disorder)   . Anxiety   . Asthma   . Autism   . Depression   . PTSD (post-traumatic stress disorder)     Surgeries: Procedure(s): CERCLAGE CERVICAL on 04/14/2019   Consultants:   Discharged Condition: Improved  Hospital Course: Audrey Matthews is an 19 y.o. female G1P0 who was admitted 04/13/2019 with an incompetent cervix, and found to have a diagnosis of Incompetency, cervical.  They were brought to the operating room on 04/14/2019 and underwent the above named procedures.    She was given perioperative antibiotics:  Anti-infectives (From admission, onward)   Start     Dose/Rate Route Frequency Ordered Stop   04/13/19 1800  Ampicillin-Sulbactam (UNASYN) 3 g in sodium chloride 0.9 % 100 mL IVPB  Status:  Discontinued     3 g 200 mL/hr over 30 Minutes Intravenous Every 8 hours 04/13/19 1739 04/13/19 1937    .   She was given sequential compression devices, early ambulation, and chemoprophylaxis for DVT prophylaxis.  She benefited maximally from their hospital stay and there were no complications. She was ambulating, voiding, tolerating po and deemed stable for discharge.  Recent vital signs:  Vitals:   04/14/19 1402 04/14/19 1536  BP: 121/71 117/66  Pulse: 86 (!) 109  Resp:  17  Temp:  98.1 F (36.7 C)  SpO2:  96%    Discharge exam: Physical Examination: General appearance - alert, well appearing, and in no distress Chest - normal effort Heart - normal rate and regular rhythm Abdomen - soft, appropriately tender, dressing is clean and dry Extremities - peripheral pulses normal, no pedal edema, no clubbing or cyanosis, Homan's sign negative bilaterally Recent laboratory studies:  Results for orders placed or performed during the hospital encounter of 04/13/19  SARS CORONAVIRUS 2 (TAT 6-24 HRS) Nasopharyngeal Nasopharyngeal Swab   Specimen: Nasopharyngeal Swab  Result Value Ref Range   SARS Coronavirus 2 NEGATIVE NEGATIVE  Wet prep, genital   Specimen: Vaginal; Genital  Result Value Ref Range   Yeast Wet Prep HPF POC NONE SEEN NONE SEEN   Trich, Wet Prep NONE SEEN NONE SEEN   Clue Cells Wet Prep HPF POC NONE SEEN NONE SEEN   WBC, Wet Prep HPF POC MANY (A) NONE SEEN   Sperm NONE SEEN   CBC with Differential/Platelet  Result Value Ref Range   WBC 12.7 (H) 4.0 - 10.5 K/uL   RBC 3.87 3.87 - 5.11 MIL/uL   Hemoglobin 11.5 (L) 12.0 - 15.0 g/dL   HCT 82.9 (L) 56.2 - 13.0 %   MCV 88.1 80.0 - 100.0 fL   MCH 29.7 26.0 - 34.0 pg   MCHC 33.7 30.0 - 36.0 g/dL   RDW 86.5 78.4 - 69.6 %   Platelets 271 150 - 400 K/uL   nRBC 0.0 0.0 - 0.2 %   Neutrophils Relative % 72 %   Neutro Abs 9.2 (H) 1.7 -  7.7 K/uL   Lymphocytes Relative 17 %   Lymphs Abs 2.1 0.7 - 4.0 K/uL   Monocytes Relative 9 %   Monocytes Absolute 1.1 (H) 0.1 - 1.0 K/uL   Eosinophils Relative 1 %   Eosinophils Absolute 0.1 0.0 - 0.5 K/uL   Basophils Relative 0 %   Basophils Absolute 0.0 0.0 - 0.1 K/uL   Immature Granulocytes 1 %   Abs Immature Granulocytes 0.09 (H) 0.00 - 0.07 K/uL  Basic metabolic panel  Result Value Ref Range   Sodium 133 (L) 135 - 145 mmol/L   Potassium 3.6 3.5 - 5.1 mmol/L   Chloride 102 98 - 111 mmol/L   CO2 22 22 - 32 mmol/L   Glucose, Bld 76 70 - 99 mg/dL   BUN 8 6 - 20 mg/dL   Creatinine, Ser 2.70 0.44 -  1.00 mg/dL   Calcium 9.2 8.9 - 78.6 mg/dL   GFR calc non Af Amer >60 >60 mL/min   GFR calc Af Amer >60 >60 mL/min   Anion gap 9 5 - 15  Urinalysis, Routine w reflex microscopic  Result Value Ref Range   Color, Urine YELLOW YELLOW   APPearance HAZY (A) CLEAR   Specific Gravity, Urine 1.026 1.005 - 1.030   pH 5.0 5.0 - 8.0   Glucose, UA NEGATIVE NEGATIVE mg/dL   Hgb urine dipstick NEGATIVE NEGATIVE   Bilirubin Urine NEGATIVE NEGATIVE   Ketones, ur 20 (A) NEGATIVE mg/dL   Protein, ur NEGATIVE NEGATIVE mg/dL   Nitrite NEGATIVE NEGATIVE   Leukocytes,Ua TRACE (A) NEGATIVE   RBC / HPF 0-5 0 - 5 RBC/hpf   WBC, UA 6-10 0 - 5 WBC/hpf   Bacteria, UA RARE (A) NONE SEEN   Squamous Epithelial / LPF 0-5 0 - 5   Mucus PRESENT   CBC with Differential/Platelet  Result Value Ref Range   WBC 9.5 4.0 - 10.5 K/uL   RBC 3.41 (L) 3.87 - 5.11 MIL/uL   Hemoglobin 10.0 (L) 12.0 - 15.0 g/dL   HCT 75.4 (L) 49.2 - 01.0 %   MCV 88.3 80.0 - 100.0 fL   MCH 29.3 26.0 - 34.0 pg   MCHC 33.2 30.0 - 36.0 g/dL   RDW 07.1 21.9 - 75.8 %   Platelets 239 150 - 400 K/uL   nRBC 0.0 0.0 - 0.2 %   Neutrophils Relative % 61 %   Neutro Abs 5.8 1.7 - 7.7 K/uL   Lymphocytes Relative 24 %   Lymphs Abs 2.3 0.7 - 4.0 K/uL   Monocytes Relative 12 %   Monocytes Absolute 1.1 (H) 0.1 - 1.0 K/uL   Eosinophils Relative 2 %   Eosinophils Absolute 0.2 0.0 - 0.5 K/uL   Basophils Relative 0 %   Basophils Absolute 0.0 0.0 - 0.1 K/uL   Immature Granulocytes 1 %   Abs Immature Granulocytes 0.07 0.00 - 0.07 K/uL  Type and screen  Result Value Ref Range   ABO/RH(D) O NEG    Antibody Screen NEG    Sample Expiration      04/16/2019,2359 Performed at [facility], [address] [place], [place] [address]   ABO/Rh  Result Value Ref Range   ABO/RH(D)      O NEG Performed at [facility], [address], [place], [place] [address]   GC/Chlamydia probe amp ([place])not at [facility]  Result Value Ref Range    Chlamydia Negative    Neisseria Gonorrhea Negative    Comment  Normal Reference Ranger Chlamydia - Negative    Comment      Normal Reference Range Neisseria Gonorrhea - Negative    Discharge Medications:   Allergies as of 04/14/2019   No Known Allergies     Medication List    STOP taking these medications   acetaminophen 500 MG tablet Commonly known as: TYLENOL     TAKE these medications   albuterol 108 (90 Base) MCG/ACT inhaler Commonly known as: VENTOLIN HFA SMARTSIG:2 Puff(s) Via Inhaler Daily   Blood Pressure Monitor Misc For regular home bp monitoring during pregnancy   [place] 1 Units by Does not apply route daily as needed.   indomethacin 50 MG capsule Commonly known as: INDOCIN Take 1 capsule (50 mg total) by mouth 4 (four) times daily for 3 days.   omeprazole 20 MG capsule Commonly known as: PriLOSEC Take 1 capsule (20 mg total) by mouth daily.   prenatal vitamin w/FE, FA 27-1 MG Tabs tablet Take 1 tablet by mouth daily at 12 noon.   progesterone 200 MG capsule Commonly known as: Prometrium Place 1 capsule (200 mg total) vaginally daily.       Diagnostic Studies: US OB Comp + 14 Wk  Result Date: 04/03/2019 SECOND TRIMESTER ANATOMY SONOGRAM Audrey Matthews is in the office for second trimester anatomy sonogram. She is a 19 y.o. year old G1P0 with Estimated Date of Delivery: 08/23/19 by LMP now at  [redacted]w[redacted]d weeks gestation. Thus far the pregnancy has been complicated by short cx. GESTATION: SINGLETON PRESENTATION: cephalic FETAL ACTIVITY:          Heart rate         161          The fetus is active. AMNIOTIC FLUID: The amniotic fluid volume is  normal, SDP : 4.6 cm. PLACENTA LOCALIZATION:  anterior GRADE 0 CERVIX:TV Measures 2.1 cm w and w/o pressure ADNEXA: The ovaries are normal. GESTATIONAL AGE AND  BIOMETRICS: Gestational criteria: Estimated Date of Delivery: 08/23/19 by LMP now at [redacted]w[redacted]d Previous Scans:3          BIPARIETAL DIAMETER            4.68 cm         20 weeks HEAD CIRCUMFERENCE           17.21 cm         19+5 weeks ABDOMINAL CIRCUMFERENCE           13.55 cm         19 weeks FEMUR LENGTH           3.07 cm         19+3 weeks                                                       AVERAGE EGA(BY THIS SCAN):  19+4 weeks                                                 ESTIMATED FETAL WEIGHT:       286  grams, 55 % ANATOMICAL SURVEY  COMMENTS CEREBRAL VENTRICLES yes normal  CHOROID PLEXUS yes normal  CEREBELLUM yes normal  CISTERNA MAGNA yes normal  NUCHAL REGION yes normal  ORBITS yes normal  NASAL BONE yes normal  NOSE/LIP yes normal  FACIAL PROFILE yes normal  4 CHAMBERED HEART yes normal LVEICF 1.5 mm OUTFLOW TRACTS yes normal  DIAPHRAGM yes normal  STOMACH yes normal  RENAL REGION yes normal  BLADDER yes normal  CORD INSERTION yes normal  3 VESSEL CORD yes normal  SPINE yes normal  ARMS/HANDS yes normal  LEGS/FEET yes normal  GENITALIA yes normal      SUSPECTED ABNORMALITIES:  short cervix length 2.1 cm,LVEICF QUALITY OF SCAN: satisfactory TECHNICIAN COMMENTS: [place] TA/TV:19+1 wks,cephalic,short cervix,cx length 2.1 cm with and w/o pressure,anterior placenta gr 0,svp of fluid 4.6 cm,normal ovaries,fhr 161 bpm,LVEICF 1.5 mm,EFW 286 g 55%,anatomy complete A copy of this report including all images has been saved and backed up to a second source for retrieval if needed. All measures and details of the anatomical scan, placentation, fluid volume and pelvic anatomy are contained in that report. Audrey Matthews 03/30/2019 5:22 PM Clinical Impression and recommendations: I have reviewed the sonogram results above, combined with the patient's current clinical course, below are my impressions and any appropriate recommendations for management based on the sonographic findings. 1.  G1P0 Estimated Date of Delivery: 08/23/19 by  LMP, early ultrasound and confirmed by today's sonographic dating  2.  Normal fetal sonographic findings, specifically normal detailed anatomical evaluation,      no abnormalities noted 3.  Normal general sonographic findings, cervical length is stable, although still in pathologically short range Pt has been prescribe prometrium Recommend routine prenatal care based on this sonogram or as clinically indicated Audrey Arms 04/03/2019 9:43 AM   US OB Limited  Result Date: 04/13/2019 FOLLOW UP SONOGRAM Audrey Matthews is in the office for a follow up sonogram for cx length. She is a 19 y.o. year old G1P0 with Estimated Date of Delivery: 08/23/19 by LMP now at  [redacted]w[redacted]d weeks gestation. Thus far the pregnancy has been complicated by short cervix. GESTATION: SINGLETON PRESENTATION: cephalic FETAL ACTIVITY:          Heart rate         157          The fetus is active. AMNIOTIC FLUID: The amniotic fluid volume is  normal  svp 4.5 cm PLACENTA LOCALIZATION:  anterior GRADE 0 CERVIX: cervix appears open with no measurable cervix ADNEXA: The ovaries are normal. Previous Scans:4                                                 ANATOMICAL SURVEY                                                                            COMMENTS CEREBRAL VENTRICLES yes normal  CHOROID PLEXUS yes normal  CEREBELLUM yes normal  CISTERNA MAGNA yes normal                      4  CHAMBERED HEART yes normal LVEICF OUTFLOW TRACTS yes normal  DIAPHRAGM yes normal  STOMACH yes normal  RENAL REGION yes normal  BLADDER yes normal                      GENITALIA        SUSPECTED ABNORMALITIES:  yes QUALITY OF SCAN: satisfactory TECHNICIAN COMMENTS: [place] 21+1 wks,cephalic,anterior placenta gr 0,svp of fluid 4.5 cm,fhr 157 bpm,normal ovaries,LVEICF N/C,cervix appears open with no measurable cervix, dynamic cervix with bulging membranes, funneling length 1.85 cm,Dr Despina Hidden reviewed images with patient A copy of this report including all images has been saved and backed up to a second source for retrieval if needed. All measures and  details of the anatomical scan, placentation, fluid volume and pelvic anatomy are contained in that report. Audrey Matthews 04/13/2019 2:59 PM Clinical Impression and recommendations: I have reviewed the sonogram results above, combined with the patient's current clinical course, below are my impressions and any appropriate recommendations for management based on the sonographic findings. 1.  G1P0 Estimated Date of Delivery: 08/23/19 by serial sonographic evaluations 2.  Fetal sonographic surveillance findings: Cervix dilated 1cm internally now with no measurable length with fluid and membranes to external os, no turbidity in the fluid in the cervix and no fetal parts presetn 3.  Normal general sonographic findings Will discuss timing of rescue cerclage with Dr Judeth Cornfield. Audrey Matthews 04/13/2019 3:00 PM   US OB Transvaginal  Result Date: 04/13/2019 FOLLOW UP SONOGRAM Audrey Matthews is in the office for a follow up sonogram for cx length. She is a 19 y.o. year old G1P0 with Estimated Date of Delivery: 08/23/19 by LMP now at  [redacted]w[redacted]d weeks gestation. Thus far the pregnancy has been complicated by short cervix. GESTATION: SINGLETON PRESENTATION: cephalic FETAL ACTIVITY:          Heart rate         157          The fetus is active. AMNIOTIC FLUID: The amniotic fluid volume is  normal  svp 4.5 cm PLACENTA LOCALIZATION:  anterior GRADE 0 CERVIX: cervix appears open with no measurable cervix ADNEXA: The ovaries are normal. Previous Scans:4                                                 ANATOMICAL SURVEY                                                                            COMMENTS CEREBRAL VENTRICLES yes normal  CHOROID PLEXUS yes normal  CEREBELLUM yes normal  CISTERNA MAGNA yes normal                      4 CHAMBERED HEART yes normal LVEICF OUTFLOW TRACTS yes normal  DIAPHRAGM yes normal  STOMACH yes normal  RENAL REGION yes normal  BLADDER yes normal                      GENITALIA  SUSPECTED ABNORMALITIES:  yes  QUALITY OF SCAN: satisfactory TECHNICIAN COMMENTS: US 21+1 wks,cephalic,anterior placenta gr 0,svp of fluid 4.5 cm,fhr 157 bpm,normal ovaries,LVEICF N/C,cervix appears open with no measurable cervix, dynamic cervix with bulging membranes, funneling length 1.85 cm,Dr Despina HiddenEure reviewed images with patient A copy of this report including all images has been saved and backed up to a second source for retrieval if needed. All measures and details of the anatomical scan, placentation, fluid volume and pelvic anatomy are contained in that report. Audrey Matthews 04/13/2019 2:59 PM Clinical Impression and recommendations: I have reviewed the sonogram results above, combined with the patient's current clinical course, below are my impressions and any appropriate recommendations for management based on the sonographic findings. 1.  G1P0 Estimated Date of Delivery: 08/23/19 by serial sonographic evaluations 2.  Fetal sonographic surveillance findings: Cervix dilated 1cm internally now with no measurable length with fluid and membranes to external os, no turbidity in the fluid in the cervix and no fetal parts presetn 3.  Normal general sonographic findings Will discuss timing of rescue cerclage with Dr Judeth CornfieldShankar. Audrey Matthews 04/13/2019 3:00 PM   US OB Transvaginal  Result Date: 04/03/2019 SECOND TRIMESTER ANATOMY SONOGRAM Audrey Matthews is in the office for second trimester anatomy sonogram. She is a 19 y.o. year old G1P0 with Estimated Date of Delivery: 08/23/19 by LMP now at  614w1d weeks gestation. Thus far the pregnancy has been complicated by short cx. GESTATION: SINGLETON PRESENTATION: cephalic FETAL ACTIVITY:          Heart rate         161          The fetus is active. AMNIOTIC FLUID: The amniotic fluid volume is  normal, SDP : 4.6 cm. PLACENTA LOCALIZATION:  anterior GRADE 0 CERVIX:TV Measures 2.1 cm w and w/o pressure ADNEXA: The ovaries are normal. GESTATIONAL AGE AND  BIOMETRICS: Gestational criteria: Estimated Date of  Delivery: 08/23/19 by LMP now at 6414w1d Previous Scans:3          BIPARIETAL DIAMETER           4.68 cm         20 weeks HEAD CIRCUMFERENCE           17.21 cm         19+5 weeks ABDOMINAL CIRCUMFERENCE           13.55 cm         19 weeks FEMUR LENGTH           3.07 cm         19+3 weeks                                                       AVERAGE EGA(BY THIS SCAN):  19+4 weeks                                                 ESTIMATED FETAL WEIGHT:       286  grams, 55 % ANATOMICAL SURVEY  COMMENTS CEREBRAL VENTRICLES yes normal  CHOROID PLEXUS yes normal  CEREBELLUM yes normal  CISTERNA MAGNA yes normal  NUCHAL REGION yes normal  ORBITS yes normal  NASAL BONE yes normal  NOSE/LIP yes normal  FACIAL PROFILE yes normal  4 CHAMBERED HEART yes normal LVEICF 1.5 mm OUTFLOW TRACTS yes normal  DIAPHRAGM yes normal  STOMACH yes normal  RENAL REGION yes normal  BLADDER yes normal  CORD INSERTION yes normal  3 VESSEL CORD yes normal  SPINE yes normal  ARMS/HANDS yes normal  LEGS/FEET yes normal  GENITALIA yes normal      SUSPECTED ABNORMALITIES:  short cervix length 2.1 cm,LVEICF QUALITY OF SCAN: satisfactory TECHNICIAN COMMENTS: US TA/TV:19+1 wks,cephalic,short cervix,cx length 2.1 cm with and w/o pressure,anterior placenta gr 0,svp of fluid 4.6 cm,normal ovaries,fhr 161 bpm,LVEICF 1.5 mm,EFW 286 g 55%,anatomy complete A copy of this report including all images has been saved and backed up to a second source for retrieval if needed. All measures and details of the anatomical scan, placentation, fluid volume and pelvic anatomy are contained in that report. Audrey Matthews 03/30/2019 5:22 PM Clinical Impression and recommendations: I have reviewed the sonogram results above, combined with the patient's current clinical course, below are my impressions and any appropriate recommendations for management based on the sonographic findings. 1.  G1P0 Estimated Date  of Delivery: 08/23/19 by  LMP, early ultrasound and confirmed by today's sonographic dating 2.  Normal fetal sonographic findings, specifically normal detailed anatomical evaluation,      no abnormalities noted 3.  Normal general sonographic findings, cervical length is stable, although still in pathologically short range Pt has been prescribe prometrium Recommend routine prenatal care based on this sonogram or as clinically indicated Audrey Matthews 04/03/2019 9:43 AM    Disposition:  There are no questions and answers to display.          Follow-up Information    [facility]. Go on 04/28/2019.   Specialty: Maternal and Fetal Medicine Why: at 11:15 with Dr. Isabella BowensShankar  Contact information: [address], [address] [id number] [place] [id number] [phone]           Signed: Reva Boresanya S  04/14/2019, 5:04 PM

## 2019-04-14 NOTE — Progress Notes (Signed)
Ms. Audrey, Matthews P0 at 87-weeks' gestation, was admitted yesterday for rescue cerclage.  On ultrasound performed at her doctor's office yesterday, the cervical canal was found to be dilated with no measurable portion of the cervix.  Patient does not have symptoms of uterine contractions or vaginal bleeding or leakage of amniotic fluid.  On ultrasound performed 03/11/2019, cervical shortening (2.3 cm) was seen.  Subsequently, on 03/30/2019 she had transvaginal ultrasound at her doctor's office and the cervix measured 2.1 cm.  Patient was counseled and she started taking vaginal progesterone since then.  I had reviewed the images yesterday and discussed with Dr. Despina Hidden. I recommended rescue cerclage.  After counseling by him, she opted to have rescue cerclage.  P/E: Patient is comfortably lying in bed; not in distress.  Vital stable; afebrile. Abdomen: Soft, no tenderness.  Labs: Hemoglobin 10, WBC 9.5, hematocrit 30.1, platelets 239.  SARS-CoV-2 negative.  I counseled the patient on the cervical findings with the help of diagram.  I explained cerclage procedure and possible complications including bleeding, infection, miscarriage, and injuries to bladder or bowel (rare).  I also informed her that cerclage does not guarantee carrying pregnancy to term and preterm delivery can still occur.  I explained that the procedure involves circumferential suture around the cervix and bladder may be reflected upward by a small incision on vesica cervical portion.  After counseling, the patient opted to have cerclage procedure.  Informed consent was obtained. I recommend continuing vaginal progesterone as an adjuvant treatment after cerclage.

## 2019-04-14 NOTE — Brief Op Note (Signed)
04/14/2019  12:34 PM  PATIENT:  Audrey Matthews  19 y.o. female  PRE-OPERATIVE DIAGNOSIS:  49-weeks' gestation with Cervical Incompetence  POST-OPERATIVE DIAGNOSIS:  Rescue Cerclage  PROCEDURE:  Procedure(s): CERCLAGE CERVICAL (N/A)  SURGEON:  Surgeon(s) and Role:    * Noralee Space, MD - Primary    * Fair, Hoyle Sauer, MD - Assisting  PHYSICIAN ASSISTANT:   ASSISTANTS: none   ANESTHESIA:   spinal  EBL:  75 mL   BLOOD ADMINISTERED:none  DRAINS: none   LOCAL MEDICATIONS USED:  NONE  SPECIMEN:  No Specimen  DISPOSITION OF SPECIMEN:  N/A  COUNTS:  NO Correct  TOURNIQUET:  * No tourniquets in log *  DICTATION: .Note written in EPIC  PLAN OF CARE: Admit to inpatient   PATIENT DISPOSITION:  PACU - hemodynamically stable.   Delay start of Pharmacological VTE agent (>24hrs) due to surgical blood loss or risk of bleeding: no

## 2019-04-15 LAB — CULTURE, OB URINE: Culture: <10000 — AB

## 2019-04-18 LAB — INTEGRATED 2
AFP MoM: 1.95
Alpha-Fetoprotein: 119.7 ng/mL
Crown Rump Length: 56.1 mm
DIA MoM: 1.14
DIA Value: 251.3 pg/mL
Estriol, Unconjugated: 3.59 ng/mL
Gest. Age on Collection Date: 12 weeks
Gestational Age: 21 weeks
Maternal Age at EDD: 19.9 yr
Nuchal Translucency (NT): 1.4 mm
Nuchal Translucency MoM: 1.12
Number of Fetuses: 1
PAPP-A MoM: 1.73
PAPP-A Value: 1337 ng/mL
Test Results:: NEGATIVE
Weight: 150 [lb_av]
Weight: 150 [lb_av]
hCG MoM: 2.49
hCG Value: 52.4 IU/mL
uE3 MoM: 1.28

## 2019-04-20 ENCOUNTER — Encounter: Payer: Self-pay | Admitting: Family Medicine

## 2019-04-20 ENCOUNTER — Ambulatory Visit (INDEPENDENT_AMBULATORY_CARE_PROVIDER_SITE_OTHER): Payer: Medicaid Other | Admitting: Family Medicine

## 2019-04-20 ENCOUNTER — Other Ambulatory Visit: Payer: Self-pay

## 2019-04-20 VITALS — BP 131/82 | HR 98 | Wt 159.0 lb

## 2019-04-20 DIAGNOSIS — Z3A22 22 weeks gestation of pregnancy: Secondary | ICD-10-CM

## 2019-04-20 DIAGNOSIS — Z331 Pregnant state, incidental: Secondary | ICD-10-CM

## 2019-04-20 DIAGNOSIS — O3432 Maternal care for cervical incompetence, second trimester: Secondary | ICD-10-CM

## 2019-04-20 DIAGNOSIS — O26872 Cervical shortening, second trimester: Secondary | ICD-10-CM

## 2019-04-20 DIAGNOSIS — O0992 Supervision of high risk pregnancy, unspecified, second trimester: Secondary | ICD-10-CM

## 2019-04-20 DIAGNOSIS — Z1389 Encounter for screening for other disorder: Secondary | ICD-10-CM

## 2019-04-20 LAB — POCT URINALYSIS DIPSTICK OB
Blood, UA: NEGATIVE
Glucose, UA: NEGATIVE
Ketones, UA: NEGATIVE
Leukocytes, UA: NEGATIVE
Nitrite, UA: NEGATIVE
POC,PROTEIN,UA: NEGATIVE

## 2019-04-20 NOTE — Progress Notes (Signed)
    PRENATAL VISIT NOTE  Subjective:  Audrey Matthews is a 19 y.o. G1P0 at [redacted]w[redacted]d being seen today for ongoing prenatal care.  She is currently monitored for the following issues for this high-risk pregnancy and has MDD (major depressive disorder), recurrent episode, severe (HCC); Attention deficit hyperactivity disorder (ADHD); Autism spectrum disorder; Borderline personality disorder (HCC); Chronic post-traumatic stress disorder; Supervision of high-risk pregnancy; Asthma; Short cervix during pregnancy in second trimester; Incompetency, cervical; and Cervical incompetence affecting management of pregnancy in second trimester, antepartum on their problem list.  Patient reports no complaints.  Contractions: Not present. Vag. Bleeding: None.  Movement: Present. Denies leaking of fluid.   The following portions of the patient's history were reviewed and updated as appropriate: allergies, current medications, past family history, past medical history, past social history, past surgical history and problem list. Problem list updated.  Objective:   Vitals:   04/20/19 1444  BP: 131/82  Pulse: 98  Weight: 159 lb (72.1 kg)    Fetal Status:     Movement: Present     General:  Alert, oriented and cooperative. Patient is in no acute distress.  Skin: Skin is warm and dry. No rash noted.   Cardiovascular: Normal heart rate noted  Respiratory: Normal respiratory effort, no problems with respiration noted  Abdomen: Soft, gravid, appropriate for gestational age.  Pain/Pressure: Present     Pelvic: Cervical exam deferred        Extremities: Normal range of motion.  Edema: None  Mental Status: Normal mood and affect. Normal behavior. Normal judgment and thought content.   Assessment and Plan:  Pregnancy: G1P0 at [redacted]w[redacted]d  1. Supervision of high risk pregnancy in second trimester Up to date  2. Screening for genitourinary condition - POC Urinalysis Dipstick OB  3. Pregnant state, incidental - POC  Urinalysis Dipstick OB  4. Cervical incompetence affecting management of pregnancy in second trimester, antepartum - Cerclage placed 1/22   Preterm labor symptoms and general obstetric precautions including but not limited to vaginal bleeding, contractions, leaking of fluid and fetal movement were reviewed in detail with the patient. Please refer to After Visit Summary for other counseling recommendations.   Return in about 4 weeks (around 05/18/2019) for Routine prenatal care, Telehealth/Virtual health OB Visit.  Future Appointments  Date Time Provider Department Center  04/28/2019 11:15 AM [facility] NURSE [facility]  04/28/2019 11:15 AM [facility] [place] 4 [facility]    Federico Flake, MD

## 2019-04-25 ENCOUNTER — Other Ambulatory Visit (HOSPITAL_COMMUNITY): Payer: Self-pay | Admitting: *Deleted

## 2019-04-25 DIAGNOSIS — N883 Incompetence of cervix uteri: Secondary | ICD-10-CM

## 2019-04-28 ENCOUNTER — Ambulatory Visit (HOSPITAL_COMMUNITY): Payer: Medicaid Other

## 2019-04-28 ENCOUNTER — Ambulatory Visit (HOSPITAL_COMMUNITY): Admit: 2019-04-28 | Payer: Medicaid Other

## 2019-05-04 ENCOUNTER — Ambulatory Visit (HOSPITAL_COMMUNITY): Payer: Medicaid Other | Admitting: *Deleted

## 2019-05-04 ENCOUNTER — Encounter (HOSPITAL_COMMUNITY): Payer: Self-pay

## 2019-05-04 ENCOUNTER — Ambulatory Visit (HOSPITAL_COMMUNITY)
Admission: RE | Admit: 2019-05-04 | Discharge: 2019-05-04 | Disposition: A | Discharge status: Home / Self Care | Payer: Medicaid Other | Source: Ambulatory Visit | Attending: Obstetrics and Gynecology | Admitting: Obstetrics and Gynecology

## 2019-05-04 ENCOUNTER — Other Ambulatory Visit: Payer: Self-pay

## 2019-05-04 DIAGNOSIS — O26872 Cervical shortening, second trimester: Secondary | ICD-10-CM | POA: Insufficient documentation

## 2019-05-04 DIAGNOSIS — O3432 Maternal care for cervical incompetence, second trimester: Secondary | ICD-10-CM | POA: Diagnosis not present

## 2019-05-04 DIAGNOSIS — O0992 Supervision of high risk pregnancy, unspecified, second trimester: Secondary | ICD-10-CM | POA: Insufficient documentation

## 2019-05-04 DIAGNOSIS — Z3A24 24 weeks gestation of pregnancy: Secondary | ICD-10-CM | POA: Diagnosis not present

## 2019-05-04 DIAGNOSIS — N883 Incompetence of cervix uteri: Secondary | ICD-10-CM | POA: Diagnosis present

## 2019-05-15 ENCOUNTER — Telehealth: Payer: Self-pay | Admitting: *Deleted

## 2019-05-15 NOTE — Telephone Encounter (Signed)
Patient left message that she has a dark brown discharge. Would like a call back.   LMOVM returning patient's call.

## 2019-05-15 NOTE — Telephone Encounter (Signed)
Pt states she has a dark brown discharge with an odor for the past week.  Denies abdominal pain and baby is active.  Advised patient we will change her appt to tomorrow to in person for her visit from Wednesday.  Advised if she started having any bright red bleeding, abdominal pain, etc to go to the hospital.  Pt verbalized understanding.

## 2019-05-16 ENCOUNTER — Other Ambulatory Visit: Payer: Self-pay

## 2019-05-16 ENCOUNTER — Ambulatory Visit (INDEPENDENT_AMBULATORY_CARE_PROVIDER_SITE_OTHER): Payer: Medicaid Other | Admitting: Obstetrics and Gynecology

## 2019-05-16 VITALS — BP 128/93 | HR 122 | Wt 162.0 lb

## 2019-05-16 DIAGNOSIS — Z331 Pregnant state, incidental: Secondary | ICD-10-CM

## 2019-05-16 DIAGNOSIS — Z3A25 25 weeks gestation of pregnancy: Secondary | ICD-10-CM

## 2019-05-16 DIAGNOSIS — Z3482 Encounter for supervision of other normal pregnancy, second trimester: Secondary | ICD-10-CM

## 2019-05-16 DIAGNOSIS — Z1389 Encounter for screening for other disorder: Secondary | ICD-10-CM

## 2019-05-16 LAB — POCT URINALYSIS DIPSTICK OB
Blood, UA: NEGATIVE
Glucose, UA: NEGATIVE
Ketones, UA: NEGATIVE
Leukocytes, UA: NEGATIVE
Nitrite, UA: NEGATIVE
POC,PROTEIN,UA: NEGATIVE

## 2019-05-16 NOTE — Progress Notes (Addendum)
Patient ID: Audrey Matthews, female   DOB: 2000/09/27, 19 y.o.   MRN: [id number]    [facility] PREGNANCY VISIT Patient name: Audrey Matthews MRN [id number]  Date of birth: 2001/03/02 Chief Complaint:   Routine Prenatal Visit pt thinks she is having a few contractions.  History of Present Illness:   Audrey Matthews is a 19 y.o. G1P0 female at [redacted]w[redacted]d with an Estimated Date of Delivery: 08/23/19 being seen today for ongoing management of a high-risk pregnancy complicated by cervical incompetence. She had a cerclage placed on 01/22.  Followup u/s via MFM shows good cervical length above the  Today she reports that she has felt like she is having contractions for the past four days. She is still using Prometrium. She has not received Betamethasone. She reports dark discharge but denies bloody discharge.  Contractions: Irregular. Vag. Bleeding: None.  Movement: Present. denies leaking of fluid.  Review of Systems:   Pertinent items are noted in HPI Denies abnormal vaginal discharge w/ itching/odor/irritation, headaches, visual changes, shortness of breath, chest pain, abdominal pain, severe nausea/vomiting, or problems with urination or bowel movements unless otherwise stated above. Pertinent History Reviewed:  Reviewed past medical,surgical, social, obstetrical and family history.  Reviewed problem list, medications and allergies. Physical Assessment:   Vitals:   05/16/19 1540  BP: (!) 128/93  Pulse: (!) 122  Weight: 162 lb (73.5 kg)  Body mass index is 30.61 kg/m.           Physical Examination:   General appearance: alert, well appearing, and in no distress and oriented to person, place, and time  Mental status: alert, oriented to person, place, and time, normal mood, behavior, speech, dress, motor activity, and thought processes, affect appropriate to mood  Skin: warm & dry   Extremities: Edema: None    Cardiovascular: normal heart rate noted  Respiratory: normal respiratory effort, no  distress  Abdomen: gravid, soft, non-tender  Pelvic: speculum exam: closed cervix with good length visible above the mersilene cerclage.        Fetal Status:     Movement: Present    Fetal Surveillance Testing today: none   Results for orders placed or performed in visit on 05/16/19 (from the past 24 hour(s))  POC Urinalysis Dipstick OB   Collection Time: 05/16/19  3:38 PM  Result Value Ref Range   Color, UA     Clarity, UA     Glucose, UA Negative Negative   Bilirubin, UA     Ketones, UA n    Spec Grav, UA     Blood, UA n    pH, UA     POC,PROTEIN,UA Negative Negative, Trace, Small (1+), Moderate (2+), Large (3+), 4+   Urobilinogen, UA     Nitrite, UA n    Leukocytes, UA Negative Negative   Appearance     Odor      Assessment & Plan:  1) High-risk pregnancy G1P0 at [redacted]w[redacted]d with an Estimated Date of Delivery: 08/23/19   2) Cervical incompetence affecting management of pregnancy in second trimester, antepartum - Cerclage placed 1/22  Meds: No orders of the defined types were placed in this encounter.  Labs/procedures today: none  Treatment Plan:  Continue routine obstetrical care.  Follow-up: Return in 1 day (on 05/17/2019) for NST.  By signing my name below, I, Pietro Cassis, attest that this documentation has been prepared under the direction and in the presence of Tilda Burrow, MD Electronically Signed: Pietro Cassis, Medical Scribe. 05/16/19. 4:15 PM.  I  personally performed the services described in this documentation, which was SCRIBED in my presence. The recorded information has been reviewed and considered accurate. It has been edited as necessary during review. Jonnie Kind, MD

## 2019-05-16 NOTE — Progress Notes (Signed)
Brown discharge.  

## 2019-05-17 ENCOUNTER — Encounter: Payer: Self-pay | Admitting: Obstetrics and Gynecology

## 2019-05-17 ENCOUNTER — Telehealth: Payer: Medicaid Other | Admitting: Obstetrics and Gynecology

## 2019-05-17 ENCOUNTER — Ambulatory Visit (INDEPENDENT_AMBULATORY_CARE_PROVIDER_SITE_OTHER): Payer: Medicaid Other | Admitting: Obstetrics and Gynecology

## 2019-05-17 VITALS — BP 127/80 | HR 92 | Wt 160.6 lb

## 2019-05-17 DIAGNOSIS — Z331 Pregnant state, incidental: Secondary | ICD-10-CM

## 2019-05-17 DIAGNOSIS — O0992 Supervision of high risk pregnancy, unspecified, second trimester: Secondary | ICD-10-CM

## 2019-05-17 DIAGNOSIS — Z3A26 26 weeks gestation of pregnancy: Secondary | ICD-10-CM

## 2019-05-17 DIAGNOSIS — O26872 Cervical shortening, second trimester: Secondary | ICD-10-CM

## 2019-05-17 DIAGNOSIS — Z1389 Encounter for screening for other disorder: Secondary | ICD-10-CM

## 2019-05-17 LAB — POCT URINALYSIS DIPSTICK OB
Blood, UA: NEGATIVE
Glucose, UA: NEGATIVE
Ketones, UA: NEGATIVE
Nitrite, UA: NEGATIVE
POC,PROTEIN,UA: NEGATIVE

## 2019-05-17 NOTE — Progress Notes (Signed)
Patient ID: Audrey Matthews, female   DOB: 2000-08-13, 19 y.o.   MRN: [id number]    [facility] PREGNANCY VISIT Patient name: Harper Vandervoort MRN [id number]  Date of birth: [birth year] Chief Complaint:   High Risk Gestation (NST)  History of Present Illness:   Audrey Matthews is a 19 y.o. G1P0 female at [redacted]w[redacted]d with an Estimated Date of Delivery: 08/23/19 being seen today for ongoing management of a high-risk pregnancy complicated by cervical incompetence. Today she reports no complaints. Contractions: Irregular. Vag. Bleeding: None.  Movement: Present. denies leaking of fluid.  Review of Systems:   Pertinent items are noted in HPI Denies abnormal vaginal discharge w/ itching/odor/irritation, headaches, visual changes, shortness of breath, chest pain, abdominal pain, severe nausea/vomiting, or problems with urination or bowel movements unless otherwise stated above. Pertinent History Reviewed:  Reviewed past medical,surgical, social, obstetrical and family history.  Reviewed problem list, medications and allergies. Physical Assessment:   Vitals:   05/17/19 1446  BP: 127/80  Pulse: 92  Weight: 160 lb 9.6 oz (72.8 kg)  Body mass index is 30.35 kg/m.           Physical Examination:   General appearance: alert, well appearing, and in no distress and oriented to person, place, and time  Mental status: normal mood, behavior, speech, dress, motor activity, and thought processes, affect appropriate to mood  Skin: warm & dry   Extremities: Edema: Trace    Cardiovascular: normal heart rate noted  Respiratory: normal respiratory effort, no distress  Abdomen: gravid, soft, non-tender  Pelvic: Cervical exam deferred         Fetal Status:     Movement: Present    Fetal Surveillance Testing today: NST   Chaperone: Nikki Dom    Results for orders placed or performed in visit on 05/17/19 (from the past 24 hour(s))  POC Urinalysis Dipstick OB   Collection Time: 05/17/19  2:49 PM  Result Value Ref  Range   Color, UA     Clarity, UA     Glucose, UA Negative Negative   Bilirubin, UA     Ketones, UA neg    Spec Grav, UA     Blood, UA neg    pH, UA     POC,PROTEIN,UA Negative Negative, Trace, Small (1+), Moderate (2+), Large (3+), 4+   Urobilinogen, UA     Nitrite, UA neg    Leukocytes, UA Moderate (2+) (A) Negative   Appearance     Odor      Assessment & Plan:  1) High-risk pregnancy G1P0 at [redacted]w[redacted]d with an Estimated Date of Delivery: 08/23/19   2) Cervical incompetence, stable with merselene cerclage in place  Meds: No orders of the defined types were placed in this encounter.  Labs/procedures today: None  Treatment Plan: Return in two weeks for PN2.  Reviewed: Preterm labor symptoms and general obstetric precautions including but not limited to vaginal bleeding, contractions, leaking of fluid and fetal movement were reviewed in detail with the patient.  All questions were answered.  Follow-up: Return in about 2 weeks (around 05/31/2019) for PN2 (glucose).  Orders Placed This Encounter  Procedures  . POC Urinalysis Dipstick OB   By signing my name below, I, Nikki Dom, attest that this documentation has been prepared under the direction and in the presence of Tilda Burrow, MD. Electronically Signed: [employer]. 05/17/19. 3:49 PM.  I personally performed the services described in this documentation, which was SCRIBED in my presence. The recorded information has  been reviewed and considered accurate. It has been edited as necessary during review. Jonnie Kind, MD

## 2019-05-17 NOTE — Progress Notes (Signed)
Lark-dark brown discharge with odor. Cerclage placed on 04/14/19.

## 2019-05-18 ENCOUNTER — Telehealth: Payer: Medicaid Other | Admitting: Advanced Practice Midwife

## 2019-05-30 ENCOUNTER — Encounter: Payer: Self-pay | Admitting: Obstetrics and Gynecology

## 2019-05-30 ENCOUNTER — Other Ambulatory Visit: Payer: Self-pay

## 2019-05-30 ENCOUNTER — Ambulatory Visit (INDEPENDENT_AMBULATORY_CARE_PROVIDER_SITE_OTHER): Payer: Medicaid Other | Admitting: Obstetrics and Gynecology

## 2019-05-30 ENCOUNTER — Other Ambulatory Visit: Payer: Medicaid Other

## 2019-05-30 VITALS — BP 124/73 | HR 99 | Wt 165.4 lb

## 2019-05-30 DIAGNOSIS — O0992 Supervision of high risk pregnancy, unspecified, second trimester: Secondary | ICD-10-CM

## 2019-05-30 DIAGNOSIS — Z1389 Encounter for screening for other disorder: Secondary | ICD-10-CM

## 2019-05-30 DIAGNOSIS — Z3A27 27 weeks gestation of pregnancy: Secondary | ICD-10-CM

## 2019-05-30 DIAGNOSIS — Z331 Pregnant state, incidental: Secondary | ICD-10-CM

## 2019-05-30 DIAGNOSIS — O3432 Maternal care for cervical incompetence, second trimester: Secondary | ICD-10-CM

## 2019-05-30 DIAGNOSIS — O2342 Unspecified infection of urinary tract in pregnancy, second trimester: Secondary | ICD-10-CM

## 2019-05-30 DIAGNOSIS — Z23 Encounter for immunization: Secondary | ICD-10-CM

## 2019-05-30 LAB — POCT URINALYSIS DIPSTICK OB
Blood, UA: 3
Glucose, UA: NEGATIVE
Ketones, UA: NEGATIVE
Nitrite, UA: NEGATIVE

## 2019-05-30 MED ORDER — NITROFURANTOIN MONOHYD MACRO 100 MG PO CAPS: 100.0000 mg | ORAL_CAPSULE | Freq: Two times a day (BID) | ORAL | 0 refills | Status: DC

## 2019-05-30 NOTE — Progress Notes (Signed)
Patient ID: Raylyn Carton, female   DOB: 2000-08-31, 19 y.o.   MRN: [id number]    [facility] PREGNANCY VISIT Patient name: Audrey Matthews MRN [id number]  Date of birth: 08-24-2000 Chief Complaint:   No chief complaint on file.  History of Present Illness:   Audrey Matthews is a 19 y.o. G1P0 female at [redacted]w[redacted]d with an Estimated Date of Delivery: 08/23/19 being seen today for ongoing management of a high-risk pregnancy complicated by cervical incompetence treated with emergency Mersilene cerclage placed at 21 weeks.  Today she reports vaginal discharge. She notices brownish discharge at first. She noticed green discharge this morning. She denies any  Burning or itching around vaginal entrance.  She has LLQ cramping but is non worrisome, she believes bay is active and moving on left side. She believes she has some restless legs.     .  .   . denies leaking of fluid.  She denies cramping Review of Systems:   Pertinent items are noted in HPI Denies abnormal vaginal discharge w/ itching/odor/irritation, headaches, visual changes, shortness of breath, chest pain, abdominal pain, severe nausea/vomiting, or problems with urination or bowel movements unless otherwise stated above. Pertinent History Reviewed:  Reviewed past medical,surgical, social, obstetrical and family history.  Reviewed problem list, medications and allergies. Physical Assessment:  There were no vitals filed for this visit.There is no height or weight on file to calculate BMI.           Physical Examination:   General appearance: alert, well appearing, and in no distress  Mental status: alert, oriented to person, place, and time, normal mood, behavior, speech, dress, motor activity, and thought processes, affect appropriate to mood  Skin: warm & dry   Extremities:      Cardiovascular: normal heart rate noted  Respiratory: normal respiratory effort, no distress  Abdomen: gravid, soft, non-tender  Pelvic: Cervical exam deferred          Fetal Status:          Fetal Surveillance Testing today:   Chaperone: Arnette Norris    No results found for this or any previous visit (from the past 24 hour(s)).  Assessment & Plan:  1) High-risk pregnancy G1P0 at [redacted]w[redacted]d with an Estimated Date of Delivery: 08/23/19   2) Cervical incompetence, stable will repeat ultrasound at next visit in 2 weeks to check cervical length 3) rule out UTI we will begin Macrobid and obtain urine culture  Meds: No orders of the defined types were placed in this encounter.   Labs/procedures today: PN2  Treatment Plan:  Macrobid 100 mg BID for possible UTI Growth u/s 2 weeks  Reviewed: Preterm labor symptoms and general obstetric precautions including but not limited to vaginal bleeding, contractions, leaking of fluid and fetal movement were reviewed in detail with the patient.  All questions were answered.Check bp weekly, let us know if >140/90.   Follow-up: No follow-ups on file.  No orders of the defined types were placed in this encounter.  By signing my name below, I, Arnette Norris, attest that this documentation has been prepared under the direction and in the presence of Tilda Burrow, MD. Electronically Signed: Arnette Norris Medical Scribe. 05/30/19. 10:00 AM.  I personally performed the services described in this documentation, which was SCRIBED in my presence. The recorded information has been reviewed and considered accurate. It has been edited as necessary during review. Tilda Burrow, MD

## 2019-05-31 LAB — CBC
Hematocrit: 32.9 % — ABNORMAL LOW (ref 34.0–46.6)
Hemoglobin: 10.9 g/dL — ABNORMAL LOW (ref 11.1–15.9)
MCH: 29.7 pg (ref 26.6–33.0)
MCHC: 33.1 g/dL (ref 31.5–35.7)
MCV: 90 fL (ref 79–97)
Platelets: 327 10*3/uL (ref 150–450)
RBC: 3.67 x10E6/uL — ABNORMAL LOW (ref 3.77–5.28)
RDW: 12.5 % (ref 11.7–15.4)
WBC: 11.1 10*3/uL — ABNORMAL HIGH (ref 3.4–10.8)

## 2019-05-31 LAB — GLUCOSE TOLERANCE, 2 HOURS W/ 1HR
Glucose, 1 hour: 111 mg/dL (ref 65–179)
Glucose, 2 hour: 104 mg/dL (ref 65–152)
Glucose, Fasting: 73 mg/dL (ref 65–91)

## 2019-05-31 LAB — ANTIBODY SCREEN: Antibody Screen: NEGATIVE

## 2019-05-31 LAB — HIV ANTIBODY (ROUTINE TESTING W REFLEX): HIV Screen 4th Generation wRfx: NONREACTIVE

## 2019-05-31 LAB — RPR: RPR Ser Ql: NONREACTIVE

## 2019-06-01 ENCOUNTER — Encounter (HOSPITAL_COMMUNITY): Payer: Self-pay | Admitting: Family Medicine

## 2019-06-01 ENCOUNTER — Other Ambulatory Visit: Payer: Self-pay

## 2019-06-01 ENCOUNTER — Inpatient Hospital Stay (HOSPITAL_COMMUNITY)
Admission: AD | Admit: 2019-06-01 | Discharge: 2019-06-03 | DRG: 768 | Disposition: A | Discharge status: Home / Self Care | Payer: Medicaid Other | Attending: Obstetrics and Gynecology | Admitting: Obstetrics and Gynecology

## 2019-06-01 ENCOUNTER — Inpatient Hospital Stay (HOSPITAL_BASED_OUTPATIENT_CLINIC_OR_DEPARTMENT_OTHER): Payer: Medicaid Other

## 2019-06-01 DIAGNOSIS — Z23 Encounter for immunization: Secondary | ICD-10-CM

## 2019-06-01 DIAGNOSIS — O26873 Cervical shortening, third trimester: Secondary | ICD-10-CM | POA: Diagnosis present

## 2019-06-01 DIAGNOSIS — Z3A28 28 weeks gestation of pregnancy: Secondary | ICD-10-CM

## 2019-06-01 DIAGNOSIS — Z20822 Contact with and (suspected) exposure to covid-19: Secondary | ICD-10-CM | POA: Diagnosis present

## 2019-06-01 DIAGNOSIS — Z6791 Unspecified blood type, Rh negative: Secondary | ICD-10-CM

## 2019-06-01 DIAGNOSIS — O42913 Preterm premature rupture of membranes, unspecified as to length of time between rupture and onset of labor, third trimester: Secondary | ICD-10-CM | POA: Diagnosis present

## 2019-06-01 DIAGNOSIS — Z30430 Encounter for insertion of intrauterine contraceptive device: Secondary | ICD-10-CM

## 2019-06-01 DIAGNOSIS — O3432 Maternal care for cervical incompetence, second trimester: Secondary | ICD-10-CM | POA: Diagnosis present

## 2019-06-01 DIAGNOSIS — Z30014 Encounter for initial prescription of intrauterine contraceptive device: Secondary | ICD-10-CM | POA: Diagnosis not present

## 2019-06-01 DIAGNOSIS — O42013 Preterm premature rupture of membranes, onset of labor within 24 hours of rupture, third trimester: Secondary | ICD-10-CM

## 2019-06-01 DIAGNOSIS — O3433 Maternal care for cervical incompetence, third trimester: Secondary | ICD-10-CM | POA: Diagnosis present

## 2019-06-01 DIAGNOSIS — N883 Incompetence of cervix uteri: Secondary | ICD-10-CM | POA: Diagnosis present

## 2019-06-01 DIAGNOSIS — O0992 Supervision of high risk pregnancy, unspecified, second trimester: Secondary | ICD-10-CM

## 2019-06-01 DIAGNOSIS — O26893 Other specified pregnancy related conditions, third trimester: Secondary | ICD-10-CM | POA: Diagnosis present

## 2019-06-01 DIAGNOSIS — O42919 Preterm premature rupture of membranes, unspecified as to length of time between rupture and onset of labor, unspecified trimester: Secondary | ICD-10-CM

## 2019-06-01 DIAGNOSIS — O26872 Cervical shortening, second trimester: Secondary | ICD-10-CM

## 2019-06-01 LAB — TYPE AND SCREEN
ABO/RH(D): O NEG
Antibody Screen: NEGATIVE

## 2019-06-01 LAB — KLEIHAUER-BETKE STAIN
# Vials RhIg: 1
Fetal Cells %: 0 %
Quantitation Fetal Hemoglobin: 0 mL

## 2019-06-01 LAB — CBC
HCT: 30 % — ABNORMAL LOW (ref 36.0–46.0)
HCT: 32.8 % — ABNORMAL LOW (ref 36.0–46.0)
Hemoglobin: 10.1 g/dL — ABNORMAL LOW (ref 12.0–15.0)
Hemoglobin: 10.8 g/dL — ABNORMAL LOW (ref 12.0–15.0)
MCH: 28.5 pg (ref 26.0–34.0)
MCH: 28.6 pg (ref 26.0–34.0)
MCHC: 33.7 g/dL (ref 30.0–36.0)
MCHC: 32.9 g/dL (ref 30.0–36.0)
MCV: 84.7 fL (ref 80.0–100.0)
MCV: 86.8 fL (ref 80.0–100.0)
Platelets: 326 10*3/uL (ref 150–400)
Platelets: 338 10*3/uL (ref 150–400)
RBC: 3.54 MIL/uL — ABNORMAL LOW (ref 3.87–5.11)
RBC: 3.78 MIL/uL — ABNORMAL LOW (ref 3.87–5.11)
RDW: 12.5 % (ref 11.5–15.5)
RDW: 12.6 % (ref 11.5–15.5)
WBC: 13 10*3/uL — ABNORMAL HIGH (ref 4.0–10.5)
WBC: 15.2 10*3/uL — ABNORMAL HIGH (ref 4.0–10.5)
nRBC: 0 % (ref 0.0–0.2)
nRBC: 0 % (ref 0.0–0.2)

## 2019-06-01 LAB — SARS CORONAVIRUS 2 (TAT 6-24 HRS): SARS Coronavirus 2: NEGATIVE

## 2019-06-01 LAB — POCT FERN TEST: POCT Fern Test: POSITIVE

## 2019-06-01 MED ORDER — PANTOPRAZOLE SODIUM 40 MG PO TBEC
40.0000 mg | DELAYED_RELEASE_TABLET | Freq: Every day | ORAL | Status: DC
  Administered 2019-06-02: 12:00:00 40 mg via ORAL
  Filled 2019-06-01: qty 1

## 2019-06-01 MED ORDER — PRENATAL MULTIVITAMIN CH
1.0000 | ORAL_TABLET | Freq: Every day | ORAL | Status: DC
  Administered 2019-06-01 – 2019-06-02 (×2): 1 via ORAL
  Filled 2019-06-01 (×2): qty 1

## 2019-06-01 MED ORDER — OXYTOCIN 40 UNITS IN NORMAL SALINE INFUSION - SIMPLE MED: 2.5000 [IU]/h | INTRAVENOUS | Status: DC

## 2019-06-01 MED ORDER — ONDANSETRON HCL 4 MG/2ML IJ SOLN: 4.0000 mg | Freq: Four times a day (QID) | INTRAMUSCULAR | Status: DC | PRN

## 2019-06-01 MED ORDER — ZOLPIDEM TARTRATE 5 MG PO TABS: 5.0000 mg | ORAL_TABLET | Freq: Every evening | ORAL | Status: DC | PRN

## 2019-06-01 MED ORDER — OXYCODONE-ACETAMINOPHEN 5-325 MG PO TABS: 2.0000 | ORAL_TABLET | ORAL | Status: DC | PRN

## 2019-06-01 MED ORDER — ACETAMINOPHEN 325 MG PO TABS
650.0000 mg | ORAL_TABLET | ORAL | Status: DC | PRN
  Administered 2019-06-02: 12:00:00 650 mg via ORAL
  Filled 2019-06-01: qty 2

## 2019-06-01 MED ORDER — PRENATAL MULTIVITAMIN CH: 1.0000 | ORAL_TABLET | Freq: Every day | ORAL | Status: DC

## 2019-06-01 MED ORDER — BETAMETHASONE SOD PHOS & ACET 6 (3-3) MG/ML IJ SUSP
12.0000 mg | INTRAMUSCULAR | Status: DC
  Administered 2019-06-01: 02:00:00 12 mg via INTRAMUSCULAR
  Filled 2019-06-01: qty 5

## 2019-06-01 MED ORDER — AMOXICILLIN-POT CLAVULANATE 875-125 MG PO TABS: 1.0000 | ORAL_TABLET | Freq: Two times a day (BID) | ORAL | Status: DC

## 2019-06-01 MED ORDER — DIBUCAINE (PERIANAL) 1 % EX OINT: 1.0000 "application " | TOPICAL_OINTMENT | CUTANEOUS | Status: DC | PRN

## 2019-06-01 MED ORDER — NIFEDIPINE 10 MG PO CAPS: 10.0000 mg | ORAL_CAPSULE | ORAL | Status: DC | PRN

## 2019-06-01 MED ORDER — NIFEDIPINE 10 MG PO CAPS: 20.0000 mg | ORAL_CAPSULE | ORAL | Status: DC | PRN

## 2019-06-01 MED ORDER — LIDOCAINE HCL (PF) 1 % IJ SOLN: 30.0000 mL | INTRAMUSCULAR | Status: DC | PRN

## 2019-06-01 MED ORDER — SIMETHICONE 80 MG PO CHEW: 80.0000 mg | CHEWABLE_TABLET | ORAL | Status: DC | PRN

## 2019-06-01 MED ORDER — OXYTOCIN 40 UNITS IN NORMAL SALINE INFUSION - SIMPLE MED
INTRAVENOUS | Status: AC
  Filled 2019-06-01: qty 1000

## 2019-06-01 MED ORDER — WITCH HAZEL-GLYCERIN EX PADS: 1.0000 "application " | MEDICATED_PAD | CUTANEOUS | Status: DC | PRN

## 2019-06-01 MED ORDER — OXYTOCIN BOLUS FROM INFUSION: 500.0000 mL | Freq: Once | INTRAVENOUS | Status: DC

## 2019-06-01 MED ORDER — SOD CITRATE-CITRIC ACID 500-334 MG/5ML PO SOLN: 30.0000 mL | ORAL | Status: DC | PRN

## 2019-06-01 MED ORDER — LACTATED RINGERS IV SOLN: 500.0000 mL | INTRAVENOUS | Status: DC | PRN

## 2019-06-01 MED ORDER — RHO D IMMUNE GLOBULIN 1500 UNIT/2ML IJ SOSY
300.0000 ug | PREFILLED_SYRINGE | Freq: Once | INTRAMUSCULAR | Status: AC
  Administered 2019-06-01: 300 ug via INTRAMUSCULAR
  Filled 2019-06-01: qty 2

## 2019-06-01 MED ORDER — FENTANYL CITRATE (PF) 100 MCG/2ML IJ SOLN
INTRAMUSCULAR | Status: AC
  Administered 2019-06-01: 08:00:00 100 ug via INTRAVENOUS
  Filled 2019-06-01: qty 2

## 2019-06-01 MED ORDER — IBUPROFEN 600 MG PO TABS
600.0000 mg | ORAL_TABLET | Freq: Four times a day (QID) | ORAL | Status: DC
  Administered 2019-06-01 – 2019-06-03 (×6): 600 mg via ORAL
  Filled 2019-06-01 (×8): qty 1

## 2019-06-01 MED ORDER — SODIUM CHLORIDE 0.9 % IV SOLN
2.0000 g | Freq: Four times a day (QID) | INTRAVENOUS | Status: DC
  Administered 2019-06-01 (×2): 2 g via INTRAVENOUS
  Filled 2019-06-01 (×2): qty 2000

## 2019-06-01 MED ORDER — ALBUTEROL SULFATE HFA 108 (90 BASE) MCG/ACT IN AERS: 2.0000 | INHALATION_SPRAY | Freq: Four times a day (QID) | RESPIRATORY_TRACT | Status: DC | PRN

## 2019-06-01 MED ORDER — TETANUS-DIPHTH-ACELL PERTUSSIS 5-2.5-18.5 LF-MCG/0.5 IM SUSP
0.5000 mL | Freq: Once | INTRAMUSCULAR | Status: AC
  Administered 2019-06-03: 0.5 mL via INTRAMUSCULAR
  Filled 2019-06-01: qty 0.5

## 2019-06-01 MED ORDER — ACETAMINOPHEN 325 MG PO TABS: 650.0000 mg | ORAL_TABLET | ORAL | Status: DC | PRN

## 2019-06-01 MED ORDER — DOCUSATE SODIUM 100 MG PO CAPS: 100.0000 mg | ORAL_CAPSULE | Freq: Every day | ORAL | Status: DC

## 2019-06-01 MED ORDER — LACTATED RINGERS IV SOLN: INTRAVENOUS | Status: DC

## 2019-06-01 MED ORDER — COCONUT OIL OIL
1.0000 "application " | TOPICAL_OIL | Status: DC | PRN
  Administered 2019-06-02: 1 via TOPICAL

## 2019-06-01 MED ORDER — ALBUTEROL SULFATE (2.5 MG/3ML) 0.083% IN NEBU: 2.5000 mg | INHALATION_SOLUTION | Freq: Four times a day (QID) | RESPIRATORY_TRACT | Status: DC | PRN

## 2019-06-01 MED ORDER — OXYCODONE-ACETAMINOPHEN 5-325 MG PO TABS: 1.0000 | ORAL_TABLET | ORAL | Status: DC | PRN

## 2019-06-01 MED ORDER — MAGNESIUM SULFATE 40 GM/1000ML IV SOLN
2.0000 g/h | INTRAVENOUS | Status: DC
  Filled 2019-06-01: qty 1000

## 2019-06-01 MED ORDER — LEVONORGESTREL 19.5 MCG/DAY IU IUD
INTRAUTERINE_SYSTEM | Freq: Once | INTRAUTERINE | Status: AC
  Administered 2019-06-01: 10:00:00 1 via INTRAUTERINE
  Filled 2019-06-01: qty 1

## 2019-06-01 MED ORDER — ONDANSETRON HCL 4 MG/2ML IJ SOLN: 4.0000 mg | INTRAMUSCULAR | Status: DC | PRN

## 2019-06-01 MED ORDER — SENNOSIDES-DOCUSATE SODIUM 8.6-50 MG PO TABS
2.0000 | ORAL_TABLET | ORAL | Status: DC
  Administered 2019-06-01 – 2019-06-02 (×2): 2 via ORAL
  Filled 2019-06-01 (×2): qty 2

## 2019-06-01 MED ORDER — AZITHROMYCIN 250 MG PO TABS
1000.0000 mg | ORAL_TABLET | Freq: Once | ORAL | Status: AC
  Administered 2019-06-01: 1000 mg via ORAL
  Filled 2019-06-01: qty 4

## 2019-06-01 MED ORDER — MAGNESIUM SULFATE BOLUS VIA INFUSION
4.0000 g | Freq: Once | INTRAVENOUS | Status: AC
  Administered 2019-06-01: 4 g via INTRAVENOUS
  Filled 2019-06-01: qty 1000

## 2019-06-01 MED ORDER — FENTANYL CITRATE (PF) 100 MCG/2ML IJ SOLN
100.0000 ug | INTRAMUSCULAR | Status: DC | PRN
  Administered 2019-06-01 (×2): 100 ug via INTRAVENOUS
  Filled 2019-06-01 (×2): qty 2

## 2019-06-01 MED ORDER — ONDANSETRON HCL 4 MG PO TABS: 4.0000 mg | ORAL_TABLET | ORAL | Status: DC | PRN

## 2019-06-01 MED ORDER — DIPHENHYDRAMINE HCL 25 MG PO CAPS: 25.0000 mg | ORAL_CAPSULE | Freq: Four times a day (QID) | ORAL | Status: DC | PRN

## 2019-06-01 MED ORDER — BENZOCAINE-MENTHOL 20-0.5 % EX AERO
1.0000 "application " | INHALATION_SPRAY | CUTANEOUS | Status: DC | PRN
  Administered 2019-06-01: 1 via TOPICAL
  Filled 2019-06-01: qty 56

## 2019-06-01 MED ORDER — LABETALOL HCL 5 MG/ML IV SOLN: 40.0000 mg | INTRAVENOUS | Status: DC | PRN

## 2019-06-01 MED ORDER — CALCIUM CARBONATE ANTACID 500 MG PO CHEW: 2.0000 | CHEWABLE_TABLET | ORAL | Status: DC | PRN

## 2019-06-01 NOTE — MAU Note (Signed)
Pt states that she went to the bathroom and saw some blood on her pad and in the toilet around 2330. Pt reports continuing to see blood on her pad.   Pt reports abdominal cramping.

## 2019-06-01 NOTE — Progress Notes (Signed)
Called to assess patient for long IUD strings after PP IUD insertion  -- Patient agrees to exam.  Bedside nurse Thomos Lemons present to chaperone and assist. -- Strings noted about 5-6 cm past introitus.  With sterile glove, strings followed to cervix.  No palpable IUD protrusion at this time.  Discussed with patient that IUD appears not to be expelled at this time, however we cannot predict if it will remain optimally placed.  She is agreeable to trimming the strings to the introitus as she is concerned she will inadvertently pull the IUD out while wiping.  Strings trimmed without incident. -- Patient otherwise well.  Catalina Pizza, MD 06/01/2019 7:05 PM

## 2019-06-01 NOTE — Progress Notes (Signed)
Dr Cardell Peach is notified that the IUD blue string (approx 5 inches) is protruding from vaginal area. Dr Cardell Peach will come to evaluate this evening.

## 2019-06-01 NOTE — H&P (Signed)
Audrey Matthews is a 19 y.o. female presenting for vaginal bleeding.  Saw blood tonight when voiding.  Did have intercourse tonight "but he pulled out"  Feels fluid coming out.  .Her pregnancy has been followed at [facility] and remarkable for incompetent cervix with funneling.  She had a rescue cerclage placed on 04/14/19  She has done well since then.    RN note: Pt states that she went to the bathroom and saw some blood on her pad and in the toilet around 2330. Pt reports continuing to see blood on her pad. Pt reports abdominal cramping.  OB History    Gravida  1   Para      Term      Preterm      AB      Living  0     SAB      TAB      Ectopic      Multiple      Live Births             Past Medical History:  Diagnosis Date  . ADHD (attention deficit hyperactivity disorder)   . Anxiety   . Asthma   . Autism   . Depression   . PTSD (post-traumatic stress disorder)    Past Surgical History:  Procedure Laterality Date  . CAUTERIZE INNER NOSE    . CERVICAL CERCLAGE N/A 04/14/2019   Procedure: CERCLAGE CERVICAL;  Surgeon: Noralee Space, MD;  Location: [facility] LD ORS;  Service: Gynecology;  Laterality: N/A;  . DENTAL SURGERY    . MOUTH SURGERY     Family History: family history includes ADD / ADHD in her mother; Anxiety disorder in her mother; Depression in her maternal grandmother, mother, and sister; Drug abuse in her father. Social History:  reports that she has never smoked. She has never used smokeless tobacco. She reports previous alcohol use. She reports that she does not use drugs.     Maternal Diabetes: No Genetic Screening: Normal Maternal Ultrasounds/Referrals: Normal Fetal Ultrasounds or other Referrals:  None Maternal Substance Abuse:  No Significant Maternal Medications:  Meds include: Progesterone Significant Maternal Lab Results:  None Other Comments:  PPROM  Review of Systems  Constitutional: Negative for chills and fever.  Respiratory:  Negative for shortness of breath.   Gastrointestinal: Negative for constipation, diarrhea, nausea and vomiting.  Genitourinary: Positive for pelvic pain (cramping intermittently), vaginal bleeding and vaginal discharge.  Musculoskeletal: Negative for back pain.  Neurological: Negative for dizziness.   Maternal Medical History:  Reason for admission: Rupture of membranes and vaginal bleeding.  Nausea.  Contractions: Onset was 1-2 hours ago.   Frequency: irregular.   Perceived severity is mild.    Fetal activity: Perceived fetal activity is normal.   Last perceived fetal movement was within the past hour.    Prenatal complications: Bleeding.   No PIH, placental abnormality or preterm labor (but had cervical incompetence).   Prenatal Complications - Diabetes: none.      Blood pressure 129/89, pulse (!) 109, temperature 98.4 F (36.9 C), temperature source Oral, resp. rate 12, weight 75.5 kg, last menstrual period 11/16/2018, SpO2 97 %. Maternal Exam:  Uterine Assessment: Contraction strength is mild.  Contraction frequency is irregular.   Abdomen: Patient reports no abdominal tenderness. Fundal height is 28.   Fetal presentation: vertex  Introitus: Normal vulva. Normal vagina.  Ferning test: positive.  Nitrazine test: not done. Amniotic fluid character: clear.  Pelvis: adequate for delivery.   Cervix: Cervix  evaluated by sterile speculum exam.     Fetal Exam Fetal Monitor Review: Mode: ultrasound.   Baseline rate: 145.  Variability: moderate (6-25 bpm).   Pattern: accelerations present and no decelerations.    Fetal State Assessment: Category I - tracings are normal.     Physical Exam  Constitutional: She is oriented to person, place, and time. She appears well-developed and well-nourished. No distress.  HENT:  Head: Normocephalic.  Cardiovascular: Normal rate and regular rhythm.  Respiratory: Effort normal. No respiratory distress.  GI: Soft. She exhibits no  distension. There is no abdominal tenderness. There is no rebound and no guarding.  Genitourinary:    Vulva normal.     Genitourinary Comments: Speculum exam showed + pooling of pink/clear fluid, + ferning.  Cervix visually closed with suture visible.  Looks like there is about 1-2cm cervical length visually.    Musculoskeletal:        General: Normal range of motion.  Neurological: She is alert and oriented to person, place, and time.  Skin: Skin is warm and dry.  Psychiatric: She has a normal mood and affect.    Prenatal labs: ABO, Rh: --/--/O NEG, O NEG Performed at [facility] [address] [place], [place] [address]  [id number]/21 1843) Antibody: Negative (03/09 0846) Rubella: <0.90 (11/19 1112) RPR: Non Reactive (03/09 0846)  HBsAg: Negative (11/19 1112)  HIV: Non Reactive (03/09 0846)  GBS:     Assessment/Plan: Single IUP at [redacted]w[redacted]d Preterm premature rupture of membranes Vaginal bleeding Cerclage in place  Admit to [facility] per consult Dr Kennon Rounds Routine orders Betamethasone Latency antibiotics per protocol MD to follow   Hansel Feinstein 06/01/2019, 1:20 AM

## 2019-06-01 NOTE — Lactation Note (Signed)
This note was copied from a baby's chart. Lactation Consultation Note  Patient Name: Audrey Matthews Today's Date: 06/01/2019 Reason for consult: Initial assessment;Primapara;1st time breastfeeding;Preterm <34wks;NICU baby  Baby is 28 1/7 days in NICU  LC saw in her room 102 and the DEBP had already been set up by the RN and mom had pumped x 1 with approx 3-4 ml .  LC praised mom for her pumping. Mom aware to pump 8-12 times a day for 15 -20 mins and hand express . Per mom the #24 F is comfortable.  LC reviewed storage of breast milk and provided mom with the NICU booklet.  Also with the [facility] pamphlet with phone numbers.   Maternal Data Has patient been taught Hand Expression?: Yes Does the patient have breastfeeding experience prior to this delivery?: No  Feeding    LATCH Score                   Interventions Interventions: Breast feeding basics reviewed;DEBP  Lactation Tools Discussed/Used Tools: Pump Breast pump type: Double-Electric Breast Pump Pump Review: Milk Storage   Consult Status Consult Status: Follow-up Date: 06/02/19 Follow-up type: In-patient    Matilde Sprang  06/01/2019, 3:47 PM

## 2019-06-01 NOTE — Progress Notes (Signed)
Pt transferred to room 203 in labor and delivery. Report given to Hardin Negus, RN and Marjo Bicker, Charity fundraiser.

## 2019-06-01 NOTE — Progress Notes (Signed)
Audrey Matthews is a 18 y.o. G1P0 at [redacted]w[redacted]d admitted for PROM and pre-term labor  Subjective: Breathing through contractions, has met with Neonatology  Objective: BP 129/88   Pulse 98   Temp 97.9 F (36.6 C) (Oral)   Resp 17   Ht 5' 1" (1.549 m)   Wt 75.5 kg   LMP 11/16/2018 (Exact Date)   SpO2 100%   BMI 31.45 kg/m  No intake/output data recorded.  FHT:  FHR: 130 bpm, variability: moderate,  accelerations:  Present,  decelerations:  Absent UC:   regular, every 4-5 minutes  SVE:   Dilation: 5 Effacement (%): 90 Station: Plus 1 Exam by:: Midd[place] RN  Labs: Lab Results  Component Value Date   WBC 13.0 (H) 06/01/2019   HGB 10.1 (L) 06/01/2019   HCT 30.0 (L) 06/01/2019   MCV 84.7 06/01/2019   PLT 326 06/01/2019    Assessment / Plan: 18 yo G1P0 at 28.1 EGA here for PROM and pre-term labor, history of rescue cerclage placed at 21 weeks.  Labor: Expectant management. Cerclage removed by Dr. Pratt this AM prior to shift change. Magnesium started. Has received latency antibiotics. Fetal Wellbeing:  Category I, s/p BMZ x1, Vertex, Magnesium for neuroprotection, Neonatology consulted-appreciate their help Pain Control:  per patient request I/D:  GBS unknown, s/p 2 doses AMP Anticipated MOD:  NSVD   L  DO OB Fellow, [facility] 06/01/2019, 8:50 AM  

## 2019-06-01 NOTE — Discharge Summary (Signed)
Postpartum Discharge Summary  Date of Service updated 06/03/2019     Patient Name: Audrey Matthews DOB: 01-04-2001 MRN: [id number]  Date of admission: 06/01/2019 Delivering Provider: Merilyn Baba   Date of discharge: 06/01/2019  Admitting diagnosis: Preterm premature rupture of membranes (PPROM) with unknown onset of labor [O42.919] Intrauterine pregnancy: [redacted]w[redacted]d    Secondary diagnosis:  Active Problems:   Short cervix during pregnancy in second trimester   Incompetency, cervical   Cervical incompetence affecting management of pregnancy in second trimester, antepartum   Preterm premature rupture of membranes (PPROM) with unknown onset of labor  Additional problems: none     Discharge diagnosis: Preterm Pregnancy Delivered, suspected placental abruption                                                                                               Post partum procedures: post placental IUD  Augmentation: None  Complications: None  Hospital course:  Onset of Labor With Vaginal Delivery     19y.o. yo G1P0 at 238w1das admitted in PPROM on 06/01/2019.with PPROM after sex . She had cerclage in place She progressed into pre-term labor. She received 1 dose of rescue BMZ, 2 doses of AMP, 1 dose of azithromycin, and had ~1 hour of magnesium. She progressed quickly and delivered shortly after achieving complete dilation.  Membrane Rupture Time/Date: 12:30 AM ,06/01/2019   Intrapartum Procedures: Episiotomy: None [1]                                         Lacerations:  None [1]  Patient had a delivery of a Viable infant. 06/01/2019  Information for the patient's newborn:  Saylor, Girl DeAmariz0[[id number]]Delivery Method: Vaginal, Spontaneous(Filed from Delivery Summary)     Pateint had an uncomplicated postpartum course.  She is ambulating, tolerating a regular diet, passing flatus, and urinating well. Patient is discharged home in stable condition on 06/03/2019  Delivery time:  9:44 AM    Magnesium Sulfate received: Yes x1 hour BMZ received: Yes x1 Rhophylac:Yes MMR:N/A Transfusion:No  Physical exam  Vitals:   06/01/19 0931 06/01/19 0951 06/01/19 1000 06/01/19 1001  BP: 104/74 108/65 (!) 125/59 118/68  Pulse: 97 (!) 116 (!) 105 (!) 106  Resp:      Temp:    97.6 F (36.4 C)  TempSrc:    Oral  SpO2: 97%     Weight:      Height:       General: alert, cooperative and no distress Lochia: appropriate Uterine Fundus: firm Incision: N/A DVT Evaluation: No evidence of DVT seen on physical exam. Labs: Lab Results  Component Value Date   WBC 13.0 (H) 06/01/2019   HGB 10.1 (L) 06/01/2019   HCT 30.0 (L) 06/01/2019   MCV 84.7 06/01/2019   PLT 326 06/01/2019   CMP Latest Ref Rng & Units 04/13/2019  Glucose 70 - 99 mg/dL 76  BUN 6 - 20 mg/dL 8  Creatinine 0.44 - 1.00 mg/dL 0.49  Sodium  135 - 145 mmol/L 133(L)  Potassium 3.5 - 5.1 mmol/L 3.6  Chloride 98 - 111 mmol/L 102  CO2 22 - 32 mmol/L 22  Calcium 8.9 - 10.3 mg/dL 9.2  Total Protein 6.5 - 8.1 g/dL -  Total Bilirubin 0.3 - 1.2 mg/dL -  Alkaline Phos 38 - 126 U/L -  AST 15 - 41 U/L -  ALT 0 - 44 U/L -   Edinburgh Score: No flowsheet data found.  Discharge instruction: per After Visit Summary and "Baby and Me Booklet".  After visit meds:    Diet: routine diet  Activity: Advance as tolerated. Pelvic rest for 6 weeks.   Outpatient follow up:6 weeks Follow up Appt: Future Appointments  Date Time Provider [place]  06/13/2019  3:00 PM [facility] [place] [facility] None  06/13/2019  3:50 PM Eure, Mertie Clause, MD [facility]   Follow up Visit: Please schedule this patient for Postpartum visit in: 6 weeks with the following provider: Any provider In-person for string check For C/S patients schedule nurse incision check in weeks 2 weeks: no Low risk pregnancy complicated by: cervical incompetence, PPROM, pre-term labor Delivery mode:  SVD Anticipated Birth Control:  IUD- post  placental PP Procedures needed: IUD string check  Schedule Integrated [place] visit: no  Newborn Data: Live born female  Birth Weight: 2 lb 7.2 oz (1110 g) APGAR: 7, 8   Newborn Delivery   Birth date/time: 06/01/2019 09:44:00 Delivery type: Vaginal, Spontaneous      Baby Feeding: Bottle Disposition:NICU   06/03/2019 Merilyn Baba, DO  Jonnie Kind, MD

## 2019-06-01 NOTE — Consult Note (Signed)
  Prenatal Consult       06/01/2019  9:03 AM   I was asked by Dr. Kennon Rounds to consult on this patient for possible preterm delivery.  I met with Ms. Wallington and the infant's father today in mom's room.  She is a G1 with pregnancy complicated by incompetent cervix and cerclage placement. She was admitted last night for vaginal bleeding and PROM.  She is now in active labor.  Her cerclage was just removed and delivery is expected today.   I explained that the neonatal intensive care team would be present for the delivery and outlined the likely delivery room course for this baby including routine resuscitation and NRP-guided approaches to the treatment of respiratory distress. We discussed other common problems associated with prematurity including respiratory distress syndrome/CLD, apnea, feeding issues, temperature regulation, and infection risk.  We briefly discussed IVH/PVL, ROP, and NEC and that these are complications but did not go into detail due to mom's labor discomfort.   We discussed the average length of stay but I noted that the actual LOS would depend on the severity of problems encountered and response to treatments.  We discussed the importance of good nutrition and various methods of providing nutrition (parenteral hyperalimentation, gavage feedings and/or oral feeding). We discussed the benefits of human milk. I encouraged pumping soon after birth and outlined resources that are available to support breast feeding.    Thank you for involving [place] in the care of this patient. A member of our team will be available should the family have additional questions.  Time for consultation approximately 20 minutes.   _____________________ Electronically Signed By: Towana Badger, MD, MS Neonatologist

## 2019-06-01 NOTE — Procedures (Signed)
Post-Placental IUD Insertion Procedure Note  Patient identified, informed consent signed prior to delivery, signed copy in chart, time out was performed.    Vaginal, labial and perineal areas thoroughly inspected for lacerations- none were noted. Liletta IUD inserted.  - IUD grasped with sterile ring forceps. Fundus identified through abdominal wall using non-insertion hand. IUD inserted to fundus with ring forceps. IUD carefully released at the fundus and ring forceps gently removed from vagina.    Strings trimmed to the level of the introitus and tucked into the vagina. Patient tolerated procedure well.  Lot # [id number] Expiration Date: 07/2022  Patient given post procedure instructions and IUD care card with expiration date.  Patient is asked to keep IUD strings tucked in her vagina until her postpartum follow up visit in 4-6 weeks. Patient advised to abstain from sexual intercourse and pulling on strings before her follow-up visit. Patient verbalized an understanding of the plan of care and agrees.   Marlowe Alt, DO OB Fellow, [facility] 06/01/2019 10:39 AM

## 2019-06-01 NOTE — Plan of Care (Signed)
Patient and SO oriented to room and POC. All meds explained. Bedside ultrasound began at 01:50 (at this time there was downtime for [facility]). Questions answered.

## 2019-06-02 ENCOUNTER — Encounter (HOSPITAL_COMMUNITY): Payer: Self-pay | Admitting: Family Medicine

## 2019-06-02 LAB — CBC
HCT: 30.5 % — ABNORMAL LOW (ref 36.0–46.0)
Hemoglobin: 10 g/dL — ABNORMAL LOW (ref 12.0–15.0)
MCH: 28.7 pg (ref 26.0–34.0)
MCHC: 32.8 g/dL (ref 30.0–36.0)
MCV: 87.6 fL (ref 80.0–100.0)
Platelets: 362 10*3/uL (ref 150–400)
RBC: 3.48 MIL/uL — ABNORMAL LOW (ref 3.87–5.11)
RDW: 12.8 % (ref 11.5–15.5)
WBC: 16 10*3/uL — ABNORMAL HIGH (ref 4.0–10.5)
nRBC: 0 % (ref 0.0–0.2)

## 2019-06-02 LAB — RPR: RPR Ser Ql: NONREACTIVE

## 2019-06-02 LAB — SURGICAL PATHOLOGY

## 2019-06-02 MED ORDER — PNEUMOCOCCAL VAC POLYVALENT 25 MCG/0.5ML IJ INJ
0.5000 mL | INJECTION | INTRAMUSCULAR | Status: AC
  Administered 2019-06-03: 0.5 mL via INTRAMUSCULAR
  Filled 2019-06-02: qty 0.5

## 2019-06-02 MED ORDER — MEASLES, MUMPS & RUBELLA VAC IJ SOLR
0.5000 mL | Freq: Once | INTRAMUSCULAR | Status: AC
  Administered 2019-06-03: 09:00:00 0.5 mL via SUBCUTANEOUS
  Filled 2019-06-02: qty 0.5

## 2019-06-02 MED ORDER — RHO D IMMUNE GLOBULIN 1500 UNIT/2ML IJ SOSY
300.0000 ug | PREFILLED_SYRINGE | Freq: Once | INTRAMUSCULAR | Status: AC
  Administered 2019-06-02: 300 ug via INTRAVENOUS
  Filled 2019-06-02: qty 2

## 2019-06-02 NOTE — Progress Notes (Signed)
RN entered room to round on pt, pt has left unit to visit infant in NICU.

## 2019-06-02 NOTE — Progress Notes (Signed)
Post Partum Day 1 Subjective: no complaints, up ad lib, voiding, tolerating PO and bleeding appropriate, pain controlled, expressing milk  Objective: Blood pressure (!) 101/52, pulse 74, temperature 98.3 F (36.8 C), temperature source Oral, resp. rate 17, height 5\' 1"  (1.549 m), weight 75.5 kg, last menstrual period 11/16/2018, SpO2 99 %, unknown if currently breastfeeding.  Physical Exam:  General: alert, cooperative and no distress Lochia: appropriate Uterine Fundus: firm DVT Evaluation: No evidence of DVT seen on physical exam. No cords or calf tenderness. No significant calf/ankle edema.  Recent Labs    06/01/19 1016 06/02/19 0551  HGB 10.8* 10.0*  HCT 32.8* 30.5*    Assessment/Plan: 19 y.o. G1P0101 on PPD#1 s/p preterm vaginal delivery Plan for discharge tomorrow and Contraception IUD placed at delivery; expressing breastmilk Rh negative. Rhogam workup completed and no additional units needed.  Rhogam given yesterday morning. Continue routine postpartum care.  Anticipate discharge tomorrow.     LOS: 1 day   12 06/02/2019, 10:18 AM

## 2019-06-02 NOTE — Lactation Note (Signed)
This note was copied from a baby's chart. Lactation Consultation Note  Patient Name: Audrey Matthews Today's Date: 06/02/2019 Reason for consult: Follow-up assessment;Primapara;1st time breastfeeding;Preterm <34wks;NICU baby;Nipple pain/trauma  Baby is 24 hours old  LC visited mom in her room 102 and she was getting ready to go to NICU.  Per mom was only getting drops with her pumping so she increased her pumping level with initiation phase to 7 and now she has sore nipples.  LC offered to assess and noted not break down ,and recommended turning the  Pump down to 5 and if comfortable with #24 F stay at #24 , if not increase to #27 F .  Mom showed LC how much she had pumped off and it was 3/4 full of the colostrum container ., LC praised mom for her pumping and reminded her to pump 8-12 times in 24 hours around the clock , along with hand expressing.     Maternal Data Has patient been taught Hand Expression?: Yes  Feeding    LATCH Score                   Interventions Interventions: Breast feeding basics reviewed  Lactation Tools Discussed/Used Tools: Pump Breast pump type: Double-Electric Breast Pump Pump Review: Setup, frequency, and cleaning   Consult Status Consult Status: Follow-up Date: 06/03/19 Follow-up type: In-patient    Matilde Sprang  06/02/2019, 10:00 AM

## 2019-06-03 ENCOUNTER — Ambulatory Visit: Payer: Self-pay

## 2019-06-03 LAB — RH IG WORKUP (INCLUDES ABO/RH)
ABO/RH(D): O NEG
ABO/RH(D): O NEG
Gestational Age(Wks): 28
Gestational Age(Wks): 28
Unit division: 0
Unit division: 0

## 2019-06-03 MED ORDER — IBUPROFEN 600 MG PO TABS: 600.0000 mg | ORAL_TABLET | Freq: Four times a day (QID) | ORAL | 0 refills | Status: DC

## 2019-06-03 MED ORDER — LEVONORGESTREL 19.5 MCG/DAY IU IUD: INTRAUTERINE_SYSTEM | Freq: Once | INTRAUTERINE | Status: DC

## 2019-06-03 NOTE — Lactation Note (Signed)
This note was copied from a baby's chart. Lactation Consultation Note  Patient Name: Audrey Matthews Today's Date: 06/03/2019 Reason for consult: Mother's request;Follow-up assessment;NICU baby;Primapara;1st time breastfeeding  [name] called to ask some follow up questions about pumping. She is now in day 3 post partum, and she had pumped two times thus far today. Her milk is transitioning, per mom, and she states that she last pumped 50 mls. She is very excited about that and wanted to know about making a milk donation.  I congratulated her on her progress, and I suggested that for now she focus on putting away her pumped milk for her daughter. We reviewed milk storage guidelines. I did provide her with information on milk donations via [place] and [employer] as well as informal sharing. However, I suggested that she wait at least 6 weeks to see how her milk volume increases and only donated if she is in danger of having expired milk. Our main priority is having plenty for her daughter.  [name] verbalized understanding. I recommended pumping 8 times day for maximum benefit. She states that her initial goal was to do this for two months, and now she is interested in going beyond that. I praised her for her commitment and enthusiasm.  Currently [name] is using 24 flanges. She states these fit well. I suggested that at our next visit we observe her a pump for a few minutes to check the fit. She was thinking of decreasing flange size. I recommended that she wait until her milk fully transitioned and to call lactation to do another fitting.   All questions answered at this time. I reviewed milk storage guidelines prior to exiting the room.  Interventions Interventions: Breast feeding basics reviewed  Lactation Tools Discussed/Used Pump Review: Setup, frequency, and cleaning;Milk Storage   Consult Status Consult Status: Follow-up Date: 06/05/19    [name] 06/03/2019,  11:48 AM

## 2019-06-03 NOTE — Discharge Instructions (Signed)
Postpartum Care After Vaginal Delivery This sheet gives you information about how to care for yourself from the time you deliver your baby to up to 6-12 weeks after delivery (postpartum period). Your health care provider may also give you more specific instructions. If you have problems or questions, contact your health care provider. Follow these instructions at home: Vaginal bleeding  It is normal to have vaginal bleeding (lochia) after delivery. Wear a sanitary pad for vaginal bleeding and discharge. ? During the first week after delivery, the amount and appearance of lochia is often similar to a menstrual period. ? Over the next few weeks, it will gradually decrease to a dry, yellow-brown discharge. ? For most women, lochia stops completely by 4-6 weeks after delivery. Vaginal bleeding can vary from woman to woman.  Change your sanitary pads frequently. Watch for any changes in your flow, such as: ? A sudden increase in volume. ? A change in color. ? Large blood clots.  If you pass a blood clot from your vagina, save it and call your health care provider to discuss. Do not flush blood clots down the toilet before talking with your health care provider.  Do not use tampons or douches until your health care provider says this is safe.  If you are not breastfeeding, your period should return 6-8 weeks after delivery. If you are feeding your child breast milk only (exclusive breastfeeding), your period may not return until you stop breastfeeding. Perineal care  Keep the area between the vagina and the anus (perineum) clean and dry as told by your health care provider. Use medicated pads and pain-relieving sprays and creams as directed.  If you had a cut in the perineum (episiotomy) or a tear in the vagina, check the area for signs of infection until you are healed. Check for: ? More redness, swelling, or pain. ? Fluid or blood coming from the cut or tear. ? Warmth. ? Pus or a bad  smell.  You may be given a squirt bottle to use instead of wiping to clean the perineum area after you go to the bathroom. As you start healing, you may use the squirt bottle before wiping yourself. Make sure to wipe gently.  To relieve pain caused by an episiotomy, a tear in the vagina, or swollen veins in the anus (hemorrhoids), try taking a warm sitz bath 2-3 times a day. A sitz bath is a warm water bath that is taken while you are sitting down. The water should only come up to your hips and should cover your buttocks. Breast care  Within the first few days after delivery, your breasts may feel heavy, full, and uncomfortable (breast engorgement). Milk may also leak from your breasts. Your health care provider can suggest ways to help relieve the discomfort. Breast engorgement should go away within a few days.  If you are breastfeeding: ? Wear a bra that supports your breasts and fits you well. ? Keep your nipples clean and dry. Apply creams and ointments as told by your health care provider. ? You may need to use breast pads to absorb milk that leaks from your breasts. ? You may have uterine contractions every time you breastfeed for up to several weeks after delivery. Uterine contractions help your uterus return to its normal size. ? If you have any problems with breastfeeding, work with your health care provider or lactation consultant.  If you are not breastfeeding: ? Avoid touching your breasts a lot. Doing this can make   your breasts produce more milk. ? Wear a good-fitting bra and use cold packs to help with swelling. ? Do not squeeze out (express) milk. This causes you to make more milk. Intimacy and sexuality  Ask your health care provider when you can engage in sexual activity. This may depend on: ? Your risk of infection. ? How fast you are healing. ? Your comfort and desire to engage in sexual activity.  You are able to get pregnant after delivery, even if you have not had  your period. If desired, talk with your health care provider about methods of birth control (contraception). Medicines  Take over-the-counter and prescription medicines only as told by your health care provider.  If you were prescribed an antibiotic medicine, take it as told by your health care provider. Do not stop taking the antibiotic even if you start to feel better. Activity  Gradually return to your normal activities as told by your health care provider. Ask your health care provider what activities are safe for you.  Rest as much as possible. Try to rest or take a nap while your baby is sleeping. Eating and drinking   Drink enough fluid to keep your urine pale yellow.  Eat high-fiber foods every day. These may help prevent or relieve constipation. High-fiber foods include: ? Whole grain cereals and breads. ? Brown rice. ? Beans. ? Fresh fruits and vegetables.  Do not try to lose weight quickly by cutting back on calories.  Take your prenatal vitamins until your postpartum checkup or until your health care provider tells you it is okay to stop. Lifestyle  Do not use any products that contain nicotine or tobacco, such as cigarettes and e-cigarettes. If you need help quitting, ask your health care provider.  Do not drink alcohol, especially if you are breastfeeding. General instructions  Keep all follow-up visits for you and your baby as told by your health care provider. Most women visit their health care provider for a postpartum checkup within the first 3-6 weeks after delivery. Contact a health care provider if:  You feel unable to cope with the changes that your child brings to your life, and these feelings do not go away.  You feel unusually sad or worried.  Your breasts become red, painful, or hard.  You have a fever.  You have trouble holding urine or keeping urine from leaking.  You have little or no interest in activities you used to enjoy.  You have not  breastfed at all and you have not had a menstrual period for 12 weeks after delivery.  You have stopped breastfeeding and you have not had a menstrual period for 12 weeks after you stopped breastfeeding.  You have questions about caring for yourself or your baby.  You pass a blood clot from your vagina. Get help right away if:  You have chest pain.  You have difficulty breathing.  You have sudden, severe leg pain.  You have severe pain or cramping in your lower abdomen.  You bleed from your vagina so much that you fill more than one sanitary pad in one hour. Bleeding should not be heavier than your heaviest period.  You develop a severe headache.  You faint.  You have blurred vision or spots in your vision.  You have bad-smelling vaginal discharge.  You have thoughts about hurting yourself or your baby. If you ever feel like you may hurt yourself or others, or have thoughts about taking your own life, get help  right away. You can go to the nearest emergency department or call:  Your local emergency services (911 in the [place]  A suicide crisis helpline, such as the National Suicide Prevention Lifeline at (651) 695-5497. This is open 24 hours a day. Summary  The period of time right after you deliver your newborn up to 6-12 weeks after delivery is called the postpartum period.  Gradually return to your normal activities as told by your health care provider.  Keep all follow-up visits for you and your baby as told by your health care provider. This information is not intended to replace advice given to you by your health care provider. Make sure you discuss any questions you have with your health care provider. Document Revised: 03/12/2017 Document Reviewed: 12/21/2016 Elsevier Patient Education  2020 [employer]. Levonorgestrel intrauterine device (IUD) What is this medicine? LEVONORGESTREL IUD (LEE voe nor jes trel) is a contraceptive (birth control) device. The device  is placed inside the uterus by a healthcare professional. It is used to prevent pregnancy. This device can also be used to treat heavy bleeding that occurs during your period. This medicine may be used for other purposes; ask your health care provider or pharmacist if you have questions. COMMON BRAND NAME(S): Cameron Ali What should I tell my health care provider before I take this medicine? They need to know if you have any of these conditions:  abnormal Pap smear  cancer of the breast, uterus, or cervix  diabetes  endometritis  genital or pelvic infection now or in the past  have more than one sexual partner or your partner has more than one partner  heart disease  history of an ectopic or tubal pregnancy  immune system problems  IUD in place  liver disease or tumor  problems with blood clots or take blood-thinners  seizures  use intravenous drugs  uterus of unusual shape  vaginal bleeding that has not been explained  an unusual or allergic reaction to levonorgestrel, other hormones, silicone, or polyethylene, medicines, foods, dyes, or preservatives  pregnant or trying to get pregnant  breast-feeding How should I use this medicine? This device is placed inside the uterus by a health care professional. Talk to your pediatrician regarding the use of this medicine in children. Special care may be needed. Overdosage: If you think you have taken too much of this medicine contact a poison control center or emergency room at once. NOTE: This medicine is only for you. Do not share this medicine with others. What if I miss a dose? This does not apply. Depending on the brand of device you have inserted, the device will need to be replaced every 3 to 6 years if you wish to continue using this type of birth control. What may interact with this medicine? Do not take this medicine with any of the following  medications:  amprenavir  bosentan  fosamprenavir This medicine may also interact with the following medications:  aprepitant  armodafinil  barbiturate medicines for inducing sleep or treating seizures  bexarotene  boceprevir  griseofulvin  medicines to treat seizures like carbamazepine, ethotoin, felbamate, oxcarbazepine, phenytoin, topiramate  modafinil  pioglitazone  rifabutin  rifampin  rifapentine  some medicines to treat HIV infection like atazanavir, efavirenz, indinavir, lopinavir, nelfinavir, tipranavir, ritonavir  St. 's wort  warfarin This list may not describe all possible interactions. Give your health care provider a list of all the medicines, herbs, non-prescription drugs, or dietary supplements you use. Also tell them if  you smoke, drink alcohol, or use illegal drugs. Some items may interact with your medicine. What should I watch for while using this medicine? Visit your doctor or health care professional for regular check ups. See your doctor if you or your partner has sexual contact with others, becomes HIV positive, or gets a sexual transmitted disease. This product does not protect you against HIV infection (AIDS) or other sexually transmitted diseases. You can check the placement of the IUD yourself by reaching up to the top of your vagina with clean fingers to feel the threads. Do not pull on the threads. It is a good habit to check placement after each menstrual period. Call your doctor right away if you feel more of the IUD than just the threads or if you cannot feel the threads at all. The IUD may come out by itself. You may become pregnant if the device comes out. If you notice that the IUD has come out use a backup birth control method like condoms and call your health care provider. Using tampons will not change the position of the IUD and are okay to use during your period. This IUD can be safely scanned with magnetic resonance imaging  (MRI) only under specific conditions. Before you have an MRI, tell your healthcare provider that you have an IUD in place, and which type of IUD you have in place. What side effects may I notice from receiving this medicine? Side effects that you should report to your doctor or health care professional as soon as possible:  allergic reactions like skin rash, itching or hives, swelling of the face, lips, or tongue  fever, flu-like symptoms  genital sores  high blood pressure  no menstrual period for 6 weeks during use  pain, swelling, warmth in the leg  pelvic pain or tenderness  severe or sudden headache  signs of pregnancy  stomach cramping  sudden shortness of breath  trouble with balance, talking, or walking  unusual vaginal bleeding, discharge  yellowing of the eyes or skin Side effects that usually do not require medical attention (report to your doctor or health care professional if they continue or are bothersome):  acne  breast pain  change in sex drive or performance  changes in weight  cramping, dizziness, or faintness while the device is being inserted  headache  irregular menstrual bleeding within first 3 to 6 months of use  nausea This list may not describe all possible side effects. Call your doctor for medical advice about side effects. You may report side effects to FDA at 1-800-FDA-1088. Where should I keep my medicine? This does not apply. NOTE: This sheet is a summary. It may not cover all possible information. If you have questions about this medicine, talk to your doctor, pharmacist, or health care provider.  2020 Elsevier/Gold Standard (2018-01-18 13:22:01)

## 2019-06-03 NOTE — Progress Notes (Signed)
Discharge instructions given to patient with significant other present, patient verbalizes understanding. PP care reviewed, Pre-eclampsia pamphlet given and reviewed, and Baby and me book with patient to go home.

## 2019-06-13 ENCOUNTER — Encounter: Payer: Medicaid Other | Admitting: Obstetrics & Gynecology

## 2019-06-13 ENCOUNTER — Other Ambulatory Visit: Payer: Medicaid Other

## 2019-06-14 ENCOUNTER — Other Ambulatory Visit: Payer: Self-pay | Admitting: Family Medicine

## 2019-06-14 DIAGNOSIS — F53 Postpartum depression: Secondary | ICD-10-CM

## 2019-06-14 MED ORDER — CITALOPRAM HYDROBROMIDE 20 MG PO TABS: 20.0000 mg | ORAL_TABLET | Freq: Every day | ORAL | 2 refills | Status: DC

## 2019-06-14 NOTE — Progress Notes (Signed)
Saw patient in NICU, she is having symptoms of PPD and was recommended by LCSW to start medication. Has been on Celexa previously, 20 mg daily sent in.  Marlowe Alt, DO [facility] Fellow, [facility] 06/14/2019 1:07 PM

## 2019-06-15 ENCOUNTER — Other Ambulatory Visit: Payer: Self-pay | Admitting: Advanced Practice Midwife

## 2019-06-15 DIAGNOSIS — F53 Postpartum depression: Secondary | ICD-10-CM

## 2019-06-15 MED ORDER — CITALOPRAM HYDROBROMIDE 20 MG PO TABS: 20.0000 mg | ORAL_TABLET | Freq: Every day | ORAL | 2 refills | Status: DC

## 2019-06-19 ENCOUNTER — Telehealth: Payer: Self-pay | Admitting: *Deleted

## 2019-06-19 NOTE — Telephone Encounter (Signed)
LMOVM requesting patient to return my call.  F/U with patient regarding need for referral to [facility].

## 2019-06-21 ENCOUNTER — Encounter: Payer: Self-pay | Admitting: *Deleted

## 2019-06-24 ENCOUNTER — Ambulatory Visit: Payer: Self-pay

## 2019-06-24 NOTE — Lactation Note (Signed)
This note was copied from a baby's chart. Lactation Consultation Note  Patient Name: Girl Emberlyn Bejarano Today's Date: 06/24/2019 Reason for consult: Follow-up assessment;Mother's request;Primapara;1st time breastfeeding;NICU baby  Ms. Grega paged for lactation follow up. She was last seen by lactation on 3/13. Since that time, her milk production has decreased from 20 ounces a day to 8 ounces a day. She is pumping every two hours a day, and not pumping at night.  I watched her pump using her Medela P.I.S. pump. She states that it's a year old. She has two used personal pumps at home. However, she does have [facility]. She was not aware that they offered DEBPs.  I discussed our pump loaner option. She may page lactation on 4/4 or 4/5 for a [facility] loaner (if she brings her deposit), and I recommended that she call [facility] at the beginning of the week to set up a time to pick one up.  Also, I recommended pumping 8 times a day, including in the middle of the night and early am. We discussed this in some detail.  As I watched her pump, it seemed that she would benefit from size 27 flanges. This was recommended to her before, but she thought 24s would be better. However, now she is having pain and occasionally sees blood when pumping. Her nipples appeared in tact and WNL at this time.  I recommend lactation follow up this week, particularly to make sure she has obtained a [employer] pump.  Feeding Feeding Type: Donor Breast Milk    Interventions Interventions: Breast feeding basics reviewed;DEBP  Lactation Tools Discussed/Used Pump Review: Setup, frequency, and cleaning   Consult Status Consult Status: Follow-up Date: 07/05/19 Follow-up type: In-patient    Walker Shadow 06/24/2019, 9:23 PM

## 2019-07-06 ENCOUNTER — Other Ambulatory Visit: Payer: Self-pay

## 2019-07-06 ENCOUNTER — Ambulatory Visit (INDEPENDENT_AMBULATORY_CARE_PROVIDER_SITE_OTHER): Payer: Medicaid Other | Admitting: Women's Health

## 2019-07-06 ENCOUNTER — Encounter: Payer: Self-pay | Admitting: Women's Health

## 2019-07-06 DIAGNOSIS — Z8751 Personal history of pre-term labor: Secondary | ICD-10-CM

## 2019-07-06 DIAGNOSIS — Z30432 Encounter for removal of intrauterine contraceptive device: Secondary | ICD-10-CM

## 2019-07-06 DIAGNOSIS — Z1389 Encounter for screening for other disorder: Secondary | ICD-10-CM | POA: Diagnosis not present

## 2019-07-06 DIAGNOSIS — Z30433 Encounter for removal and reinsertion of intrauterine contraceptive device: Secondary | ICD-10-CM

## 2019-07-06 DIAGNOSIS — Z8742 Personal history of other diseases of the female genital tract: Secondary | ICD-10-CM | POA: Insufficient documentation

## 2019-07-06 DIAGNOSIS — Z30430 Encounter for insertion of intrauterine contraceptive device: Secondary | ICD-10-CM | POA: Diagnosis not present

## 2019-07-06 MED ORDER — LEVONORGESTREL 19.5 MCG/DAY IU IUD: INTRAUTERINE_SYSTEM | Freq: Once | INTRAUTERINE | Status: AC

## 2019-07-06 NOTE — Patient Instructions (Addendum)
Nothing in vagina for 3 days (no sex, douching, tampons, etc...)  Check your strings once a month to make sure you can feel them, if you are not able to please let us know  If you develop a fever of 100.4 or more in the next few weeks, or if you develop severe abdominal pain, please let [place] know  Use a backup method of birth control, such as condoms, for 2 weeks  Constipation  Drink plenty of fluid, preferably water, throughout the day  Eat foods high in fiber such as fruits, vegetables, and grains  Exercise, such as walking, is a good way to keep your bowels regular  Drink warm fluids, especially warm prune juice, or decaf coffee  Eat a 1/2 cup of real oatmeal (not instant), 1/2 cup applesauce, and 1/2-1 cup warm prune juice every day  If needed, you may take Colace (docusate sodium) stool softener once or twice a day to help keep the stool soft. If you are pregnant, wait until you are out of your first trimester (12-14 weeks of pregnancy)  If you still are having problems with constipation, you may take Miralax once daily as needed to help keep your bowels regular.  If you are pregnant, wait until you are out of your first trimester (12-14 weeks of pregnancy)   Tips To Increase Milk Supply  Lots of water! Enough so that your urine is clear  Plenty of calories, if you're not getting enough calories, your milk supply can decrease  Breastfeed/pump often, every 2-3 hours x 20-28mins  Fenugreek 3 pills 3 times a day, this may make your urine smell like maple syrup  Mother's Milk Tea  Lactation cookies, google for the recipe  Real oatmeal  Body Armor sports drinks       Intrauterine Device Insertion, Care After  This sheet gives you information about how to care for yourself after your procedure. Your health care provider may also give you more specific instructions. If you have problems or questions, contact your health care provider. What can I expect after the  procedure? After the procedure, it is common to have:  Cramps and pain in the abdomen.  Light bleeding (spotting) or heavier bleeding that is like your menstrual period. This may last for up to a few days.  Lower back pain.  Dizziness.  Headaches.  Nausea. Follow these instructions at home:  Before resuming sexual activity, check to make sure that you can feel the IUD string(s). You should be able to feel the end of the string(s) below the opening of your cervix. If your IUD string is in place, you may resume sexual activity. ? If you had a hormonal IUD inserted more than 7 days after your most recent period started, you will need to use a backup method of birth control for 7 days after IUD insertion. Ask your health care provider whether this applies to you.  Continue to check that the IUD is still in place by feeling for the string(s) after every menstrual period, or once a month.  Take over-the-counter and prescription medicines only as told by your health care provider.  Do not drive or use heavy machinery while taking prescription pain medicine.  Keep all follow-up visits as told by your health care provider. This is important. Contact a health care provider if:  You have bleeding that is heavier or lasts longer than a normal menstrual cycle.  You have a fever.  You have cramps or abdominal pain  that get worse or do not get better with medicine.  You develop abdominal pain that is new or is not in the same area of earlier cramping and pain.  You feel lightheaded or weak.  You have abnormal or bad-smelling discharge from your vagina.  You have pain during sexual activity.  You have any of the following problems with your IUD string(s): ? The string bothers or hurts you or your sexual partner. ? You cannot feel the string. ? The string has gotten longer.  You can feel the IUD in your vagina.  You think you may be pregnant, or you miss your menstrual period.  You  think you may have an STI (sexually transmitted infection). Get help right away if:  You have flu-like symptoms.  You have a fever and chills.  You can feel that your IUD has slipped out of place. Summary  After the procedure, it is common to have cramps and pain in the abdomen. It is also common to have light bleeding (spotting) or heavier bleeding that is like your menstrual period.  Continue to check that the IUD is still in place by feeling for the string(s) after every menstrual period, or once a month.  Keep all follow-up visits as told by your health care provider. This is important.  Contact your health care provider if you have problems with your IUD string(s), such as the string getting longer or bothering you or your sexual partner. This information is not intended to replace advice given to you by your health care provider. Make sure you discuss any questions you have with your health care provider. Document Revised: 02/19/2017 Document Reviewed: 01/29/2016 Elsevier Patient Education  2020 [employer].

## 2019-07-06 NOTE — Progress Notes (Signed)
POSTPARTUM VISIT Patient name: Audrey Matthews MRN [id number]  Date of birth: 06-12-2000 Chief Complaint:   postpartum visit (IUD check; thinks IUD strings need to be trimmed)  History of Present Illness:   Alina Debose is a 19 y.o. G35P0101 Caucasian female being seen today for a postpartum visit. She is 5 weeks postpartum following a spontaneous vaginal delivery at 28.1 gestational weeks. Had rescue cerclage @ 21wks for no measurable cx, PPROM @ 28.1 w/ onset rapid active labor. Anesthesia: none. Laceration: none. I have fully reviewed the prenatal and intrapartum course. Pregnancy complicated by cervical incompetence. Postpartum course has been uncomplicated. Bleeding no bleeding. Bowel function is abnormal: constipation. Bladder function is normal.  Patient is sexually active.  Contraception method is postplacental Liletta, feels like strings are very long.    Last pap <21yo.  Results were n/a .  Patient's last menstrual period was 06/29/2019.  Baby's course has been complicated by severe prematurity- NICU. Baby is feeding by in NICU, pt pumping    [place] Postpartum Depression Screening: positive, strong mental health hx, no meds, hasn't done therapy w/ [facility] 'since Covid'. Some occ thoughts of self-harm, but talks to boyfriend and they go away, no thoughts in over 2wks. Does not want to be on any meds right now. Is going to try to call [facility] to schedule therapy.  Edinburgh Postnatal Depression Scale - 07/06/19 1339      Edinburgh Postnatal Depression Scale:  In the Past 7 Days   I have been able to laugh and see the funny side of things.  1    I have looked forward with enjoyment to things.  2    I have blamed myself unnecessarily when things went wrong.  3    I have been anxious or worried for no good reason.  2    I have felt scared or panicky for no good reason.  0    Things have been getting on top of me.  2    I have been so unhappy that I have had difficulty sleeping.  3     I have felt sad or miserable.  2    I have been so unhappy that I have been crying.  2    The thought of harming myself has occurred to me.  2    Edinburgh Postnatal Depression Scale Total  19      Review of Systems:   Pertinent items are noted in HPI Denies Abnormal vaginal discharge w/ itching/odor/irritation, headaches, visual changes, shortness of breath, chest pain, abdominal pain, severe nausea/vomiting, or problems with urination or bowel movements. Pertinent History Reviewed:  Reviewed past medical,surgical, obstetrical and family history.  Reviewed problem list, medications and allergies. OB History  Gravida Para Term Preterm AB Living  1 1   1   1   SAB TAB Ectopic Multiple Live Births        0 1    # Outcome Date GA Lbr Len/2nd Weight Sex Delivery Anes PTL Lv  1 Preterm 06/01/19 [redacted]w[redacted]d 01:38 / 00:06 2 lb 7.2 oz (1.11 kg) F Vag-Spont None  LIV   Physical Assessment:   Vitals:   07/06/19 1340  BP: 118/77  Pulse: 66  Weight: 159 lb 9.6 oz (72.4 kg)  Height: 5\' 1"  (1.549 m)  Body mass index is 30.16 kg/m.       Physical Examination:   General appearance: alert, well appearing, and in no distress  Mental status: alert, oriented to person, place, and  time  Skin: warm & dry   Cardiovascular: normal heart rate noted   Respiratory: normal respiratory effort, no distress   Breasts: deferred, no complaints   Abdomen: soft, non-tender   Pelvic: IUD strings very long, trimmed  Rectal: no hemorrhoids  Extremities: no edema  Informal TA u/s: anteverted uterus w/ IUD in LUS/top part of cx per [employer] Discussed need for removal, pt wants another today  IUD REMOVAL/REINSERTION removal of a Liletta  IUD and insertion of a Liletta  IUD. Her IUD was placed 06/01/19 postplacentally.   The risks and benefits of the method and placement have been thouroughly reviewed with the patient and all questions were answered.  Specifically the patient is aware of failure rate of 03/998,  expulsion of the IUD and of possible perforation.  The patient is aware of irregular bleeding due to the method and understands the incidence of irregular bleeding diminishes with time.  Signed copy of informed consent in chart.   Time out was performed.  A graves speculum was placed in the vagina.  The cervix was visualized, prepped using Betadine. The strings were visible. They were grasped and the [place] IUD was easily removed. The cervix was then grasped with a single-tooth tenaculum. The uterus was found to be anteroflexed and it sounded to 8 cm.  Liletta  IUD placed per manufacturer's recommendations without complications. The strings were trimmed to approximately 3 cm.  The patient tolerated the procedure well.   Informal transvaginal sonogram was performed and the proper placement of the IUD was verified.   Chaperone: Levy Pupa   No orders of the defined types were placed in this encounter.       No results found for this or any previous visit (from the past 24 hour(s)).  Assessment & Plan:  1) Postpartum exam 2) 5 wks s/p SVB @ 28wks d/t incompetent cx/cerclage/PPROM/SOL 3) Breastfeeding/pumping 4) Depression screening, dep/anx, occ thoughts of self-harm> go away after she talks to boyfriend, declines meds, is going to call [facility] to schedule appt for therapy 5) Liletta IUD removal & Liletta insertion The patient was given post procedure instructions, including signs and symptoms of infection and to check for the strings after each menses or each month, and refraining from intercourse or anything in the vagina for 3 days. She was given a care card with date IUD placed, and date IUD to be removed. She is scheduled for a f/u appointment in 4 weeks.   Meds: No orders of the defined types were placed in this encounter.   Follow-up: Return in about 4 weeks (around 08/03/2019) for F/U, in person, CNM.   No orders of the defined types were placed in this encounter.   [place], [facility] 07/06/2019 2:34 PM

## 2019-07-06 NOTE — Addendum Note (Signed)
Addended by: Colen Darling on: 07/06/2019 03:09 PM   Modules accepted: Orders

## 2019-07-14 ENCOUNTER — Ambulatory Visit: Payer: Self-pay

## 2019-07-14 NOTE — Lactation Note (Deleted)
This note was copied from a baby's chart. Lactation Consultation Note  Patient Name: Audrey Matthews Today's Date: 07/14/2019 Reason for consult: Follow-up assessment;Preterm <34wks;NICU baby   LC spent a large time with mom and interpreter on IPAD_ used bc RN was in room using at the time.   Mom struggled with milk supply with previous child 1.20 yr old now.    Mom concerned about only getting 31ml per breast with each session.  Mom states she has consistently been pumping since infant was born 6 wks ago.  She does not pump in the middle of the night.     LC encouraged her to pump at night prior to bed but also to try pumping again after 4 hours if possible.  Several questions answered about milk supply and pumping.  LC offered to observe pumping session when she pumps here at the hospital.  [facility] notified RN that mom may be letting baby's RN know she desires lactation to observe session in the future.  LC encouraged mom to hand express after pumping.  Mom was unfamiliar with how to hand express and she felt she probably did not do it correctly.,  LC offered to demonstrate.  Colostrum container filled 1/2 way with mom's milk and mom demonstrated hand expression.  She was thrilled to express this much a matter of a minute and asked the interpreter/lactation if she had to use the DEBP or could she just hand express.  LC explained why it would be important to keep using the DEBP but to hand exp. After.  Mom understands good flange fit is important and an observation of her pumping could be helpful.    Mom has small,  Widely-spaced breast with small flat nipples and larger bulbous areolar tissue.  All questions answered and education provided on topics mom asked regarding pumping.   Maternal Data Has patient been taught Hand Expression?: Yes  Feeding Feeding Type: Breast Milk  LATCH Score                   Interventions    Lactation Tools Discussed/Used     Consult  Status Consult Status: PRN Follow-up type: In-patient    Maryruth Hancock [facility] 07/14/2019, 3:53 PM

## 2019-07-14 NOTE — Lactation Note (Signed)
This note was copied from a baby's chart. Lactation Consultation Note  Patient Name: Girl Ayva Milne Today's Date: 07/14/2019 Reason for consult: Follow-up assessment;Preterm <34wks;NICU baby    Mom requested lactation visit.  LC reviewed pumping with mom.  She states she was told to power pump once daily by [employer].  LC encouraged her to pump at least 8 times in 24 hours.   Mom states she has been power pumping once daily for 1 week.   Mom is concerned about flange fit.  24 flange provided per moms request.  She had 2 27 size flanges with her.  LC stated she felt the 24 may fit better on the left but mom said she felt it was the right that needed the 24.  LC reviewed that comfort is important and discussed what a proper fit will look like when pumping.  Mom had several questions about pumping and feeding infant eventually.  LC referred her to firstdroplets.com and the download of pumping and hands on pumping/ hand exp.    LC encouraged family to call out if she or dad had further concerns or questions.    Maternal Data Has patient been taught Hand Expression?: Yes  Feeding Feeding Type: Breast Milk  LATCH Score                   Interventions Interventions: DEBP;Hand express;Breast massage  Lactation Tools Discussed/Used     Consult Status Consult Status: PRN Follow-up type: In-patient    Maryruth Hancock [facility] 07/14/2019, 4:08 PM

## 2019-07-30 ENCOUNTER — Ambulatory Visit: Payer: Self-pay

## 2019-07-30 NOTE — Lactation Note (Signed)
This note was copied from a baby's chart. Lactation Consultation Note  Patient Name: Audrey Matthews Today's Date: 07/30/2019 Reason for consult: Follow-up assessment;Preterm <34wks;NICU baby;Mother's request  Visited with mom of an 31 week old pre-term NICU female < 4 lbs. G.A adjusted is 36 4/7. RN Eunice Blase called for [facility] assistance because mom was concerned about her supply; baby is on 24 calorie formula. Mom was giving baby a bottle when entering the room, she said the last two days have been very busy and she hasn't had time to pump consistently lately, she usually does is every 3 hours for 10-20 minutes at a time; but she only pumped once today and her supply is down to 2 ml per pumping session combined.  Mom said that is an improvement since she start getting the BF products from "milk mamas" a website her mother recommended, she told LC she used to get about 2 drops before starting eating these BF cookies, smoothies and brownies and now she's getting 2 mls. RN Eunice Blase explained to mom that the BF "window" may have passed, but that she can still pump more to encourage supply and demand.  Reviewed power pumping, hydration, galactagogues, and pumping schedule. Mom voiced that power pumping made her sore, asked her to start at her own pace but that supply and demand will be critical to bring some of her supply back. Mom has a [employer] DEBP at home and she said she's going to use the [employer] DEBP from the hospital when she comes to see baby.  Feeding plan:  1. Encouraged mom to start pumping consistently at least 8 times/24 hours  2. Mom will try power pumping in the AM and the PM 3. She'll continue doing galactagogues if they're helping 4. Baby will stay in 24 calorie calorie formula per NICU staff feeding plan  No support person other than mom in the room at the time of [facility] consultation. Mom reported all questions and concerns were answered, she's aware of LC OP services and will call  PRN.   Maternal Data    Feeding Feeding Type: Formula Nipple Type: Dr. Levert Feinstein Preemie  LATCH Score                   Interventions Interventions: Breast feeding basics reviewed;DEBP  Lactation Tools Discussed/Used Tools: Pump Breast pump type: Double-Electric Breast Pump   Consult Status Consult Status: PRN Follow-up type: In-patient     Venetia Constable 07/30/2019, 5:07 PM

## 2019-08-03 ENCOUNTER — Ambulatory Visit: Payer: Medicaid Other | Admitting: Women's Health

## 2019-08-10 ENCOUNTER — Encounter: Payer: Self-pay | Admitting: Advanced Practice Midwife

## 2019-08-10 ENCOUNTER — Ambulatory Visit (INDEPENDENT_AMBULATORY_CARE_PROVIDER_SITE_OTHER): Payer: Medicaid Other | Admitting: Advanced Practice Midwife

## 2019-08-10 ENCOUNTER — Other Ambulatory Visit: Payer: Self-pay

## 2019-08-10 VITALS — BP 128/79 | Ht 61.0 in | Wt 166.0 lb

## 2019-08-10 DIAGNOSIS — R55 Syncope and collapse: Secondary | ICD-10-CM | POA: Diagnosis not present

## 2019-08-10 DIAGNOSIS — R42 Dizziness and giddiness: Secondary | ICD-10-CM

## 2019-08-10 DIAGNOSIS — G8929 Other chronic pain: Secondary | ICD-10-CM

## 2019-08-10 DIAGNOSIS — M5441 Lumbago with sciatica, right side: Secondary | ICD-10-CM

## 2019-08-10 DIAGNOSIS — Z30431 Encounter for routine checking of intrauterine contraceptive device: Secondary | ICD-10-CM

## 2019-08-10 LAB — POCT HEMOGLOBIN: Hemoglobin: 11.2 g/dL (ref 11–14.6)

## 2019-08-10 NOTE — Progress Notes (Signed)
History:  19 y.o. G1P0101 here today for today for IUD string check; Liletta IUD was placed  4/15 (was in LUS at pp visit after placing postplacentallY). No complaints about the IUD, no concerning side effects.  States that since birth 2 months ago, she has passed out every time she has an orgasm w/her partner (about 6 times; during penetration, on her back).  Has not had sex in any other position.  Doesn't happen when she masturbates.  Boyfriend says she passes out for "up to two hours" (admittedly, he is "probably being dramatic" with the time, she feels like it's not that long, maybe 5-10 minutes.  Feels SOB, dizzy when she "awakes", difficulty speaking and forming words. .   C/O  "severe" right sided LBP since about [redacted] weeks pregnant; feels like pins and needles, sometimes radiates.  Not improved at all since giving birth.    The following portions of the patient's history were reviewed and updated as appropriate: allergies, current medications, past family history, past medical history, past social history, past surgical history and problem list.  Review of Systems:   Constitutional: Negative for fever and chills Eyes: Negative for visual disturbances Respiratory: Negative for shortness of breath, dyspnea Cardiovascular: Negative for chest pain or palpitations  Gastrointestinal: Negative for vomiting, diarrhea and constipation Genitourinary: Negative for dysuria and urgency Musculoskeletal: Negative for  joint pain, myalgias  Neurological: Negative for headaches    Objective:  Physical Exam Blood pressure 128/79, height 5\' 1"  (1.549 m), weight 166 lb (75.3 kg), not currently breastfeeding. Gen: NAD Abd: Soft, nontender and nondistended Pelvic: Bedside reveals properly place IUD. Strings visible w/pelvic  Assessment & Plan:  Normal IUD check. Patient to keep IUD in place for 6 years; can come in for removal if she desires pregnancy.   Syncope during orgasm:  Probably vasovagal  (discussed trying different positions), but will do neuro consult to r/o another source  Perisitent LBP:  Referral to PT

## 2019-08-23 DIAGNOSIS — J45909 Unspecified asthma, uncomplicated: Secondary | ICD-10-CM | POA: Insufficient documentation

## 2019-09-07 ENCOUNTER — Ambulatory Visit (HOSPITAL_COMMUNITY): Payer: Medicaid Other | Admitting: Physical Therapy

## 2019-09-20 ENCOUNTER — Ambulatory Visit (HOSPITAL_COMMUNITY): Payer: Medicaid Other | Admitting: Physical Therapy

## 2019-09-27 ENCOUNTER — Ambulatory Visit: Payer: Medicaid Other | Admitting: Neurology

## 2019-09-27 ENCOUNTER — Encounter: Payer: Self-pay | Admitting: Neurology

## 2019-09-27 ENCOUNTER — Other Ambulatory Visit: Payer: Self-pay

## 2019-09-27 VITALS — BP 115/73 | HR 66 | Ht 61.0 in | Wt 172.5 lb

## 2019-09-27 DIAGNOSIS — R251 Tremor, unspecified: Secondary | ICD-10-CM

## 2019-09-27 DIAGNOSIS — G43109 Migraine with aura, not intractable, without status migrainosus: Secondary | ICD-10-CM

## 2019-09-27 DIAGNOSIS — G43E09 Chronic migraine with aura, not intractable, without status migrainosus: Secondary | ICD-10-CM

## 2019-09-27 DIAGNOSIS — R404 Transient alteration of awareness: Secondary | ICD-10-CM | POA: Insufficient documentation

## 2019-09-27 HISTORY — DX: Tremor, unspecified: R25.1

## 2019-09-27 HISTORY — DX: Transient alteration of awareness: R40.4

## 2019-09-27 HISTORY — DX: Chronic migraine with aura, not intractable, without status migrainosus: G43.E09

## 2019-09-27 MED ORDER — LAMOTRIGINE 25 MG PO TABS: ORAL_TABLET | ORAL | 0 refills | Status: DC

## 2019-09-27 MED ORDER — LAMOTRIGINE 100 MG PO TABS: 100.0000 mg | ORAL_TABLET | Freq: Two times a day (BID) | ORAL | 11 refills | Status: DC

## 2019-09-27 MED ORDER — SUMATRIPTAN SUCCINATE 50 MG PO TABS: 50.0000 mg | ORAL_TABLET | ORAL | 6 refills | Status: DC | PRN

## 2019-09-27 NOTE — Progress Notes (Signed)
HISTORICAL  Audrey Matthews is a 19 year old female, seen in request by Audrey primary care from practice, [facility] for evaluation of body shaking episode, initial evaluation was on September 27, 2019, we were also able to connected to Audrey boyfriend via video call during conversation.  I reviewed and summarized the referring note. 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  Was treated with SSRI in the past, but no longer taking it.  She lives with Audrey Matthews of 2 months old with Audrey boyfriend, and Audrey Matthews.  She reported since she had Audrey Matthews, every time she has intercourse, she would pass out, it also happened occasionally while she was awake, she reported 1 episode 2 weeks ago she felt electric sensation running throughout Audrey head, Audrey body tensed up, whole body tremor without loss of consciousness, lasting for few minutes, she could not clearly recall what has happened,  Audrey boyfriend also reported over the past 2 weeks, sometimes she woke him up from sleep because Audrey whole body tremor, tensed up, he has to pick Audrey up in a standing position, then she would catch Audrey breath, returned back to normal within couple minutes, there was no tongue biting, urinary incontinence.  Patient stated no spells happen on a nightly basis, can even have multiple times every night, she can sleep better in a recliner.  She wants to be seen by sleep specialist, because of Audrey recurrent spells of waking up from sleep panicking, hungry for air, difficulty breathing  She denies lateralized motor or sensory deficit, she denies history of brain trauma, seizure, normal CBC, CMP, creatinine of 0.6, negative alcohol, hemoglobin of 12.2, UDS was negative on September 26, 2019, UA was normal  REVIEW OF SYSTEMS: Full 14 system review of systems performed and notable only for as above All other review of  systems were negative.  ALLERGIES: No Known Allergies  HOME MEDICATIONS: Current Outpatient Medications  Medication Sig Dispense Refill  . acetaminophen (TYLENOL) 500 MG tablet Take 500 mg by mouth every 6 (six) hours as needed for mild pain or headache.    . albuterol (VENTOLIN HFA) 108 (90 Base) MCG/ACT inhaler Inhale 2 puffs into the lungs every 6 (six) hours as needed for wheezing or shortness of breath.     . levonorgestrel (LILETTA, 52 MG,) 19.5 MCG/DAY IUD IUD 1 each by Intrauterine route once.    . prenatal vitamin w/FE, FA (PRENATAL 1 + 1) 27-1 MG TABS tablet Take 1 tablet by mouth daily at 12 noon. 30 tablet 12   No current facility-administered medications for this visit.    PAST MEDICAL HISTORY: Past Medical History:  Diagnosis Date  . ADHD (attention deficit hyperactivity disorder)   . Anxiety   . Asthma   . Autism   . Depression   . PTSD (post-traumatic stress disorder)   . Syncopal episodes     PAST SURGICAL HISTORY: Past Surgical History:  Procedure Laterality Date  . CAUTERIZE INNER NOSE    . CERVICAL CERCLAGE N/A 04/14/2019   Procedure: CERCLAGE CERVICAL;  Surgeon: Noralee Space, MD;  Location: [facility] LD ORS;  Service: Gynecology;  Laterality: N/A;  . DENTAL SURGERY    . MOUTH SURGERY      FAMILY HISTORY: Family History  Problem Relation Age of Onset  . Depression Matthews   . Anxiety disorder Matthews   . ADD / ADHD Matthews   .  Drug abuse Father   . Depression Sister   . Depression Maternal Grandmother   . Jaundice Matthews     SOCIAL HISTORY: Social History   Socioeconomic History  . Marital status: Single    Spouse name: Not on file  . Number of children: 1  . Years of education: Not on file  . Highest education level: Not on file  Occupational History  . 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 Smoking status: Never Smoker  . Smokeless tobacco: Never Used  Vaping Use  . Vaping Use: Never used  Substance and Sexual  Activity  . Alcohol use: Not Currently  . Drug use: No  . Sexual activity: Yes    Birth control/protection: I.U.D.  Other Topics Concern  . Not on file  Social History Narrative   Lives at home with Matthews during the week and boyfriend during the weekends.   She has one Matthews - born March 2021 (three month premature).   Right-handed.   No daily caffeine use.   Social Determinants of Health   Financial Resource Strain: Low Risk   . Difficulty of Paying Living Expenses: Not hard at all  Food Insecurity: No Food Insecurity  . Worried About Programme researcher, broadcasting/film/video in the Last Year: Never true  . Ran Out of Food in the Last Year: Never true  Transportation Needs: No Transportation Needs  . Lack of Transportation (Medical): No  . Lack of Transportation (Non-Medical): No  Physical Activity: Insufficiently Active  . Days of Exercise per Week: 4 days  . Minutes of Exercise per Session: 10 min  Stress: Stress Concern Present  . Feeling of Stress : Rather much  Social Connections: Moderately Isolated  . Frequency of Communication with Friends and Family: Three times a week  . Frequency of Social Gatherings with Friends and Family: Twice a week  . Attends Religious Services: Never  . Active Member of Clubs or Organizations: No  . Attends Banker Meetings: Never  . Marital Status: Living with partner  Intimate Partner Violence: Not At Risk  . Fear of Current or Ex-Partner: No  . Emotionally Abused: No  . Physically Abused: No  . Sexually Abused: No     PHYSICAL EXAM   Vitals:   09/27/19 1029  BP: 115/73  Pulse: 66  Weight: 172 lb 8 oz (78.2 kg)  Height: 5\' 1"  (1.549 m)   Not recorded     Body mass index is 32.59 kg/m.  PHYSICAL EXAMNIATION:  Gen: NAD, conversant, well nourised, well groomed                     Cardiovascular: Regular rate rhythm, no peripheral edema, warm, nontender. Eyes: Conjunctivae clear without exudates or hemorrhage Neck: Supple,  no carotid bruits. Pulmonary: Clear to auscultation bilaterally   NEUROLOGICAL EXAM:  MENTAL STATUS: Speech:    Speech is normal; fluent and spontaneous with normal comprehension.  Cognition:     Orientation to time, place and person     Normal recent and remote memory     Normal Attention span and concentration     Normal Language, naming, repeating,spontaneous speech     Fund of knowledge   CRANIAL NERVES: CN II: Visual fields are full to confrontation. Pupils are round equal and briskly reactive to light. CN III, IV, VI: extraocular movement are normal. No ptosis. CN V: Facial sensation is intact to light touch CN VII: Face is symmetric  with normal eye closure  CN VIII: Hearing is normal to causal conversation. CN IX, X: Phonation is normal. CN XI: Head turning and shoulder shrug are intact  MOTOR: There is no pronator drift of out-stretched arms. Muscle bulk and tone are normal. Muscle strength is normal.  REFLEXES: Reflexes are 2+ and symmetric at the biceps, triceps, knees, and ankles. Plantar responses are flexor.  SENSORY: Intact to light touch, pinprick and vibratory sensation are intact in fingers and toes.  COORDINATION: There is no trunk or limb dysmetria noted.  GAIT/STANCE: Posture is normal. Gait is steady with normal steps, base, arm swing, and turning. Heel and toe walking are normal. Tandem gait is normal.  Romberg is absent.   DIAGNOSTIC DATA (LABS, IMAGING, TESTING) - I reviewed patient records, labs, notes, testing and imaging myself where available.   ASSESSMENT AND PLAN  Sofiah Tallo is a 19 y.o. female   Body shaking episode New onset wake up from sleep body shaking, difficulty breathing since June 2021  Differentiation diagnosis include mood disorder related, versus seizure  MRI of the brain with without contrast  EEG  She desires sleep study, will also refer Audrey to sleep consultation  Laboratory evaluation check thyroid  function   Levert Feinstein, M.D. Ph.D.  [facility] [address] [place], [place] [address] Ph: [phone] Fax: [phone]  CC:  Practice, [facility]

## 2019-09-28 LAB — THYROID PANEL WITH TSH
Free Thyroxine Index: 2 (ref 1.2–4.9)
T3 Uptake Ratio: 27 % (ref 24–39)
T4, Total: 7.3 ug/dL (ref 4.5–12.0)
TSH: 3.05 u[IU]/mL (ref 0.450–4.500)

## 2019-10-02 ENCOUNTER — Telehealth: Payer: Self-pay | Admitting: Neurology

## 2019-10-02 NOTE — Telephone Encounter (Signed)
medicaid no auth per [place] at [facility]. Order sent to [facility]. They will reach out to the patient to schedule.

## 2019-10-18 ENCOUNTER — Other Ambulatory Visit: Payer: Medicaid Other

## 2019-11-14 ENCOUNTER — Institutional Professional Consult (permissible substitution): Payer: Medicaid Other | Admitting: Neurology

## 2019-11-20 ENCOUNTER — Ambulatory Visit (INDEPENDENT_AMBULATORY_CARE_PROVIDER_SITE_OTHER): Payer: Medicaid Other | Admitting: Women's Health

## 2019-11-20 ENCOUNTER — Other Ambulatory Visit (HOSPITAL_COMMUNITY)
Admission: RE | Admit: 2019-11-20 | Discharge: 2019-11-20 | Disposition: A | Discharge status: Home / Self Care | Payer: Medicaid Other | Source: Ambulatory Visit | Attending: Obstetrics & Gynecology | Admitting: Obstetrics & Gynecology

## 2019-11-20 ENCOUNTER — Encounter: Payer: Self-pay | Admitting: Women's Health

## 2019-11-20 VITALS — BP 146/87 | HR 96 | Ht 61.0 in | Wt 180.0 lb

## 2019-11-20 DIAGNOSIS — N898 Other specified noninflammatory disorders of vagina: Secondary | ICD-10-CM

## 2019-11-20 DIAGNOSIS — N941 Unspecified dyspareunia: Secondary | ICD-10-CM | POA: Diagnosis not present

## 2019-11-20 DIAGNOSIS — R030 Elevated blood-pressure reading, without diagnosis of hypertension: Secondary | ICD-10-CM | POA: Diagnosis not present

## 2019-11-20 NOTE — Progress Notes (Signed)
   GYN VISIT Patient name: Audrey Matthews MRN [id number]  Date of birth: [birth year] Chief Complaint:   Contraception (IUD Check)  History of Present Illness:   Audrey Matthews is a 19 y.o. G103P0101 Caucasian female being seen today for sharp pelvic pain w/ sex that began 97/10. Happens when she is on top. Thought it could be her IUD.  Also vaginal odor for few weeks, no itching/irritation. Has tried vagisil and other otc relief measures, helps for about an hour, then returns. No itching/irritation.  BP up today, no h/o same. Reports being anxious, has court in the morning.     Depression screen [facility] 2/9 02/09/2019 12/27/2018  Decreased Interest 0 1  Down, Depressed, Hopeless 0 1  PHQ - 2 Score 0 2  Altered sleeping 1 0  Tired, decreased energy 1 1  Change in appetite 0 2  Feeling bad or failure about yourself  0 1  Trouble concentrating 2 0  Moving slowly or fidgety/restless 3 1  Suicidal thoughts 0 0  PHQ-9 Score 7 7  Difficult doing work/chores Somewhat difficult -    No LMP recorded. (Menstrual status: IUD). The current method of family planning is Liletta IUD inserted 07/06/19.  Last pap <21yo. Results were:  n/a Review of Systems:   Pertinent items are noted in HPI Denies fever/chills, dizziness, headaches, visual disturbances, fatigue, shortness of breath, chest pain, abdominal pain, vomiting, abnormal vaginal discharge/itching/odor/irritation, problems with periods, bowel movements, urination, or intercourse unless otherwise stated above.  Pertinent History Reviewed:  Reviewed past medical,surgical, social, obstetrical and family history.  Reviewed problem list, medications and allergies. Physical Assessment:   Vitals:   11/20/19 1534 11/20/19 1552  BP: 140/87 (!) 146/87  Pulse: 96   Weight: 180 lb (81.6 kg)   Height: 5\' 1"  (1.549 m)   Body mass index is 34.01 kg/m.       Physical Examination:   General appearance: alert, well appearing, and in no distress  Mental status:  alert, oriented to person, place, and time  Skin: warm & dry   Cardiovascular: normal heart rate noted  Respiratory: normal respiratory effort, no distress  Abdomen: soft, non-tender   Pelvic: VULVA: normal appearing vulva with no masses, tenderness or lesions, VAGINA: normal appearing vagina with normal color and discharge, no lesions, CERVIX: normal appearing cervix without discharge or lesions, IUD strings visible, appropriate length, no part of IUD visible  Extremities: no edema   Informal TA u/s: reveals IUD in correct fundal placement w/in endometrium  Chaperone: Amanda Rash    No results found for this or any previous visit (from the past 24 hour(s)).  Assessment & Plan:  1) Dyspareunia w/ vaginal odor> IUD in placed, CV swab sent  2) Elevated bp> reports anxiety about court in the am, likely this, to f/u w/ PCP  Meds: No orders of the defined types were placed in this encounter.   No orders of the defined types were placed in this encounter.   Return for after 6/26 for , Pap & physical.  7/26 CNM, [facility] 11/20/2019 3:56 PM

## 2019-11-22 ENCOUNTER — Other Ambulatory Visit: Payer: Self-pay | Admitting: Women's Health

## 2019-11-22 LAB — CERVICOVAGINAL ANCILLARY ONLY
Bacterial Vaginitis (gardnerella): POSITIVE — AB
Candida Glabrata: NEGATIVE
Candida Vaginitis: NEGATIVE
Chlamydia: NEGATIVE
Comment: NEGATIVE
Comment: NEGATIVE
Comment: NEGATIVE
Comment: NEGATIVE
Comment: NEGATIVE
Comment: NORMAL
Neisseria Gonorrhea: NEGATIVE
Trichomonas: NEGATIVE

## 2019-11-22 MED ORDER — METRONIDAZOLE 500 MG PO TABS: 500.0000 mg | ORAL_TABLET | Freq: Two times a day (BID) | ORAL | 0 refills | Status: DC

## 2019-11-23 ENCOUNTER — Encounter (HOSPITAL_COMMUNITY): Payer: Self-pay | Admitting: Emergency Medicine

## 2019-11-23 ENCOUNTER — Other Ambulatory Visit: Payer: Self-pay

## 2019-11-23 DIAGNOSIS — J45909 Unspecified asthma, uncomplicated: Secondary | ICD-10-CM | POA: Diagnosis not present

## 2019-11-23 DIAGNOSIS — Z79899 Other long term (current) drug therapy: Secondary | ICD-10-CM | POA: Diagnosis not present

## 2019-11-23 DIAGNOSIS — M549 Dorsalgia, unspecified: Secondary | ICD-10-CM | POA: Diagnosis not present

## 2019-11-23 DIAGNOSIS — F0390 Unspecified dementia without behavioral disturbance: Secondary | ICD-10-CM | POA: Insufficient documentation

## 2019-11-23 DIAGNOSIS — B9689 Other specified bacterial agents as the cause of diseases classified elsewhere: Secondary | ICD-10-CM

## 2019-11-23 DIAGNOSIS — Z3202 Encounter for pregnancy test, result negative: Secondary | ICD-10-CM | POA: Insufficient documentation

## 2019-11-23 DIAGNOSIS — F909 Attention-deficit hyperactivity disorder, unspecified type: Secondary | ICD-10-CM | POA: Diagnosis not present

## 2019-11-23 DIAGNOSIS — R1031 Right lower quadrant pain: Secondary | ICD-10-CM | POA: Insufficient documentation

## 2019-11-23 HISTORY — DX: Other specified bacterial agents as the cause of diseases classified elsewhere: B96.89

## 2019-11-23 NOTE — ED Triage Notes (Addendum)
Pt c/o of abdominal pain on the lower right side at 2100. Pt denies alcohol use. Denies vomiting and diarrhea. Patient was Dx with BV yesterday.

## 2019-11-24 ENCOUNTER — Emergency Department (HOSPITAL_COMMUNITY): Payer: Medicaid Other

## 2019-11-24 ENCOUNTER — Emergency Department (HOSPITAL_COMMUNITY)
Admission: EM | Admit: 2019-11-24 | Discharge: 2019-11-24 | Disposition: A | Discharge status: Home / Self Care | Payer: Medicaid Other | Attending: Emergency Medicine | Admitting: Emergency Medicine

## 2019-11-24 DIAGNOSIS — R102 Pelvic and perineal pain: Secondary | ICD-10-CM

## 2019-11-24 DIAGNOSIS — R1031 Right lower quadrant pain: Secondary | ICD-10-CM

## 2019-11-24 LAB — URINALYSIS, ROUTINE W REFLEX MICROSCOPIC
Bilirubin Urine: NEGATIVE
Glucose, UA: NEGATIVE mg/dL
Ketones, ur: NEGATIVE mg/dL
Nitrite: NEGATIVE
Protein, ur: 30 mg/dL — AB
RBC / HPF: >50 RBC/hpf — ABNORMAL HIGH (ref 0–5)
Specific Gravity, Urine: 1.029 (ref 1.005–1.030)
pH: 5 (ref 5.0–8.0)

## 2019-11-24 LAB — LIPASE, BLOOD: Lipase: 22 U/L (ref 11–51)

## 2019-11-24 LAB — COMPREHENSIVE METABOLIC PANEL
ALT: 22 U/L (ref 0–44)
AST: 19 U/L (ref 15–41)
Albumin: 4.2 g/dL (ref 3.5–5.0)
Alkaline Phosphatase: 54 U/L (ref 38–126)
Anion gap: 10 (ref 5–15)
BUN: 9 mg/dL (ref 6–20)
CO2: 22 mmol/L (ref 22–32)
Calcium: 9.1 mg/dL (ref 8.9–10.3)
Chloride: 105 mmol/L (ref 98–111)
Creatinine, Ser: 0.6 mg/dL (ref 0.44–1.00)
GFR calc Af Amer: >60 mL/min (ref >60)
GFR calc non Af Amer: >60 mL/min (ref >60)
Glucose, Bld: 89 mg/dL (ref 70–99)
Potassium: 4 mmol/L (ref 3.5–5.1)
Sodium: 137 mmol/L (ref 135–145)
Total Bilirubin: 0.3 mg/dL (ref 0.3–1.2)
Total Protein: 7.8 g/dL (ref 6.5–8.1)

## 2019-11-24 LAB — CBC
HCT: 39.9 % (ref 36.0–46.0)
Hemoglobin: 12.4 g/dL (ref 12.0–15.0)
MCH: 25.9 pg — ABNORMAL LOW (ref 26.0–34.0)
MCHC: 31.1 g/dL (ref 30.0–36.0)
MCV: 83.3 fL (ref 80.0–100.0)
Platelets: 348 10*3/uL (ref 150–400)
RBC: 4.79 MIL/uL (ref 3.87–5.11)
RDW: 14.6 % (ref 11.5–15.5)
WBC: 8.4 10*3/uL (ref 4.0–10.5)
nRBC: 0 % (ref 0.0–0.2)

## 2019-11-24 LAB — POC URINE PREG, ED: Preg Test, Ur: NEGATIVE

## 2019-11-24 LAB — WET PREP, GENITAL
Clue Cells Wet Prep HPF POC: NONE SEEN
Sperm: NONE SEEN
Trich, Wet Prep: NONE SEEN
Yeast Wet Prep HPF POC: NONE SEEN

## 2019-11-24 MED ORDER — FENTANYL CITRATE (PF) 100 MCG/2ML IJ SOLN
50.0000 ug | Freq: Once | INTRAMUSCULAR | Status: AC
  Administered 2019-11-24: 50 ug via INTRAVENOUS
  Filled 2019-11-24: qty 2

## 2019-11-24 MED ORDER — ONDANSETRON HCL 4 MG/2ML IJ SOLN
4.0000 mg | Freq: Once | INTRAMUSCULAR | Status: AC
  Administered 2019-11-24: 4 mg via INTRAVENOUS
  Filled 2019-11-24: qty 2

## 2019-11-24 MED ORDER — IOHEXOL 300 MG/ML  SOLN
100.0000 mL | Freq: Once | INTRAMUSCULAR | Status: AC | PRN
  Administered 2019-11-24: 100 mL via INTRAVENOUS

## 2019-11-24 NOTE — ED Notes (Signed)
Pt does not want to stay for her [place]. Pt wanting leave AMA. Discussed benefits and risks. Pt still wanting to leave. Pt signed out AMA

## 2019-11-24 NOTE — ED Provider Notes (Signed)
[facility] EMERGENCY DEPARTMENT Provider Note   CSN: [id number] Arrival date & time: 11/23/19  2215     History Chief Complaint  Patient presents with  . Abdominal Pain    Audrey Matthews is a 19 y.o. female.  Patient with sudden onset of right-sided abdominal pain while she was getting out of a car about 8 or 9 PM.  She reports the pain was sharp and stabbing brought her to her knees.  The pain was constant for about 4 hours and starting to improve but now spread across her entire abdomen.  The pain has since spread across her entire lower abdomen is now worse in the middle.  She has never had this kind of pain before.  It is constant.  Nothing makes it better or worse.  No pain with urination or blood in the urine.  No vomiting.  Pain does radiate to her low back.  Ended her menstrual cycle 3 days ago and has not had any abnormal bleeding.  Has an IUD. No previous abdominal surgeries. She was seen at the gynecologist on August 30 to "check my IUD" and having dyspareunia.  She denies having this pain then.  She has never had this kind of pain before.  She took ibuprofen without relief. She was found to have bacterial vaginosis and is currently taking Flagyl  The history is provided by the patient.  Abdominal Pain Associated symptoms: no chest pain, no cough, no dysuria, no fever, no nausea, no shortness of breath, no vaginal bleeding, no vaginal discharge and no vomiting        Past Medical History:  Diagnosis Date  . ADHD (attention deficit hyperactivity disorder)   . Anxiety   . Asthma   . Autism   . Bacterial vaginosis 11/23/2019  . Depression   . PTSD (post-traumatic stress disorder)   . Syncopal episodes     Patient Active Problem List   Diagnosis Date Noted  . Alteration consciousness 09/27/2019  . Episode of shaking 09/27/2019  . Chronic migraine with aura 09/27/2019  . History of preterm delivery 07/06/2019  . H/O cervical incompetence 07/06/2019  . Encounter  for IUD insertion 07/06/2019  . Asthma   . Borderline personality disorder (HCC) 06/03/2018  . Attention deficit hyperactivity disorder (ADHD) 10/04/2015  . MDD (major depressive disorder), recurrent episode, severe (HCC) 10/03/2015  . Autism spectrum disorder 12/25/2010  . Chronic post-traumatic stress disorder 12/25/2010    Past Surgical History:  Procedure Laterality Date  . CAUTERIZE INNER NOSE    . CERVICAL CERCLAGE N/A 04/14/2019   Procedure: CERCLAGE CERVICAL;  Surgeon: Noralee Space, MD;  Location: [facility] LD ORS;  Service: Gynecology;  Laterality: N/A;  . DENTAL SURGERY    . MOUTH SURGERY       OB History    Gravida  1   Para  1   Term      Preterm  1   AB      Living  1     SAB      TAB      Ectopic      Multiple  0   Live Births  1           Family History  Problem Relation Age of Onset  . Depression Mother   . Anxiety disorder Mother   . ADD / ADHD Mother   . Drug abuse Father   . Depression Sister   . Depression Maternal Grandmother   . Jaundice Daughter  Social History   Tobacco Use  . Smoking status: Never Smoker  . Smokeless tobacco: Never Used  Vaping Use  . Vaping Use: Never used  Substance Use Topics  . Alcohol use: Not Currently  . Drug use: No    Home Medications Prior to Admission medications   Medication Sig Start Date End Date Taking? Authorizing Provider  acetaminophen (TYLENOL) 500 MG tablet Take 500 mg by mouth every 6 (six) hours as needed for mild pain or headache. Patient not taking: Reported on 11/20/2019    [provider]  albuterol (VENTOLIN HFA) 108 (90 Base) MCG/ACT inhaler Inhale 2 puffs into the lungs every 6 (six) hours as needed for wheezing or shortness of breath.  Patient not taking: Reported on 11/20/2019 12/20/18   [provider]  lamoTRIgine (LAMICTAL) 100 MG tablet Take 1 tablet (100 mg total) by mouth 2 (two) times daily. 09/27/19   Levert Feinstein, MD  lamoTRIgine (LAMICTAL) 25 MG  tablet 1 tab bid x one week 2 tab bid x 2nd week 3 tab bid x 3rd week Patient not taking: Reported on 11/20/2019 09/27/19   Levert Feinstein, MD  levonorgestrel (LILETTA, 52 MG,) 19.5 MCG/DAY IUD IUD 1 each by Intrauterine route once.    [provider]  metroNIDAZOLE (FLAGYL) 500 MG tablet Take 1 tablet (500 mg total) by mouth 2 (two) times daily. 11/22/19   Cheral Marker, CNM  prenatal vitamin w/FE, FA (PRENATAL 1 + 1) 27-1 MG TABS tablet Take 1 tablet by mouth daily at 12 noon. Patient not taking: Reported on 11/20/2019 12/27/18   Adline Potter, NP  SUMAtriptan (IMITREX) 50 MG tablet Take 1 tablet (50 mg total) by mouth every 2 (two) hours as needed for migraine. May repeat in 2 hours if headache persists or recurs. Patient not taking: Reported on 11/20/2019 09/27/19   Levert Feinstein, MD    Allergies    Patient has no known allergies.  Review of Systems   Review of Systems  Constitutional: Negative for activity change, appetite change and fever.  HENT: Negative for congestion.   Respiratory: Negative for cough, chest tightness and shortness of breath.   Cardiovascular: Negative for chest pain.  Gastrointestinal: Positive for abdominal pain. Negative for nausea and vomiting.  Genitourinary: Positive for pelvic pain. Negative for dysuria, flank pain, vaginal bleeding and vaginal discharge.  Musculoskeletal: Positive for back pain.  Skin: Negative for rash.  Neurological: Negative for dizziness, facial asymmetry, weakness, light-headedness and headaches.   all other systems are negative except as noted in the HPI and PMH.    Physical Exam Updated Vital Signs BP 132/88 (BP Location: Right Arm)   Pulse (!) 103   Temp 98.6 F (37 C) (Oral)   Resp 16   Ht 5\' 1"  (1.549 m)   Wt 81.6 kg   SpO2 100%   BMI 34.01 kg/m   Physical Exam Vitals and nursing note reviewed.  Constitutional:      General: She is not in acute distress.    Appearance: She is well-developed.     Comments:  Talking on the phone, no distress  HENT:     Head: Normocephalic and atraumatic.     Mouth/Throat:     Pharynx: No oropharyngeal exudate.  Eyes:     Conjunctiva/sclera: Conjunctivae normal.     Pupils: Pupils are equal, round, and reactive to light.  Neck:     Comments: No meningismus. Cardiovascular:     Rate and Rhythm: Normal rate and  regular rhythm.     Heart sounds: Normal heart sounds. No murmur heard.   Pulmonary:     Effort: Pulmonary effort is normal. No respiratory distress.     Breath sounds: Normal breath sounds.  Abdominal:     Palpations: Abdomen is soft.     Tenderness: There is abdominal tenderness. There is no guarding or rebound.     Comments: Tenderness across abdomen diffusely.  Worst suprapubically. No pain at McBurney's point.  No guarding or rebound.  Genitourinary:    Comments: Chaperone present Raynelle Fanning NT.  Normal external genitalia.  Scattered blood in vaginal vault.  IUD strings in place.  No CMT.  No lateralized adnexal tenderness.  Does have suprapubic pain and diffuse pain across lower abdomen. Musculoskeletal:        General: No tenderness. Normal range of motion.     Cervical back: Normal range of motion and neck supple.     Comments: No CVAT  Skin:    General: Skin is warm.  Neurological:     Mental Status: She is alert and oriented to person, place, and time.     Cranial Nerves: No cranial nerve deficit.     Motor: No abnormal muscle tone.     Coordination: Coordination normal.     Comments: No ataxia on finger to nose bilaterally. No pronator drift. 5/5 strength throughout. CN 2-12 intact.Equal grip strength. Sensation intact.   Psychiatric:        Behavior: Behavior normal.     ED Results / Procedures / Treatments   Labs (all labs ordered are listed, but only abnormal results are displayed) Labs Reviewed  WET PREP, GENITAL - Abnormal; Notable for the following components:      Result Value   WBC, Wet Prep HPF POC FEW (*)    All other  components within normal limits  CBC - Abnormal; Notable for the following components:   MCH 25.9 (*)    All other components within normal limits  URINALYSIS, ROUTINE W REFLEX MICROSCOPIC - Abnormal; Notable for the following components:   APPearance HAZY (*)    Hgb urine dipstick LARGE (*)    Protein, ur 30 (*)    Leukocytes,Ua SMALL (*)    RBC / HPF >50 (*)    Bacteria, UA RARE (*)    All other components within normal limits  URINE CULTURE  LIPASE, BLOOD  COMPREHENSIVE METABOLIC PANEL  POC URINE PREG, ED  GC/CHLAMYDIA PROBE AMP () NOT AT [facility]    EKG None  Radiology CT ABDOMEN PELVIS W CONTRAST  Result Date: 11/24/2019 CLINICAL DATA:  Right lower quadrant abdominal pain EXAM: CT ABDOMEN AND PELVIS WITH CONTRAST TECHNIQUE: Multidetector CT imaging of the abdomen and pelvis was performed using the standard protocol following bolus administration of intravenous contrast. CONTRAST:  OMNIPAQUE IOHEXOL 300 MG/ML  SOLN COMPARISON:  None. FINDINGS: Lower chest:  No contributory findings. Hepatobiliary: No focal liver abnormality.No evidence of biliary obstruction or stone. Pancreas: Unremarkable. Spleen: Unremarkable. Adrenals/Urinary Tract: Negative adrenals. No hydronephrosis or stone. Unremarkable bladder. Stomach/Bowel:  No obstruction. No appendicitis. Vascular/Lymphatic: No acute vascular abnormality. No mass or adenopathy. Reproductive:IUD in expected position. Other: No ascites or pneumoperitoneum. Musculoskeletal: No acute abnormalities. IMPRESSION: Negative abdominal CT.  No appendicitis. Electronically Signed   By: Marnee Spring M.D.   On: 11/24/2019 05:55    Procedures Procedures (including critical care time)  Medications Ordered in ED Medications - No data to display  ED Course  I have reviewed  the triage vital signs and the nursing notes.  Pertinent labs & imaging results that were available during my care of the patient were reviewed by me and  considered in my medical decision making (see chart for details).    MDM Rules/Calculators/A&P                         Right-sided lower abdominal pain that has spread across her entire lower abdomen.  Abdomen soft without peritoneal signs.  HCG negative. UA with blood in it but patient on menses.  Labs reassuring.  Pain improved after medication in the ED.  She has no right lower quadrant tenderness.  CT scan is reassuring.  No appendicitis.  No kidney stone.  Patient feels improved and states her pain is minimal.  She does still have some right-sided lower abdominal tenderness.  With concern for sudden onset right lower quadrant pain, will obtain ultrasound to rule out ovarian torsion.  Discussed patient may have passed a kidney stone.  Discussed this may be early appendicitis not yet seen on CT scan.  No evidence of UTI but culture will be sent.  Ultrasound pending at shift change.  Follow-up with PCP as well as gynecology.  Return to the ED if worsening pain especially on the right lower abdomen, fever, vomiting, other concerns. Final Clinical Impression(s) / ED Diagnoses Final diagnoses:  Pelvic pain in female  RLQ abdominal pain    Rx / DC Orders ED Discharge Orders    None       , Jeannett SeniorStephen, MD 11/24/19 [phone]

## 2019-11-24 NOTE — Discharge Instructions (Addendum)
Your testing is negative for appendicitis or kidney stone.  It is possible you may have already passed a kidney stone. Follow-up with your doctor.  Return to the ED with worsening pain, fever, vomiting, any other concerns.

## 2019-11-26 LAB — URINE CULTURE: Culture: <10000 — AB

## 2019-11-28 LAB — GC/CHLAMYDIA PROBE AMP (~~LOC~~) NOT AT ARMC
Chlamydia: NEGATIVE
Comment: NEGATIVE
Comment: NORMAL
Neisseria Gonorrhea: NEGATIVE

## 2019-12-05 ENCOUNTER — Ambulatory Visit: Payer: Medicaid Other | Admitting: Obstetrics & Gynecology

## 2019-12-23 ENCOUNTER — Encounter (HOSPITAL_COMMUNITY): Payer: Self-pay | Admitting: Obstetrics and Gynecology

## 2019-12-23 ENCOUNTER — Other Ambulatory Visit: Payer: Self-pay

## 2019-12-23 ENCOUNTER — Inpatient Hospital Stay (HOSPITAL_COMMUNITY)
Admission: AD | Admit: 2019-12-23 | Discharge: 2019-12-23 | Disposition: A | Discharge status: Home / Self Care | Payer: Medicaid Other | Attending: Obstetrics and Gynecology | Admitting: Obstetrics and Gynecology

## 2019-12-23 DIAGNOSIS — Z79899 Other long term (current) drug therapy: Secondary | ICD-10-CM | POA: Diagnosis not present

## 2019-12-23 DIAGNOSIS — N939 Abnormal uterine and vaginal bleeding, unspecified: Secondary | ICD-10-CM | POA: Insufficient documentation

## 2019-12-23 DIAGNOSIS — F840 Autistic disorder: Secondary | ICD-10-CM | POA: Insufficient documentation

## 2019-12-23 DIAGNOSIS — F329 Major depressive disorder, single episode, unspecified: Secondary | ICD-10-CM | POA: Diagnosis not present

## 2019-12-23 DIAGNOSIS — R109 Unspecified abdominal pain: Secondary | ICD-10-CM

## 2019-12-23 DIAGNOSIS — Z975 Presence of (intrauterine) contraceptive device: Secondary | ICD-10-CM | POA: Insufficient documentation

## 2019-12-23 DIAGNOSIS — Z30431 Encounter for routine checking of intrauterine contraceptive device: Secondary | ICD-10-CM | POA: Diagnosis not present

## 2019-12-23 DIAGNOSIS — Z793 Long term (current) use of hormonal contraceptives: Secondary | ICD-10-CM | POA: Diagnosis not present

## 2019-12-23 DIAGNOSIS — J45909 Unspecified asthma, uncomplicated: Secondary | ICD-10-CM | POA: Diagnosis not present

## 2019-12-23 DIAGNOSIS — F4310 Post-traumatic stress disorder, unspecified: Secondary | ICD-10-CM | POA: Diagnosis not present

## 2019-12-23 LAB — CBC
HCT: 40.5 % (ref 36.0–46.0)
Hemoglobin: 13 g/dL (ref 12.0–15.0)
MCH: 26.3 pg (ref 26.0–34.0)
MCHC: 32.1 g/dL (ref 30.0–36.0)
MCV: 82 fL (ref 80.0–100.0)
Platelets: 320 10*3/uL (ref 150–400)
RBC: 4.94 MIL/uL (ref 3.87–5.11)
RDW: 14.1 % (ref 11.5–15.5)
WBC: 9.7 10*3/uL (ref 4.0–10.5)
nRBC: 0 % (ref 0.0–0.2)

## 2019-12-23 LAB — URINALYSIS, ROUTINE W REFLEX MICROSCOPIC
Bacteria, UA: NONE SEEN
Bilirubin Urine: NEGATIVE
Glucose, UA: NEGATIVE mg/dL
Ketones, ur: NEGATIVE mg/dL
Nitrite: NEGATIVE
Protein, ur: NEGATIVE mg/dL
Specific Gravity, Urine: 1.016 (ref 1.005–1.030)
pH: 8 (ref 5.0–8.0)

## 2019-12-23 LAB — POCT PREGNANCY, URINE: Preg Test, Ur: NEGATIVE

## 2019-12-23 NOTE — MAU Note (Signed)
Audrey Matthews is a 19 y.o. here in MAU reporting:  +abdominal pain Cramping Ongoing; comes in waves  +vaginal bleeding States she has been bleeding for a month  Pain score: 1/10 Vitals:   12/23/19 1537  BP: 128/71  Pulse: 89  Resp: 16  Temp: 98.2 F (36.8 C)  SpO2: 100%

## 2019-12-23 NOTE — Discharge Instructions (Signed)
Levonorgestrel intrauterine device (IUD) What is this medicine? LEVONORGESTREL IUD (LEE voe nor jes trel) is a contraceptive (birth control) device. The device is placed inside the uterus by a healthcare professional. It is used to prevent pregnancy. This device can also be used to treat heavy bleeding that occurs during your period. This medicine may be used for other purposes; ask your health care provider or pharmacist if you have questions. COMMON BRAND NAME(S): Kyleena, LILETTA, Mirena, Skyla What should I tell my health care provider before I take this medicine? They need to know if you have any of these conditions:  abnormal Pap smear  cancer of the breast, uterus, or cervix  diabetes  endometritis  genital or pelvic infection now or in the past  have more than one sexual partner or your partner has more than one partner  heart disease  history of an ectopic or tubal pregnancy  immune system problems  IUD in place  liver disease or tumor  problems with blood clots or take blood-thinners  seizures  use intravenous drugs  uterus of unusual shape  vaginal bleeding that has not been explained  an unusual or allergic reaction to levonorgestrel, other hormones, silicone, or polyethylene, medicines, foods, dyes, or preservatives  pregnant or trying to get pregnant  breast-feeding How should I use this medicine? This device is placed inside the uterus by a health care professional. Talk to your pediatrician regarding the use of this medicine in children. Special care may be needed. Overdosage: If you think you have taken too much of this medicine contact a poison control center or emergency room at once. NOTE: This medicine is only for you. Do not share this medicine with others. What if I miss a dose? This does not apply. Depending on the brand of device you have inserted, the device will need to be replaced every 3 to 6 years if you wish to continue using this type  of birth control. What may interact with this medicine? Do not take this medicine with any of the following medications:  amprenavir  bosentan  fosamprenavir This medicine may also interact with the following medications:  aprepitant  armodafinil  barbiturate medicines for inducing sleep or treating seizures  bexarotene  boceprevir  griseofulvin  medicines to treat seizures like carbamazepine, ethotoin, felbamate, oxcarbazepine, phenytoin, topiramate  modafinil  pioglitazone  rifabutin  rifampin  rifapentine  some medicines to treat HIV infection like atazanavir, efavirenz, indinavir, lopinavir, nelfinavir, tipranavir, ritonavir  St. John's wort  warfarin This list may not describe all possible interactions. Give your health care provider a list of all the medicines, herbs, non-prescription drugs, or dietary supplements you use. Also tell them if you smoke, drink alcohol, or use illegal drugs. Some items may interact with your medicine. What should I watch for while using this medicine? Visit your doctor or health care professional for regular check ups. See your doctor if you or your partner has sexual contact with others, becomes HIV positive, or gets a sexual transmitted disease. This product does not protect you against HIV infection (AIDS) or other sexually transmitted diseases. You can check the placement of the IUD yourself by reaching up to the top of your vagina with clean fingers to feel the threads. Do not pull on the threads. It is a good habit to check placement after each menstrual period. Call your doctor right away if you feel more of the IUD than just the threads or if you cannot feel the threads at   all. The IUD may come out by itself. You may become pregnant if the device comes out. If you notice that the IUD has come out use a backup birth control method like condoms and call your health care provider. Using tampons will not change the position of the  IUD and are okay to use during your period. This IUD can be safely scanned with magnetic resonance imaging (MRI) only under specific conditions. Before you have an MRI, tell your healthcare provider that you have an IUD in place, and which type of IUD you have in place. What side effects may I notice from receiving this medicine? Side effects that you should report to your doctor or health care professional as soon as possible:  allergic reactions like skin rash, itching or hives, swelling of the face, lips, or tongue  fever, flu-like symptoms  genital sores  high blood pressure  no menstrual period for 6 weeks during use  pain, swelling, warmth in the leg  pelvic pain or tenderness  severe or sudden headache  signs of pregnancy  stomach cramping  sudden shortness of breath  trouble with balance, talking, or walking  unusual vaginal bleeding, discharge  yellowing of the eyes or skin Side effects that usually do not require medical attention (report to your doctor or health care professional if they continue or are bothersome):  acne  breast pain  change in sex drive or performance  changes in weight  cramping, dizziness, or faintness while the device is being inserted  headache  irregular menstrual bleeding within first 3 to 6 months of use  nausea This list may not describe all possible side effects. Call your doctor for medical advice about side effects. You may report side effects to FDA at 1-800-FDA-1088. Where should I keep my medicine? This does not apply. NOTE: This sheet is a summary. It may not cover all possible information. If you have questions about this medicine, talk to your doctor, pharmacist, or health care provider.  2020 Elsevier/Gold Standard (2018-01-18 13:22:01)  

## 2019-12-23 NOTE — MAU Provider Note (Signed)
History     CSN: [id number]  Arrival date and time: 12/23/19 1442   First Provider Initiated Contact with Patient 12/23/19 1549      Chief Complaint  Patient presents with  . Abdominal Pain  . Vaginal Bleeding   HPI Audrey Matthews is a 19 y.o. G1P0101 patient who presents to MAU for evaluation of "continuous bleeding" and abdominal cramping, as well as concern for malpositioned IUD. Patient states she has had bleeding and abdominal pain to some degree for all of the September. She reports that 3-4 days ago she bled so heavily that blood was running down her leg and she had to change her pad every 15 minutes. Pads were not saturated when she changed them. She states her heavy bleeding spontaneously resolved after about 4 pads and has been scant since then.  Patient 's abdominal pain is suprapubic, crampy. Pain score 1/10. Has not taken medication for this complaint.  She denies abdominal tenderness, dysuria, abnormal vaginal discharge. Most recent intercourse 3-4 days ago.  OB History    Gravida  1   Para  1   Term      Preterm  1   AB      Living  1     SAB      TAB      Ectopic      Multiple  0   Live Births  1           Past Medical History:  Diagnosis Date  . ADHD (attention deficit hyperactivity disorder)   . Anxiety   . Asthma   . Autism   . Bacterial vaginosis 11/23/2019  . Depression   . PTSD (post-traumatic stress disorder)   . Syncopal episodes     Past Surgical History:  Procedure Laterality Date  . CAUTERIZE INNER NOSE    . CERVICAL CERCLAGE N/A 04/14/2019   Procedure: CERCLAGE CERVICAL;  Surgeon: Noralee Space, MD;  Location: [facility];  Service: Gynecology;  Laterality: N/A;  . DENTAL SURGERY    . MOUTH SURGERY      Family History  Problem Relation Age of Onset  . Depression Mother   . Anxiety disorder Mother   . ADD / ADHD Mother   . Drug abuse Father   . Depression Sister   . Depression Maternal Grandmother   . Jaundice  Daughter     Social History   Tobacco Use  . Smoking status: Never Smoker  . Smokeless tobacco: Never Used  Vaping Use  . Vaping Use: Never used  Substance Use Topics  . Alcohol use: Not Currently  . Drug use: No    Allergies: No Known Allergies  Medications Prior to Admission  Medication Sig Dispense Refill Last Dose  . acetaminophen (TYLENOL) 500 MG tablet Take 500 mg by mouth every 6 (six) hours as needed for mild pain or headache. (Patient not taking: Reported on 11/20/2019)     . albuterol (VENTOLIN HFA) 108 (90 Base) MCG/ACT inhaler Inhale 2 puffs into the lungs every 6 (six) hours as needed for wheezing or shortness of breath.  (Patient not taking: Reported on 11/20/2019)     . lamoTRIgine (LAMICTAL) 100 MG tablet Take 1 tablet (100 mg total) by mouth 2 (two) times daily. 60 tablet 11   . lamoTRIgine (LAMICTAL) 25 MG tablet 1 tab bid x one week 2 tab bid x 2nd week 3 tab bid x 3rd week (Patient not taking: Reported on 11/20/2019) 84 tablet 0   .  levonorgestrel (LILETTA, 52 MG,) 19.5 MCG/DAY IUD IUD 1 each by Intrauterine route once.     . metroNIDAZOLE (FLAGYL) 500 MG tablet Take 1 tablet (500 mg total) by mouth 2 (two) times daily. 14 tablet 0   . prenatal vitamin w/FE, FA (PRENATAL 1 + 1) 27-1 MG TABS tablet Take 1 tablet by mouth daily at 12 noon. (Patient not taking: Reported on 11/20/2019) 30 tablet 12   . SUMAtriptan (IMITREX) 50 MG tablet Take 1 tablet (50 mg total) by mouth every 2 (two) hours as needed for migraine. May repeat in 2 hours if headache persists or recurs. (Patient not taking: Reported on 11/20/2019) 12 tablet 6     Review of Systems  Gastrointestinal: Positive for abdominal pain.  Genitourinary: Positive for vaginal bleeding.  All other systems reviewed and are negative.  Physical Exam   Blood pressure 128/71, pulse 89, temperature 98.2 F (36.8 C), temperature source Oral, resp. rate 16, SpO2 100 %, not currently breastfeeding.  Physical  Exam Nursing note reviewed. Exam conducted with a chaperone present.  Genitourinary:    Vagina: Normal.     Cervix: Normal.     Uterus: Normal.      Comments: Pelvic exam: External genitalia normal, vaginal walls pink and well rugated, cervix visually closed, no lesions noted. No abnormal discharge, no bleeding noted. IUD strings visible and appropriate length. IUD not protruding through os.     MAU Course  Procedures : speculum exam, string check  --EMR reviewed. Postplacental Liletta placed 06/01/2019. Reassuring string check 08/10/2019. IUD check 11/20/2019, IUD appropriately positioned during ED visit 11/24/2019  --Abnormal UA, significant squam epi noted, suggesting contamination. Patient may follow with urine dip in office PRN  Patient Vitals for the past 24 hrs:  BP Temp Temp src Pulse Resp SpO2  12/23/19 1602 113/76 -- -- 80 16 100 %  12/23/19 1537 128/71 98.2 F (36.8 C) Oral 89 16 100 %   Results for orders placed or performed during the hospital encounter of 12/23/19 (from the past 24 hour(s))  Urinalysis, Routine w reflex microscopic     Status: Abnormal   Collection Time: 12/23/19  3:05 PM  Result Value Ref Range   Color, Urine YELLOW YELLOW   APPearance HAZY (A) CLEAR   Specific Gravity, Urine 1.016 1.005 - 1.030   pH 8.0 5.0 - 8.0   Glucose, UA NEGATIVE NEGATIVE mg/dL   Hgb urine dipstick SMALL (A) NEGATIVE   Bilirubin Urine NEGATIVE NEGATIVE   Ketones, ur NEGATIVE NEGATIVE mg/dL   Protein, ur NEGATIVE NEGATIVE mg/dL   Nitrite NEGATIVE NEGATIVE   Leukocytes,Ua SMALL (A) NEGATIVE   RBC / HPF 0-5 0 - 5 RBC/hpf   WBC, UA 6-10 0 - 5 WBC/hpf   Bacteria, UA NONE SEEN NONE SEEN   Squamous Epithelial / LPF 21-50 0 - 5  Pregnancy, urine POC     Status: None   Collection Time: 12/23/19  3:09 PM  Result Value Ref Range   Preg Test, Ur NEGATIVE NEGATIVE  CBC     Status: None   Collection Time: 12/23/19  3:52 PM  Result Value Ref Range   WBC 9.7 4.0 - 10.5 K/uL    RBC 4.94 3.87 - 5.11 MIL/uL   Hemoglobin 13.0 12.0 - 15.0 g/dL   HCT 19.3 36 - 46 %   MCV 82.0 80.0 - 100.0 fL   MCH 26.3 26.0 - 34.0 pg   MCHC 32.1 30.0 - 36.0 g/dL   RDW 79.0 24.0 -  15.5 %   Platelets 320 150 - 400 K/uL   nRBC 0.0 0.0 - 0.2 %   Assessment and Plan  --19 y.o. G1P0101  --IUD in place --Not currently bleeding --Hgb 13.0 --Discharge home in stable condition  F/U: --Patient stable for outpatient f/u at [facility], CNM 12/23/2019, 6:06 PM

## 2020-01-01 ENCOUNTER — Encounter: Payer: Self-pay | Admitting: Neurology

## 2020-01-01 ENCOUNTER — Ambulatory Visit: Payer: Medicaid Other | Admitting: Neurology

## 2020-01-01 NOTE — Progress Notes (Deleted)
PATIENT: Audrey Matthews DOB: 2000/09/12  REASON FOR VISIT: follow up HISTORY FROM: patient  HISTORY OF PRESENT ILLNESS: Today 01/01/20  HISTORY  Audrey Matthews is a 19 year old female, seen in request by her primary care from practice, [facility] for evaluation of body shaking episode, initial evaluation was on September 27, 2019, we were also able to connected to her boyfriend via video call during conversation.  I reviewed and summarized the referring note. 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  Was treated with SSRI in the past, but no longer taking it.  She lives with her daughter of 31 months old with her boyfriend, and her mother.  She reported since she had her daughter, every time she has intercourse, she would pass out, it also happened occasionally while she was awake, she reported 1 episode 2 weeks ago she felt electric sensation running throughout her head, her body tensed up, whole body tremor without loss of consciousness, lasting for few minutes, she could not clearly recall what has happened,  Her boyfriend also reported over the past 2 weeks, sometimes she woke him up from sleep because her whole body tremor, tensed up, he has to pick her up in a standing position, then she would catch her breath, returned back to normal within couple minutes, there was no tongue biting, urinary incontinence.  Patient stated no spells happen on a nightly basis, can even have multiple times every night, she can sleep better in a recliner.  She wants to be seen by sleep specialist, because of her recurrent spells of waking up from sleep panicking, hungry for air, difficulty breathing  She denies lateralized motor or sensory deficit, she denies history of brain trauma, seizure, normal CBC, CMP, creatinine of 0.6, negative alcohol, hemoglobin of 12.2, UDS was negative on  September 26, 2019, UA was normal  Update January 01, 2020 SS:   REVIEW OF SYSTEMS: Out of a complete 14 system review of symptoms, the patient complains only of the following symptoms, and all other reviewed systems are negative.  ALLERGIES: No Known Allergies  HOME MEDICATIONS: Outpatient Medications Prior to Visit  Medication Sig Dispense Refill  . acetaminophen (TYLENOL) 500 MG tablet Take 500 mg by mouth every 6 (six) hours as needed for mild pain or headache. (Patient not taking: Reported on 11/20/2019)    . albuterol (VENTOLIN HFA) 108 (90 Base) MCG/ACT inhaler Inhale 2 puffs into the lungs every 6 (six) hours as needed for wheezing or shortness of breath.  (Patient not taking: Reported on 11/20/2019)    . lamoTRIgine (LAMICTAL) 100 MG tablet Take 1 tablet (100 mg total) by mouth 2 (two) times daily. 60 tablet 11  . lamoTRIgine (LAMICTAL) 25 MG tablet 1 tab bid x one week 2 tab bid x 2nd week 3 tab bid x 3rd week (Patient not taking: Reported on 11/20/2019) 84 tablet 0  . levonorgestrel (LILETTA, 52 MG,) 19.5 MCG/DAY IUD IUD 1 each by Intrauterine route once.    . metroNIDAZOLE (FLAGYL) 500 MG tablet Take 1 tablet (500 mg total) by mouth 2 (two) times daily. 14 tablet 0  . prenatal vitamin w/FE, FA (PRENATAL 1 + 1) 27-1 MG TABS tablet Take 1 tablet by mouth daily at 12 noon. (Patient not taking: Reported on 11/20/2019) 30 tablet 12  . SUMAtriptan (IMITREX) 50 MG tablet Take 1 tablet (50 mg total) by mouth every 2 (  two) hours as needed for migraine. May repeat in 2 hours if headache persists or recurs. (Patient not taking: Reported on 11/20/2019) 12 tablet 6   No facility-administered medications prior to visit.    PAST MEDICAL HISTORY: Past Medical History:  Diagnosis Date  . ADHD (attention deficit hyperactivity disorder)   . Anxiety   . Asthma   . Autism   . Bacterial vaginosis 11/23/2019  . Depression   . PTSD (post-traumatic stress disorder)   . Syncopal episodes     PAST  SURGICAL HISTORY: Past Surgical History:  Procedure Laterality Date  . CAUTERIZE INNER NOSE    . CERVICAL CERCLAGE N/A 04/14/2019   Procedure: CERCLAGE CERVICAL;  Surgeon: Noralee Space, MD;  Location: [facility];  Service: Gynecology;  Laterality: N/A;  . DENTAL SURGERY    . MOUTH SURGERY      FAMILY HISTORY: Family History  Problem Relation Age of Onset  . Depression Mother   . Anxiety disorder Mother   . ADD / ADHD Mother   . Drug abuse Father   . Depression Sister   . Depression Maternal Grandmother   . Jaundice Daughter     SOCIAL HISTORY: Social History   Socioeconomic History  . Marital status: Single    Spouse name: Not on file  . Number of children: 1  . Years of education: Not on file  . Highest education level: Not on file  Occupational History  . 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 Smoking status: Never Smoker  . Smokeless tobacco: Never Used  Vaping Use  . Vaping Use: Never used  Substance and Sexual Activity  . Alcohol use: Not Currently  . Drug use: No  . Sexual activity: Yes    Birth control/protection: I.U.D.  Other Topics Concern  . Not on file  Social History Narrative   Lives at home with mother during the week and boyfriend during the weekends.   She has one daughter - born March 2021 (three month premature).   Right-handed.   No daily caffeine use.   Social Determinants of Health   Financial Resource Strain: Low Risk   . Difficulty of Paying Living Expenses: Not hard at all  Food Insecurity: No Food Insecurity  . Worried About Programme researcher, broadcasting/film/video in the Last Year: Never true  . Ran Out of Food in the Last Year: Never true  Transportation Needs: No Transportation Needs  . Lack of Transportation (Medical): No  . Lack of Transportation (Non-Medical): No  Physical Activity: Insufficiently Active  . Days of Exercise per Week: 4 days  . Minutes of Exercise per Session: 10 min  Stress: Stress Concern Present  .  Feeling of Stress : Rather much  Social Connections: Moderately Isolated  . Frequency of Communication with Friends and Family: Three times a week  . Frequency of Social Gatherings with Friends and Family: Twice a week  . Attends Religious Services: Never  . Active Member of Clubs or Organizations: No  . Attends Banker Meetings: Never  . Marital Status: Living with partner  Intimate Partner Violence: Not At Risk  . Fear of Current or Ex-Partner: No  . Emotionally Abused: No  . Physically Abused: No  . Sexually Abused: No      PHYSICAL EXAM  There were no vitals filed for this visit. There is no height or weight on file to calculate BMI.  Generalized: Well developed, in no acute distress  Neurological examination  Mentation: Alert oriented to time, place, history taking. Follows all commands speech and language fluent Cranial nerve II-XII: Pupils were equal round reactive to light. Extraocular movements were full, visual field were full on confrontational test. Facial sensation and strength were normal. Uvula tongue midline. Head turning and shoulder shrug  were normal and symmetric. Motor: The motor testing reveals 5 over 5 strength of all 4 extremities. Good symmetric motor tone is noted throughout.  Sensory: Sensory testing is intact to soft touch on all 4 extremities. No evidence of extinction is noted.  Coordination: Cerebellar testing reveals good finger-nose-finger and heel-to-shin bilaterally.  Gait and station: Gait is normal. Tandem gait is normal. Romberg is negative. No drift is seen.  Reflexes: Deep tendon reflexes are symmetric and normal bilaterally.   DIAGNOSTIC DATA (LABS, IMAGING, TESTING) - I reviewed patient records, labs, notes, testing and imaging myself where available.  Lab Results  Component Value Date   WBC 9.7 12/23/2019   HGB 13.0 12/23/2019   HCT 40.5 12/23/2019   MCV 82.0 12/23/2019   PLT 320 12/23/2019      Component Value  Date/Time   NA 137 11/24/2019 0419   K 4.0 11/24/2019 0419   CL 105 11/24/2019 0419   CO2 22 11/24/2019 0419   GLUCOSE 89 11/24/2019 0419   BUN 9 11/24/2019 0419   CREATININE 0.60 11/24/2019 0419   CALCIUM 9.1 11/24/2019 0419   PROT 7.8 11/24/2019 0419   ALBUMIN 4.2 11/24/2019 0419   AST 19 11/24/2019 0419   ALT 22 11/24/2019 0419   ALKPHOS 54 11/24/2019 0419   BILITOT 0.3 11/24/2019 0419   GFRNONAA >60 11/24/2019 0419   GFRAA >60 11/24/2019 0419   Lab Results  Component Value Date   CHOL 191 (H) 10/04/2015   HDL 44 10/04/2015   LDLCALC 121 (H) 10/04/2015   TRIG 132 10/04/2015   CHOLHDL 4.3 10/04/2015   Lab Results  Component Value Date   HGBA1C 4.9 10/04/2015   No results found for: VITAMINB12 Lab Results  Component Value Date   TSH 3.050 09/27/2019      ASSESSMENT AND PLAN 19 y.o. year old female  has a past medical history of ADHD (attention deficit hyperactivity disorder), Anxiety, Asthma, Autism, Bacterial vaginosis (11/23/2019), Depression, PTSD (post-traumatic stress disorder), and Syncopal episodes. here with:  1.  Body shaking episode 2.  New onset waking up from sleep body shaking, difficulty breathing since June 2021 -Differential diagnosis mood disorder related versus seizure -MRI of the brain has yet to be completed -TSH unremarkable   I spent 15 minutes with the patient. 50% of this time was spent   Margie Ege, [place], DNP 01/01/2020, 5:38 AM [facility] [address], [address] [place], [place] [address] [phone]

## 2020-04-25 ENCOUNTER — Ambulatory Visit: Payer: Medicaid Other | Admitting: Advanced Practice Midwife

## 2020-05-13 ENCOUNTER — Ambulatory Visit: Payer: Medicaid Other | Admitting: Advanced Practice Midwife

## 2020-05-24 ENCOUNTER — Encounter: Payer: Self-pay | Admitting: Obstetrics & Gynecology

## 2020-05-24 ENCOUNTER — Other Ambulatory Visit: Payer: Self-pay

## 2020-05-24 ENCOUNTER — Ambulatory Visit (INDEPENDENT_AMBULATORY_CARE_PROVIDER_SITE_OTHER): Payer: Medicaid Other | Admitting: Obstetrics & Gynecology

## 2020-05-24 VITALS — BP 116/74 | HR 74 | Ht 61.0 in | Wt 192.0 lb

## 2020-05-24 DIAGNOSIS — Z30013 Encounter for initial prescription of injectable contraceptive: Secondary | ICD-10-CM

## 2020-05-24 DIAGNOSIS — Z30432 Encounter for removal of intrauterine contraceptive device: Secondary | ICD-10-CM | POA: Diagnosis not present

## 2020-05-24 MED ORDER — MEDROXYPROGESTERONE ACETATE 150 MG/ML IM SUSP
150.0000 mg | Freq: Once | INTRAMUSCULAR | Status: AC
  Administered 2020-05-24: 150 mg via INTRAMUSCULAR

## 2020-05-24 MED ORDER — MEDROXYPROGESTERONE ACETATE 150 MG/ML IM SUSP: 150.0000 mg | INTRAMUSCULAR | 3 refills | Status: DC

## 2020-05-24 NOTE — Progress Notes (Signed)
    Chief Complaint  Patient presents with  . IUD removal    Pain with intercourse-wants a different BC    Blood pressure 116/74, pulse 74, height 5\' 1"  (1.549 m), weight 192 lb (87.1 kg), not currently breastfeeding.  Pt has levonorgetrel Liletta IUD and wants it removed, she states it is causing her pain with interocurse.  Speuclum place, strings are visible and the IUD is removed inact without difficulty  Pt tolerated the procedure well  She desires Depo provera which is begun today  Meds ordered this encounter  Medications  . medroxyPROGESTERone (DEPO-PROVERA) 150 MG/ML injection    Sig: Inject 1 mL (150 mg total) into the muscle every 3 (three) months.    Dispense:  1 mL    Refill:  3    Follow up appt 3 months for next depo appt

## 2020-05-24 NOTE — Progress Notes (Signed)
Depo Provera 150mg  IM given in right deltoid with no complications.

## 2020-05-24 NOTE — Addendum Note (Signed)
Addended by: Moss Mc on: 05/24/2020 12:47 PM   Modules accepted: Orders

## 2020-06-03 ENCOUNTER — Ambulatory Visit: Payer: Medicaid Other | Admitting: Physical Therapy

## 2020-06-10 ENCOUNTER — Ambulatory Visit: Payer: Medicaid Other | Admitting: Physical Therapy

## 2020-06-17 ENCOUNTER — Ambulatory Visit: Payer: Medicaid Other | Attending: Family Medicine | Admitting: Physical Therapy

## 2020-06-17 ENCOUNTER — Other Ambulatory Visit: Payer: Self-pay

## 2020-06-17 ENCOUNTER — Encounter: Payer: Self-pay | Admitting: Physical Therapy

## 2020-06-17 DIAGNOSIS — R293 Abnormal posture: Secondary | ICD-10-CM | POA: Diagnosis present

## 2020-06-17 DIAGNOSIS — M6281 Muscle weakness (generalized): Secondary | ICD-10-CM

## 2020-06-17 DIAGNOSIS — G8929 Other chronic pain: Secondary | ICD-10-CM | POA: Diagnosis present

## 2020-06-17 DIAGNOSIS — M5442 Lumbago with sciatica, left side: Secondary | ICD-10-CM | POA: Insufficient documentation

## 2020-06-17 DIAGNOSIS — M5441 Lumbago with sciatica, right side: Secondary | ICD-10-CM | POA: Insufficient documentation

## 2020-06-17 NOTE — Therapy (Signed)
[name] [address] [place], [place], [address] Phone: [phone]   Fax:  [phone]  Physical Therapy Evaluation  Patient Details  Name: Audrey Matthews MRN: [id number] Date of Birth: [birth year] No data recorded  Encounter Date: 06/17/2020   PT End of Session - 06/17/20 1338    Visit Number 1    Number of Visits 12    Date for PT Re-Evaluation 08/05/20    Authorization Type Medicaid; progress note every 10th visit     PT Start Time 1116    PT Stop Time 1150    PT Time Calculation (min) 34 min    Activity Tolerance Patient limited by pain    Behavior During Therapy [facility] for tasks assessed/performed           Past Medical History:  Diagnosis Date  . ADHD (attention deficit hyperactivity disorder)   . Anxiety   . Asthma   . Autism   . Bacterial vaginosis 11/23/2019  . Depression   . PTSD (post-traumatic stress disorder)   . Syncopal episodes     Past Surgical History:  Procedure Laterality Date  . CAUTERIZE INNER NOSE    . CERVICAL CERCLAGE N/A 04/14/2019   Procedure: CERCLAGE CERVICAL;  Surgeon: Noralee SpaceShankar, Ravi, MD;  Location: [facility] LD [facility];  Service: Gynecology;  Laterality: N/A;  . DENTAL SURGERY    . MOUTH SURGERY      There were no vitals filed for this visit.    Subjective Assessment - 06/17/20 1329    Subjective COVID-19 screening performed upon arrival. Patient arrives to physical therapy with reports of chronic low back pain, neurological symptoms down bilateral LEs and up the spine, and history of falls and weakness that began after having an epidural about a year and a half ago. Patient reports pain limits her ability to stand, walk, and perform home activities and caregiving activities for young daughter. Patient reports neurological symptoms are frequent and run down to bilateral posterior knees. Patient reports knees give way while walking which causes frequent falls. Patient reports pain at worst as 10/10 and  pain at best as 3/10. Patient's goals are to decrease pain, improve movement, improve strength, improve standing, sitting, and walking tolerance, and have less difficulties with home activities and care giving activities.    Pertinent History Asthma, migraines, ADHD, major depressive disorder, Autism spectrum disorder, Boderline personality disorder, chronic PTSD    Limitations Sitting;Lifting;Standing;Walking;House hold activities    How long can you sit comfortably? "constant re-adjusting"    How long can you stand comfortably? "5 minutes"    How long can you walk comfortably? "depends, 5-15 minutes"    Diagnostic tests x-ray: unsure results    Patient Stated Goals have less pain to care for daughter    Currently in Pain? Yes    Pain Score 4     Pain Location Back    Pain Orientation Lower    Pain Descriptors / Indicators Throbbing;Sharp    Pain Type Chronic pain    Pain Radiating Towards bilateral LEs    Pain Onset More than a month ago    Pain Frequency Constant    Aggravating Factors  walking, bending, lifting, doing chores    Pain Relieving Factors laying down flat on back or stomach    Effect of Pain on Daily Activities "its hard to care for my daughter"              [facility] Assessment - 06/17/20 0001  Deep Tendon Reflexes   DTR Assessment Site Patella    Patella DTR 2+   bilaterally     ROM / Strength   AROM / PROM / Strength AROM;Strength      AROM   AROM Assessment Site Lumbar    Lumbar Flexion 10" finger tip to floor    Lumbar Extension 13 degrees    Lumbar - Right Side Bend 17.5" finger tip to floor    Lumbar - Left Side Bend 19" finger tip to floor   pain right lower lateral hip     Strength   Overall Strength Deficits;Due to pain    Strength Assessment Site Hip;Knee    Right/Left Hip Right;Left    Right Hip Flexion 4/5    Right Hip Extension 3+/5    Right Hip ABduction 3+/5    Left Hip Flexion 4/5    Left Hip Extension 3+/5    Left Hip ABduction  3+/5    Right/Left Knee Right;Left    Right Knee Flexion 4/5    Right Knee Extension 4+/5    Left Knee Flexion 4/5    Left Knee Extension 4+/5      Palpation   SI assessment  (+) scaral press test    Palpation comment tenderness to mid to low back through upper glute, less tenderness to palpation to bilateral hamstring      Special Tests    Special Tests Lumbar    Lumbar Tests FABER test;Straight Leg Raise;Slump Test      FABER test   findings Positive    Side Right    Comment bilaterally      Slump test   Findings Negative      Straight Leg Raise   Findings Positive    Side  Right    Comment bilaterally      Transfers   Five time sit to stand comments  23.9 seconds with no UE support                      Objective measurements completed on examination: See above findings.               PT Education - 06/17/20 1337    Education Details scapular retractions, draw ins, lower trunk rotations, bridging    Person(s) Educated Patient    Methods Explanation;Demonstration;Handout    Comprehension Verbalized understanding            PT Short Term Goals - 06/17/20 1513      PT SHORT TERM GOAL #1   Title Patient will be independent with initial HEP    Baseline no knowledge of exercises    Time 3    Period Weeks    Status New      PT SHORT TERM GOAL #2   Title Patient will report a 10% reduction of low back pain during ADLs and home activities.    Baseline 10/10 at worst    Time 3    Period Weeks    Status New             PT Long Term Goals - 06/17/20 1514      PT LONG TERM GOAL #1   Title Patient will be independent with advanced HEP    Baseline no knowledge of HEP    Time 6    Period Weeks    Status New      PT LONG TERM GOAL #2   Title Patient will demonstrate 4+/5  bilateral LE MMT to improve stability during functional tasks.    Baseline 3+/5    Time 6    Period Weeks    Status New      PT LONG TERM GOAL #3   Title  Patient will report ability to perform 4/10 low back pain to improve ability to perform ADLs and care giving tasks.    Baseline 10/10 at worst    Time 6    Period Weeks    Status New      PT LONG TERM GOAL #4   Title Patient will reprot ability to stand for 10 mins or greater to improve ability to perform home tasks.    Baseline less than 5 mins standing tolerance.    Time 6    Period Weeks    Status New                  Plan - 06/17/20 1341    Clinical Impression Statement Patinet is a 20 year old female who presents to physical therapy with low back pain, bilateral LE radiculopathy, decreased throacolumbar ROM, abnormal posture, and decreased bilateral LE MMT that began about one and a half years ago. Patient with increased lumbar lordosis and increased thoracic kyphosis. Patient very tender to palpation to thoracolumbar paraspinals, bilateral QLs, and upper glutes. Patient (-) with bilateral slump test, (+) L SLR test increasing pain and symptoms. Patient and PT discussed HEP and discussed plan of care to which patient reported understanding. Patient would benefit from skilled physical therapy to address deficits and address patient's goals.    Personal Factors and Comorbidities Age;Comorbidity 2;Time since onset of injury/illness/exacerbation    Comorbidities Asthma, migraines, ADHD, major depressive disorder, Autism spectrum disorder, Boderline personality disorder, chronic PTSD    Examination-Activity Limitations Locomotion Level;Transfers;Sit;Sleep;Squat;Stairs;Stand;Caring for Others    Examination-Participation Restrictions Cleaning;Meal Prep;Laundry    Stability/Clinical Decision Making Stable/Uncomplicated    Clinical Decision Making Low    Rehab Potential Fair    PT Frequency 2x / week    PT Duration 6 weeks    PT Treatment/Interventions ADLs/Self Care Home Management;Cryotherapy;Electrical Stimulation;Moist Heat;Ultrasound;Gait training;Stair training;Functional  mobility training;Therapeutic activities;Therapeutic exercise;Balance training;Neuromuscular re-education;Manual techniques;Passive range of motion;Patient/family education    PT Next Visit Plan nustep, LE strengthening and stretching, core stability, modalities PRN for pain relief.    PT Home Exercise Plan see patient education section    Consulted and Agree with Plan of Care Patient           Patient will benefit from skilled therapeutic intervention in order to improve the following deficits and impairments:  Abnormal gait,Difficulty walking,Decreased range of motion,Decreased activity tolerance,Decreased strength,Pain,Postural dysfunction  Visit Diagnosis: Chronic bilateral low back pain with bilateral sciatica - Plan: PT plan of care cert/re-cert  Muscle weakness (generalized) - Plan: PT plan of care cert/re-cert  Abnormal posture - Plan: PT plan of care cert/re-cert     Problem List Patient Active Problem List   Diagnosis Date Noted  . Alteration consciousness 09/27/2019  . Episode of shaking 09/27/2019  . Chronic migraine with aura 09/27/2019  . History of preterm delivery 07/06/2019  . H/O cervical incompetence 07/06/2019  . Encounter for IUD insertion 07/06/2019  . Asthma   . Borderline personality disorder (HCC) 06/03/2018  . Attention deficit hyperactivity disorder (ADHD) 10/04/2015  . MDD (major depressive disorder), recurrent episode, severe (HCC) 10/03/2015  . Autism spectrum disorder 12/25/2010  . Chronic post-traumatic stress disorder 12/25/2010    Guss Bunde, PT, DPT 06/17/2020, 8:36  PM  [name] [address] [place], [place], [address] Phone: [phone]   Fax:  [phone]  Name: Audrey Matthews MRN: [id number] Date of Birth: 2000-10-08

## 2020-07-01 ENCOUNTER — Telehealth: Payer: Self-pay | Admitting: Physical Therapy

## 2020-07-01 NOTE — Telephone Encounter (Signed)
S/w patient advised her we are waiting on her [employer] approval and then we will be glad to schedule her more appointments.

## 2020-07-08 ENCOUNTER — Ambulatory Visit: Payer: Medicaid Other | Attending: Family Medicine | Admitting: Physical Therapy

## 2020-07-08 ENCOUNTER — Encounter: Payer: Self-pay | Admitting: Physical Therapy

## 2020-07-08 ENCOUNTER — Other Ambulatory Visit: Payer: Self-pay

## 2020-07-08 DIAGNOSIS — R293 Abnormal posture: Secondary | ICD-10-CM | POA: Diagnosis present

## 2020-07-08 DIAGNOSIS — M5441 Lumbago with sciatica, right side: Secondary | ICD-10-CM | POA: Insufficient documentation

## 2020-07-08 DIAGNOSIS — M5442 Lumbago with sciatica, left side: Secondary | ICD-10-CM | POA: Insufficient documentation

## 2020-07-08 DIAGNOSIS — M6281 Muscle weakness (generalized): Secondary | ICD-10-CM | POA: Insufficient documentation

## 2020-07-08 DIAGNOSIS — G8929 Other chronic pain: Secondary | ICD-10-CM | POA: Diagnosis present

## 2020-07-08 NOTE — Therapy (Signed)
[name] [address] [place], [place], [address] Phone: [phone]   Fax:  [phone]  Physical Therapy Treatment  Patient Details  Name: Audrey Matthews MRN: [id number] Date of Birth: 02-Feb-2001 Referring Provider (PT): Almond Lint   Encounter Date: 07/08/2020   PT End of Session - 07/08/20 0754    Visit Number 2    Number of Visits 12    Date for PT Re-Evaluation 08/05/20    Authorization Type Medicaid; progress note every 10th visit     PT Start Time 0739    PT Stop Time 0815    PT Time Calculation (min) 36 min    Activity Tolerance Patient limited by pain    Behavior During Therapy [facility] for tasks assessed/performed           Past Medical History:  Diagnosis Date  . ADHD (attention deficit hyperactivity disorder)   . Anxiety   . Asthma   . Autism   . Bacterial vaginosis 11/23/2019  . Depression   . PTSD (post-traumatic stress disorder)   . Syncopal episodes     Past Surgical History:  Procedure Laterality Date  . CAUTERIZE INNER NOSE    . CERVICAL CERCLAGE N/A 04/14/2019   Procedure: CERCLAGE CERVICAL;  Surgeon: Noralee Space, MD;  Location: [facility] LD [facility]S;  Service: Gynecology;  Laterality: N/A;  . DENTAL SURGERY    . MOUTH SURGERY      There were no vitals filed for this visit.   Subjective Assessment - 07/08/20 0743    Subjective COVID-19 screening performed upon arrival. Reports recent episode of walking in her home and her RLE went completely numb. Reports "electricity from the bottom of my skull to my ankles" after cleaning up the livingroom with her husband.    Pertinent History Asthma, migraines, ADHD, major depressive disorder, Autism spectrum disorder, Boderline personality disorder, chronic PTSD    Limitations Sitting;Lifting;Standing;Walking;House hold activities    How long can you sit comfortably? "constant re-adjusting"    How long can you stand comfortably? "5 minutes"    How long can you walk  comfortably? "depends, 5-15 minutes"    Diagnostic tests x-ray: unsure results    Patient Stated Goals have less pain to care for daughter    Currently in Pain? Yes    Pain Score 2     Pain Location Back    Pain Orientation Lower    Pain Descriptors / Indicators Discomfort    Pain Type Chronic pain    Pain Onset More than a month ago    Pain Frequency Constant              [facility] PT Assessment - 07/08/20 0001      Assessment   Referring Provider (PT) Almond Lint    Next MD Visit TBD                         [facility] Adult PT Treatment/Exercise - 07/08/20 0001      Exercises   Exercises Lumbar      Lumbar Exercises: Stretches   Single Knee to Chest Stretch Right;Left;3 reps;30 seconds    Lower Trunk Rotation 5 reps;10 seconds    Hip Flexor Stretch Right;Left;2 reps;30 seconds    Figure 4 Stretch 3 reps;30 seconds;Supine;Without overpressure      Lumbar Exercises: Aerobic   Nustep L4 x10 min      Lumbar Exercises: Supine   Ab Set 10 reps;5 seconds  Glut Set 10 reps;5 seconds    Clam 10 reps;3 seconds    Bent Knee Raise 10 reps;3 seconds    Bridge Limitations   limited by pain                   PT Short Term Goals - 07/08/20 0859      PT SHORT TERM GOAL #1   Title Patient will be independent with initial HEP    Baseline no knowledge of exercises    Time 3    Period Weeks    Status On-going      PT SHORT TERM GOAL #2   Title Patient will report a 10% reduction of low back pain during ADLs and home activities.    Baseline 10/10 at worst    Time 3    Period Weeks    Status On-going             PT Long Term Goals - 07/08/20 0859      PT LONG TERM GOAL #1   Title Patient will be independent with advanced HEP    Baseline no knowledge of HEP    Time 6    Period Weeks    Status On-going      PT LONG TERM GOAL #2   Title Patient will demonstrate 4+/5 bilateral LE MMT to improve stability during functional tasks.    Baseline  3+/5    Time 6    Period Weeks    Status On-going      PT LONG TERM GOAL #3   Title Patient will report ability to perform 4/10 low back pain to improve ability to perform ADLs and care giving tasks.    Baseline 10/10 at worst    Time 6    Period Weeks    Status On-going      PT LONG TERM GOAL #4   Title Patient will reprot ability to stand for 10 mins or greater to improve ability to perform home tasks.    Baseline less than 5 mins standing tolerance.    Time 6    Period Weeks    Status On-going                 Plan - 07/08/20 0849    Clinical Impression Statement Patient presented in clinic with reports of low level lumbar pain and episodes of "electricity" feeling down her spine. Patient guided through low level lumbar stretching and mobility exercises with only complaints of pain after Nustep warmup and bridging. Patient limited to only one rep of bridge due to pain. Patient educated of plan regarding stretching, HEP, core strengthening. Patient reported pain immediately upon standing in central low back.    Personal Factors and Comorbidities Age;Comorbidity 2;Time since onset of injury/illness/exacerbation    Comorbidities Asthma, migraines, ADHD, major depressive disorder, Autism spectrum disorder, Boderline personality disorder, chronic PTSD    Examination-Activity Limitations Locomotion Level;Transfers;Sit;Sleep;Squat;Stairs;Stand;Caring for Others    Examination-Participation Restrictions Cleaning;Meal Prep;Laundry    Stability/Clinical Decision Making Stable/Uncomplicated    Rehab Potential Fair    PT Frequency 2x / week    PT Duration 6 weeks    PT Treatment/Interventions ADLs/Self Care Home Management;Cryotherapy;Electrical Stimulation;Moist Heat;Ultrasound;Gait training;Stair training;Functional mobility training;Therapeutic activities;Therapeutic exercise;Balance training;Neuromuscular re-education;Manual techniques;Passive range of motion;Patient/family education     PT Next Visit Plan nustep, LE strengthening and stretching, core stability, modalities PRN for pain relief.    PT Home Exercise Plan see patient education section    Consulted and Agree  with Plan of Care Patient           Patient will benefit from skilled therapeutic intervention in order to improve the following deficits and impairments:  Abnormal gait,Difficulty walking,Decreased range of motion,Decreased activity tolerance,Decreased strength,Pain,Postural dysfunction  Visit Diagnosis: Chronic bilateral low back pain with bilateral sciatica  Muscle weakness (generalized)  Abnormal posture     Problem List Patient Active Problem List   Diagnosis Date Noted  . Alteration consciousness 09/27/2019  . Episode of shaking 09/27/2019  . Chronic migraine with aura 09/27/2019  . History of preterm delivery 07/06/2019  . H/O cervical incompetence 07/06/2019  . Encounter for IUD insertion 07/06/2019  . Asthma   . Borderline personality disorder (HCC) 06/03/2018  . Attention deficit hyperactivity disorder (ADHD) 10/04/2015  . MDD (major depressive disorder), recurrent episode, severe (HCC) 10/03/2015  . Autism spectrum disorder 12/25/2010  . Chronic post-traumatic stress disorder 12/25/2010    Marvell Fuller, PTA 07/08/2020, 8:59 AM  [facility] [address] [place], [place], [address] Phone: [phone]   Fax:  [phone]  Name: Audrey Matthews MRN: [id number] Date of Birth: 02/05/2001

## 2020-07-10 ENCOUNTER — Ambulatory Visit: Payer: Medicaid Other | Admitting: *Deleted

## 2020-07-10 ENCOUNTER — Other Ambulatory Visit: Payer: Self-pay

## 2020-07-10 DIAGNOSIS — G8929 Other chronic pain: Secondary | ICD-10-CM

## 2020-07-10 DIAGNOSIS — M6281 Muscle weakness (generalized): Secondary | ICD-10-CM

## 2020-07-10 DIAGNOSIS — R293 Abnormal posture: Secondary | ICD-10-CM

## 2020-07-10 DIAGNOSIS — M5442 Lumbago with sciatica, left side: Secondary | ICD-10-CM | POA: Diagnosis not present

## 2020-07-10 NOTE — Therapy (Signed)
[name] [address] [place], [place], [address] Phone: [phone]   Fax:  [phone]  Physical Therapy Treatment  Patient Details  Name: Audrey Matthews MRN: [id number] Date of Birth: Jun 07, 2000 Referring Provider (PT): Almond Lint   Encounter Date: 07/10/2020   PT End of Session - 07/10/20 1029    Visit Number 3    Number of Visits 12    Date for PT Re-Evaluation 08/05/20    PT Start Time 0900    PT Stop Time 0947    PT Time Calculation (min) 47 min           Past Medical History:  Diagnosis Date  . ADHD (attention deficit hyperactivity disorder)   . Anxiety   . Asthma   . Autism   . Bacterial vaginosis 11/23/2019  . Depression   . PTSD (post-traumatic stress disorder)   . Syncopal episodes     Past Surgical History:  Procedure Laterality Date  . CAUTERIZE INNER NOSE    . CERVICAL CERCLAGE N/A 04/14/2019   Procedure: CERCLAGE CERVICAL;  Surgeon: Noralee Space, MD;  Location: [facility] LD [facility]S;  Service: Gynecology;  Laterality: N/A;  . DENTAL SURGERY    . MOUTH SURGERY      There were no vitals filed for this visit.   Subjective Assessment - 07/10/20 0907    Subjective COVID-19 screening performed upon arrival. 5/10 LB    Pertinent History Asthma, migraines, ADHD, major depressive disorder, Autism spectrum disorder, Boderline personality disorder, chronic PTSD    Limitations Sitting;Lifting;Standing;Walking;House hold activities    How long can you sit comfortably? "constant re-adjusting"    How long can you stand comfortably? "5 minutes"    How long can you walk comfortably? "depends, 5-15 minutes"    Diagnostic tests x-ray: unsure results    Patient Stated Goals have less pain to care for daughter    Currently in Pain? Yes    Pain Score 5     Pain Location Back    Pain Orientation Lower    Pain Descriptors / Indicators Discomfort    Pain Type Chronic pain    Pain Onset More than a month ago                              [facility] Adult PT Treatment/Exercise - 07/10/20 0001      Therapeutic Activites    Therapeutic Activities ADL's    ADL's chair squat x 10  and ab brace with log roll on/off mat      Exercises   Exercises Lumbar      Lumbar Exercises: Aerobic   Tread Mill 1.5 MPH x 10 mins      Lumbar Exercises: Standing   Other Standing Lumbar Exercises standing modified bird dog at counter arm raise x10 and leg raise x 10 2sets each      Lumbar Exercises: Supine   Ab Set 5 reps;5 seconds    Bent Knee Raise 5 seconds;20 reps   with AB bracing   Other Supine Lumbar Exercises curl up x 10 hold 5 secs both knees flexed      Lumbar Exercises: Quadruped   [place]/Old Horse 10 reps    Single Arm Raise --   attempted, but had wrist pain                   PT Short Term Goals - 07/08/20 0859      PT  SHORT TERM GOAL #1   Title Patient will be independent with initial HEP    Baseline no knowledge of exercises    Time 3    Period Weeks    Status On-going      PT SHORT TERM GOAL #2   Title Patient will report a 10% reduction of low back pain during ADLs and home activities.    Baseline 10/10 at worst    Time 3    Period Weeks    Status On-going             PT Long Term Goals - 07/08/20 0859      PT LONG TERM GOAL #1   Title Patient will be independent with advanced HEP    Baseline no knowledge of HEP    Time 6    Period Weeks    Status On-going      PT LONG TERM GOAL #2   Title Patient will demonstrate 4+/5 bilateral LE MMT to improve stability during functional tasks.    Baseline 3+/5    Time 6    Period Weeks    Status On-going      PT LONG TERM GOAL #3   Title Patient will report ability to perform 4/10 low back pain to improve ability to perform ADLs and care giving tasks.    Baseline 10/10 at worst    Time 6    Period Weeks    Status On-going      PT LONG TERM GOAL #4   Title Patient will reprot ability to stand for 10 mins  or greater to improve ability to perform home tasks.    Baseline less than 5 mins standing tolerance.    Time 6    Period Weeks    Status On-going                 Plan - 07/10/20 1145    Clinical Impression Statement Pt arrived today doing fair , but with moderate LBP. Rx focused on core activation exs with neutral lumbar spine and body mechanics for chair squat, power lift, and  log roll for in/out of bed.Modified some core exs due to pain LT side SIJ/LB. Pt needs  review for core exs and body mechanics    Personal Factors and Comorbidities Age;Comorbidity 2;Time since onset of injury/illness/exacerbation    Comorbidities Asthma, migraines, ADHD, major depressive disorder, Autism spectrum disorder, Boderline personality disorder, chronic PTSD    Examination-Activity Limitations Locomotion Level;Transfers;Sit;Sleep;Squat;Stairs;Stand;Caring for Others    Examination-Participation Restrictions Cleaning;Meal Prep;Laundry    Stability/Clinical Decision Making Stable/Uncomplicated    Rehab Potential Fair    PT Frequency 2x / week    PT Treatment/Interventions ADLs/Self Care Home Management;Cryotherapy;Electrical Stimulation;Moist Heat;Ultrasound;Gait training;Stair training;Functional mobility training;Therapeutic activities;Therapeutic exercise;Balance training;Neuromuscular re-education;Manual techniques;Passive range of motion;Patient/family education    PT Next Visit Plan nustep, LE strengthening and stretching, core stability, modalities PRN for pain relief.    PT Home Exercise Plan see patient education section           Patient will benefit from skilled therapeutic intervention in order to improve the following deficits and impairments:  Abnormal gait,Difficulty walking,Decreased range of motion,Decreased activity tolerance,Decreased strength,Pain,Postural dysfunction  Visit Diagnosis: Chronic bilateral low back pain with bilateral sciatica  Muscle weakness  (generalized)  Abnormal posture     Problem List Patient Active Problem List   Diagnosis Date Noted  . Alteration consciousness 09/27/2019  . Episode of shaking 09/27/2019  . Chronic migraine with aura 09/27/2019  .  History of preterm delivery 07/06/2019  . H/O cervical incompetence 07/06/2019  . Encounter for IUD insertion 07/06/2019  . Asthma   . Borderline personality disorder (HCC) 06/03/2018  . Attention deficit hyperactivity disorder (ADHD) 10/04/2015  . MDD (major depressive disorder), recurrent episode, severe (HCC) 10/03/2015  . Autism spectrum disorder 12/25/2010  . Chronic post-traumatic stress disorder 12/25/2010    ,CHRIS, PTA 07/10/2020, 12:09 PM  [facility] [address] [place], [place], [address] Phone: [phone]   Fax:  [phone]  Name: Drianna Chandran MRN: [id number] Date of Birth: Dec 29, 2000

## 2020-07-15 ENCOUNTER — Ambulatory Visit: Payer: Medicaid Other | Admitting: Physical Therapy

## 2020-07-17 ENCOUNTER — Encounter: Payer: Self-pay | Admitting: Physical Therapy

## 2020-07-17 ENCOUNTER — Other Ambulatory Visit: Payer: Self-pay

## 2020-07-17 ENCOUNTER — Ambulatory Visit: Payer: Medicaid Other | Admitting: Physical Therapy

## 2020-07-17 DIAGNOSIS — G8929 Other chronic pain: Secondary | ICD-10-CM

## 2020-07-17 DIAGNOSIS — M6281 Muscle weakness (generalized): Secondary | ICD-10-CM

## 2020-07-17 DIAGNOSIS — M5442 Lumbago with sciatica, left side: Secondary | ICD-10-CM | POA: Diagnosis not present

## 2020-07-17 DIAGNOSIS — R293 Abnormal posture: Secondary | ICD-10-CM

## 2020-07-17 NOTE — Therapy (Signed)
[name] [address] [place], [place], [address] Phone: [phone]   Fax:  [phone]  Physical Therapy Treatment  Patient Details  Name: Analayah Brooke MRN: [id number] Date of Birth: [birth year] Referring Provider (PT): Almond Lint   Encounter Date: 07/17/2020   PT End of Session - 07/17/20 1429    Visit Number 4    Number of Visits 12    Date for PT Re-Evaluation 08/05/20    Authorization Type Medicaid; progress note every 10th visit    PT Start Time 1427    PT Stop Time 1510    PT Time Calculation (min) 43 min    Activity Tolerance Patient tolerated treatment well    Behavior During Therapy [facility] for tasks assessed/performed           Past Medical History:  Diagnosis Date  . ADHD (attention deficit hyperactivity disorder)   . Anxiety   . Asthma   . Autism   . Bacterial vaginosis 11/23/2019  . Depression   . PTSD (post-traumatic stress disorder)   . Syncopal episodes     Past Surgical History:  Procedure Laterality Date  . CAUTERIZE INNER NOSE    . CERVICAL CERCLAGE N/A 04/14/2019   Procedure: CERCLAGE CERVICAL;  Surgeon: Noralee Space, MD;  Location: [facility] LD ORS;  Service: Gynecology;  Laterality: N/A;  . DENTAL SURGERY    . MOUTH SURGERY      There were no vitals filed for this visit.   Subjective Assessment - 07/17/20 1428    Subjective COVID-19 screening performed upon arrival. Reports no LBP or spasms but passing a kidney stone.    Pertinent History Asthma, migraines, ADHD, major depressive disorder, Autism spectrum disorder, Boderline personality disorder, chronic PTSD    Limitations Sitting;Lifting;Standing;Walking;House hold activities    How long can you sit comfortably? "constant re-adjusting"    How long can you stand comfortably? "5 minutes"    How long can you walk comfortably? "depends, 5-15 minutes"    Diagnostic tests x-ray: unsure results    Patient Stated Goals have less pain to care for  daughter    Currently in Pain? Other (Comment)   No pain assessment provided             [facility] PT Assessment - 07/17/20 0001      Assessment   Referring Provider (PT) Almond Lint    Next MD Visit TBD                         [facility] Adult PT Treatment/Exercise - 07/17/20 0001      Exercises   Exercises Lumbar;Knee/Hip      Lumbar Exercises: Stretches   Active Hamstring Stretch Right;3 reps;30 seconds    Single Knee to Chest Stretch Right;Left;3 reps;30 seconds    Figure 4 Stretch 3 reps;30 seconds;Supine;Without overpressure    Figure 4 Stretch Limitations BLE      Lumbar Exercises: Standing   Functional Squats 15 reps   for education regarding picking up objects, daughter   Wall Slides 15 reps;3 seconds    Shoulder Extension Strengthening;Both;15 reps;Limitations    Shoulder Extension Limitations Green XTS    Other Standing Lumbar Exercises Standing modified cat/camel x10 reps    Other Standing Lumbar Exercises Standing modifed arm raise x20 reps, leg raise x20 reps      Knee/Hip Exercises: Standing   Heel Raises Both;20 reps    Heel Raises Limitations with glute squeeze    Hip  Abduction AROM;Both;20 reps;Knee straight   greater burning in L hip; core bracing                   PT Short Term Goals - 07/08/20 0859      PT SHORT TERM GOAL #1   Title Patient will be independent with initial HEP    Baseline no knowledge of exercises    Time 3    Period Weeks    Status On-going      PT SHORT TERM GOAL #2   Title Patient will report a 10% reduction of low back pain during ADLs and home activities.    Baseline 10/10 at worst    Time 3    Period Weeks    Status On-going             PT Long Term Goals - 07/08/20 0859      PT LONG TERM GOAL #1   Title Patient will be independent with advanced HEP    Baseline no knowledge of HEP    Time 6    Period Weeks    Status On-going      PT LONG TERM GOAL #2   Title Patient will demonstrate  4+/5 bilateral LE MMT to improve stability during functional tasks.    Baseline 3+/5    Time 6    Period Weeks    Status On-going      PT LONG TERM GOAL #3   Title Patient will report ability to perform 4/10 low back pain to improve ability to perform ADLs and care giving tasks.    Baseline 10/10 at worst    Time 6    Period Weeks    Status On-going      PT LONG TERM GOAL #4   Title Patient will reprot ability to stand for 10 mins or greater to improve ability to perform home tasks.    Baseline less than 5 mins standing tolerance.    Time 6    Period Weeks    Status On-going                 Plan - 07/17/20 1522    Clinical Impression Statement Patient presented in clinic with reports of less LBP and spasms. Patient reports passing one kidney stone but was told she had multiple present. Patient guided through various strengthening exercises with focus on core bracing and stability. Patient reported greater L hip burning with hip abduction and normally has greater L LBP. Patient required more instruction for modified bird/dog exercises. Session was ended with stretching due to some reports of LBP. Patient encouraged to continue stretching especially prior to bed. Instruction also provided for proper lifting technique of objects as well as her daughter.    Personal Factors and Comorbidities Age;Comorbidity 2;Time since onset of injury/illness/exacerbation    Comorbidities Asthma, migraines, ADHD, major depressive disorder, Autism spectrum disorder, Boderline personality disorder, chronic PTSD    Examination-Activity Limitations Locomotion Level;Transfers;Sit;Sleep;Squat;Stairs;Stand;Caring for Others    Examination-Participation Restrictions Cleaning;Meal Prep;Laundry    Stability/Clinical Decision Making Stable/Uncomplicated    Rehab Potential Fair    PT Frequency 2x / week    PT Duration 6 weeks    PT Treatment/Interventions ADLs/Self Care Home Management;Cryotherapy;Electrical  Stimulation;Moist Heat;Ultrasound;Gait training;Stair training;Functional mobility training;Therapeutic activities;Therapeutic exercise;Balance training;Neuromuscular re-education;Manual techniques;Passive range of motion;Patient/family education    PT Next Visit Plan nustep, LE strengthening and stretching, core stability, modalities PRN for pain relief.    PT Home Exercise Plan see patient education  section    Consulted and Agree with Plan of Care Patient           Patient will benefit from skilled therapeutic intervention in order to improve the following deficits and impairments:  Abnormal gait,Difficulty walking,Decreased range of motion,Decreased activity tolerance,Decreased strength,Pain,Postural dysfunction  Visit Diagnosis: Chronic bilateral low back pain with bilateral sciatica  Muscle weakness (generalized)  Abnormal posture     Problem List Patient Active Problem List   Diagnosis Date Noted  . Alteration consciousness 09/27/2019  . Episode of shaking 09/27/2019  . Chronic migraine with aura 09/27/2019  . History of preterm delivery 07/06/2019  . H/O cervical incompetence 07/06/2019  . Encounter for IUD insertion 07/06/2019  . Asthma   . Borderline personality disorder (HCC) 06/03/2018  . Attention deficit hyperactivity disorder (ADHD) 10/04/2015  . MDD (major depressive disorder), recurrent episode, severe (HCC) 10/03/2015  . Autism spectrum disorder 12/25/2010  . Chronic post-traumatic stress disorder 12/25/2010    Marvell Fuller, PTA 07/17/2020, 3:27 PM  [name] [address] [place], [place], [address] Phone: [phone]   Fax:  [phone]  Name: Francena Zender MRN: [id number] Date of Birth: [birth year]

## 2020-07-22 ENCOUNTER — Ambulatory Visit: Payer: Medicaid Other | Attending: Family Medicine | Admitting: Physical Therapy

## 2020-08-16 ENCOUNTER — Ambulatory Visit (INDEPENDENT_AMBULATORY_CARE_PROVIDER_SITE_OTHER): Payer: Medicaid Other | Admitting: Adult Health

## 2020-08-16 ENCOUNTER — Encounter: Payer: Self-pay | Admitting: Adult Health

## 2020-08-16 ENCOUNTER — Other Ambulatory Visit: Payer: Self-pay

## 2020-08-16 VITALS — BP 123/78 | HR 87 | Ht 61.0 in | Wt 194.5 lb

## 2020-08-16 DIAGNOSIS — Z308 Encounter for other contraceptive management: Secondary | ICD-10-CM | POA: Insufficient documentation

## 2020-08-16 DIAGNOSIS — Z8751 Personal history of pre-term labor: Secondary | ICD-10-CM

## 2020-08-16 DIAGNOSIS — Z3202 Encounter for pregnancy test, result negative: Secondary | ICD-10-CM | POA: Diagnosis not present

## 2020-08-16 LAB — POCT URINE PREGNANCY: Preg Test, Ur: NEGATIVE

## 2020-08-16 MED ORDER — MEDROXYPROGESTERONE ACETATE 150 MG/ML IM SUSP
150.0000 mg | Freq: Once | INTRAMUSCULAR | Status: AC
  Administered 2020-08-16: 150 mg via INTRAMUSCULAR

## 2020-08-16 NOTE — Progress Notes (Signed)
  Subjective:     Patient ID: Audrey Matthews, female   DOB: 2000/10/09, 20 y.o.   MRN: [id number]  HPI  Audrey Matthews is a 20 year old white female,single, G1P0101, in for depo and has questions about future pregnancies,she had preterm delivery with rescue cerclage. PCP is [facility].  Review of Systems No periods on depo Has had back pain since spinal with cerclage, goes to PT Reviewed past medical,surgical, social and family history. Reviewed medications and allergies.     Objective:   Physical Exam BP 123/78 (BP Location: Left Arm, Patient Position: Sitting, Cuff Size: Normal)   Pulse 87   Ht 5\' 1"  (1.549 m)   Wt 194 lb 8 oz (88.2 kg)   Breastfeeding No   BMI 36.75 kg/m UPT is negative. Skin warm and dry. Neck: mid line trachea, normal thyroid, good ROM, no lymphadenopathy noted. Lungs: clear to ausculation bilaterally. Cardiovascular: regular rate and rhythm. Fall risk is low  Upstream - 08/16/20 1043      Pregnancy Intention Screening   Does the patient want to become pregnant in the next year? Ok Either Way    Does the patient's partner want to become pregnant in the next year? Ok Either Way    Would the patient like to discuss contraceptive options today? No      Contraception Wrap Up   Current Method Hormonal Injection    End Method Hormonal Injection    Contraception Counseling Provided No             Assessment:     1. Pregnancy examination or test, negative result   2. Encounter for other contraceptive management Depo given today Has refills on depo  3. History of preterm delivery Discussed that would need cerclage at [redacted] weeks gestation Would probably get 17P, to try to prevent preterm labor    Plan:     Return in 12 weeks for depo and pap and physical

## 2020-11-05 ENCOUNTER — Other Ambulatory Visit: Payer: Self-pay

## 2020-11-05 ENCOUNTER — Other Ambulatory Visit (HOSPITAL_COMMUNITY)
Admission: RE | Admit: 2020-11-05 | Discharge: 2020-11-05 | Disposition: A | Discharge status: Home / Self Care | Payer: Medicaid Other | Source: Ambulatory Visit | Attending: Adult Health | Admitting: Adult Health

## 2020-11-05 ENCOUNTER — Encounter: Payer: Self-pay | Admitting: Adult Health

## 2020-11-05 ENCOUNTER — Ambulatory Visit (INDEPENDENT_AMBULATORY_CARE_PROVIDER_SITE_OTHER): Payer: Medicaid Other | Admitting: Adult Health

## 2020-11-05 VITALS — BP 114/79 | HR 65 | Ht 61.75 in | Wt 191.0 lb

## 2020-11-05 DIAGNOSIS — Z3042 Encounter for surveillance of injectable contraceptive: Secondary | ICD-10-CM | POA: Diagnosis not present

## 2020-11-05 DIAGNOSIS — Z0000 Encounter for general adult medical examination without abnormal findings: Secondary | ICD-10-CM | POA: Insufficient documentation

## 2020-11-05 DIAGNOSIS — Z01419 Encounter for gynecological examination (general) (routine) without abnormal findings: Secondary | ICD-10-CM | POA: Insufficient documentation

## 2020-11-05 DIAGNOSIS — Z308 Encounter for other contraceptive management: Secondary | ICD-10-CM

## 2020-11-05 MED ORDER — MEDROXYPROGESTERONE ACETATE 150 MG/ML IM SUSP
150.0000 mg | Freq: Once | INTRAMUSCULAR | Status: AC
  Administered 2020-11-05: 150 mg via INTRAMUSCULAR

## 2020-11-05 NOTE — Progress Notes (Addendum)
Patient ID: Audrey Matthews, female   DOB: 12/07/2000, 20 y.o.   MRN: [id number] History of Present Illness: Farrin is a 20 year old white female,single, G1P0101, in for well woman gyn exam and pap. (First pap).Depo due today. Having some cramping and low abdominal pain. PCP is [facility].   Current Medications, Allergies, Past Medical History, Past Surgical History, Family History and Social History were reviewed in [employer] record.     Review of Systems: Patient denies any headaches, hearing loss, fatigue, blurred vision, shortness of breath, chest pain,  problems with bowel movements, urination, or intercourse. No joint pain or mood swings.  See HPI for positives.   Physical Exam:BP 114/79 (BP Location: Left Arm, Patient Position: Sitting, Cuff Size: Large)   Pulse 65   Ht 5' 1.75" (1.568 m)   Wt 191 lb (86.6 kg)   Breastfeeding No   BMI 35.22 kg/m   General:  Well developed, well nourished, no acute distress Skin:  Warm and dry Neck:  Midline trachea, normal thyroid, good ROM, no lymphadenopathy Lungs; Clear to auscultation bilaterally Breast:  No dominant palpable mass, retraction, or nipple discharge Cardiovascular: Regular rate and rhythm Abdomen:  Soft, non tender, no hepatosplenomegaly Pelvic:  External genitalia is normal in appearance, no lesions.  The vagina is normal in appearance. Urethra has no lesions or masses. The cervix is bulbous,pap with GC/CHL performed.  Uterus is felt to be normal size, shape, and contour.  No adnexal masses or tenderness noted.Bladder is non tender, no masses felt. Rectal: Deferred Extremities/musculoskeletal:  No swelling or varicosities noted, no clubbing or cyanosis Psych:  No mood changes, alert and cooperative,seems happy AA is 1 Fall risk is low Depression screen [facility] 2/9 11/05/2020 02/09/2019 12/27/2018  Decreased Interest 1 0 1  Down, Depressed, Hopeless 1 0 1  PHQ - 2 Score 2 0 2  Altered sleeping 1 1 0   Tired, decreased energy 1 1 1   Change in appetite 0 0 2  Feeling bad or failure about yourself  1 0 1  Trouble concentrating 1 2 0  Moving slowly or fidgety/restless 0 3 1  Suicidal thoughts 0 0 0  PHQ-9 Score 6 7 7   Difficult doing work/chores - Somewhat difficult -   On Zoloft and Abilify with PCP GAD 7 : Generalized Anxiety Score 11/05/2020  Nervous, Anxious, on Edge 1  Control/stop worrying 1  Worry too much - different things 1  Trouble relaxing 1  Restless 1  Easily annoyed or irritable 2  Afraid - awful might happen 1  Total GAD 7 Score 8      Upstream - 11/05/20 1341       Pregnancy Intention Screening   Does the patient want to become pregnant in the next year? Unsure    Does the patient's partner want to become pregnant in the next year? Unsure    Would the patient like to discuss contraceptive options today? No      Contraception Wrap Up   Current Method Hormonal Injection    End Method Hormonal Injection            Examination chaperoned by 11/07/2020 LPN   Impression and Plan: 1. Routine general medical examination at a health care facility Pap sent  2. Encounter for other contraceptive management Depo 15o mg IM right deltoid today by 11/07/20, LPN  3. Encounter for gynecological examination with Papanicolaou smear of cervix Pap sent Physical in 1 year Pap in 3 if normal  Discussed that cramping should be better after depo today, if continues let me know   4. Encounter for surveillance of injectable contraceptive Has refills on Depo Depo in 12 weeks

## 2020-11-07 LAB — CYTOLOGY - PAP
Chlamydia: NEGATIVE
Comment: NEGATIVE
Comment: NORMAL
Diagnosis: NEGATIVE
Neisseria Gonorrhea: NEGATIVE

## 2021-01-28 ENCOUNTER — Ambulatory Visit (INDEPENDENT_AMBULATORY_CARE_PROVIDER_SITE_OTHER): Payer: Medicaid Other | Admitting: *Deleted

## 2021-01-28 ENCOUNTER — Other Ambulatory Visit: Payer: Self-pay

## 2021-01-28 DIAGNOSIS — Z3042 Encounter for surveillance of injectable contraceptive: Secondary | ICD-10-CM | POA: Diagnosis not present

## 2021-01-28 MED ORDER — MEDROXYPROGESTERONE ACETATE 150 MG/ML IM SUSP
150.0000 mg | Freq: Once | INTRAMUSCULAR | Status: AC
  Administered 2021-01-28: 150 mg via INTRAMUSCULAR

## 2021-01-28 NOTE — Progress Notes (Signed)
   NURSE VISIT- INJECTION  SUBJECTIVE:  Audrey Matthews is a 20 y.o. G34P0101 female here for a Depo Provera for contraception/period management. She is a GYN patient.   OBJECTIVE:  There were no vitals taken for this visit.  Appears well, in no apparent distress  Injection administered in: Left deltoid  Meds ordered this encounter  Medications   medroxyPROGESTERone (DEPO-PROVERA) injection 150 mg    ASSESSMENT: GYN patient Depo Provera for contraception/period management PLAN: Follow-up: in 11-13 weeks for next Depo   Annamarie Dawley  01/28/2021 9:47 AM

## 2021-04-22 ENCOUNTER — Ambulatory Visit (INDEPENDENT_AMBULATORY_CARE_PROVIDER_SITE_OTHER): Payer: Medicaid Other

## 2021-04-22 ENCOUNTER — Other Ambulatory Visit: Payer: Self-pay

## 2021-04-22 DIAGNOSIS — Z3042 Encounter for surveillance of injectable contraceptive: Secondary | ICD-10-CM | POA: Diagnosis not present

## 2021-04-22 MED ORDER — MEDROXYPROGESTERONE ACETATE 150 MG/ML IM SUSY: PREFILLED_SYRINGE | Freq: Once | INTRAMUSCULAR | Status: AC

## 2021-04-22 NOTE — Progress Notes (Signed)
° °  NURSE VISIT- INJECTION  SUBJECTIVE:  Audrey Matthews is a 21 y.o. G52P0101 female here for a Depo Provera for contraception/period management. She is a GYN patient.   OBJECTIVE:  There were no vitals taken for this visit.  Appears well, in no apparent distress  Injection administered in: Right deltoid  Meds ordered this encounter  Medications   medroxyPROGESTERone Acetate SUSY    ASSESSMENT: GYN patient Depo Provera for contraception/period management PLAN: Follow-up: in 11-13 weeks for next Depo    A   04/22/2021 2:39 PM

## 2021-05-18 IMAGING — CT CT ABD-PELV W/ CM
2 of 4 series · 17 of 46 positions shown, 19 images · IV contrast (Omnipaque or Isovue)
Comparison: None.

CLINICAL DATA: Right lower quadrant abdominal pain

EXAM:
CT ABDOMEN AND PELVIS WITH CONTRAST
TECHNIQUE: Multidetector CT imaging of the abdomen and pelvis was performed
using the standard protocol following bolus administration of
intravenous contrast.
CONTRAST:  100mL OMNIPAQUE IOHEXOL 300 MG/ML  SOLN

[Series 2: axial st · axial · 0.74mm/px · z∈[+1122,+1562]mm · 14 of 96 slices shown, 16 images]
[im 4/96  soft-tissue]
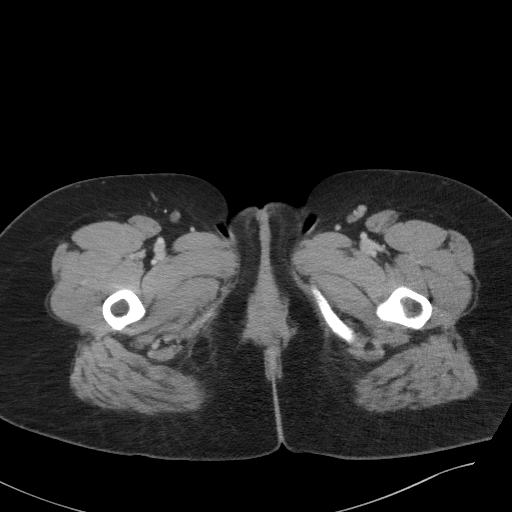
[im 4/96  bone]
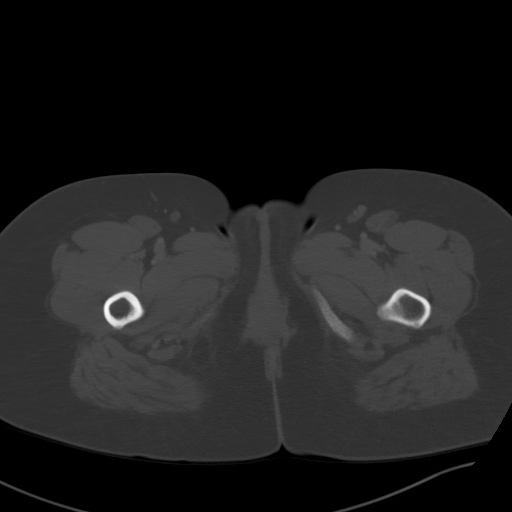
[im 12/96  soft-tissue]
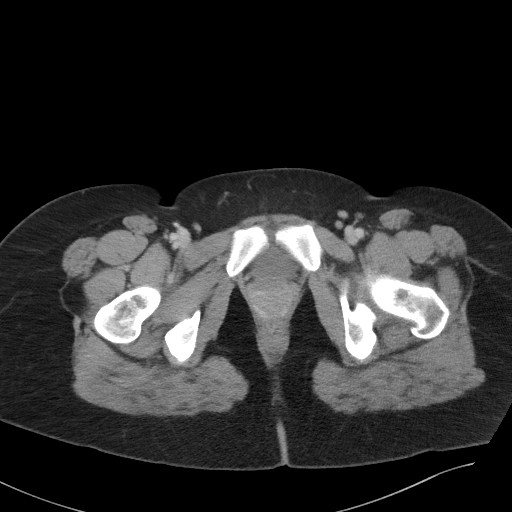
[im 20/96  soft-tissue]
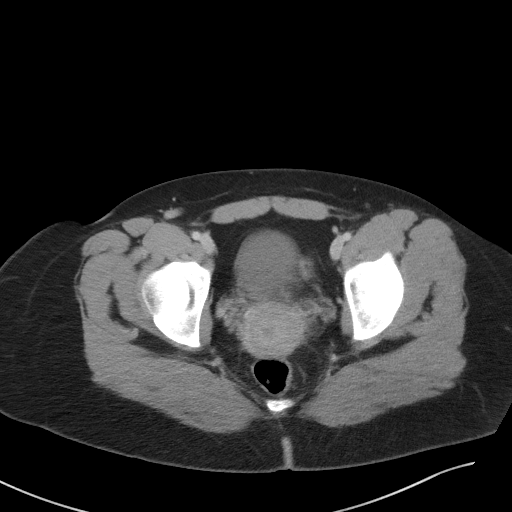
[im 24/96  soft-tissue]
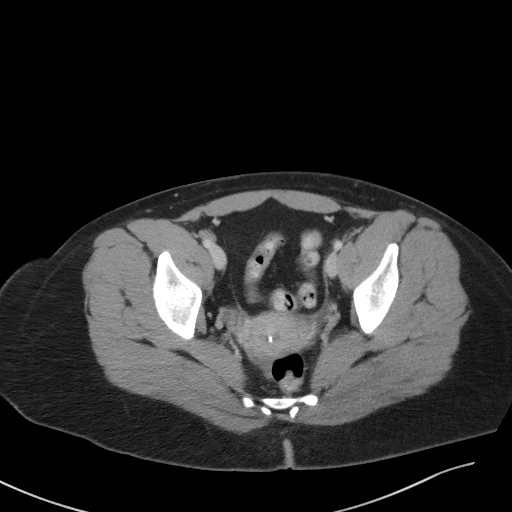
[im 32/96  soft-tissue]
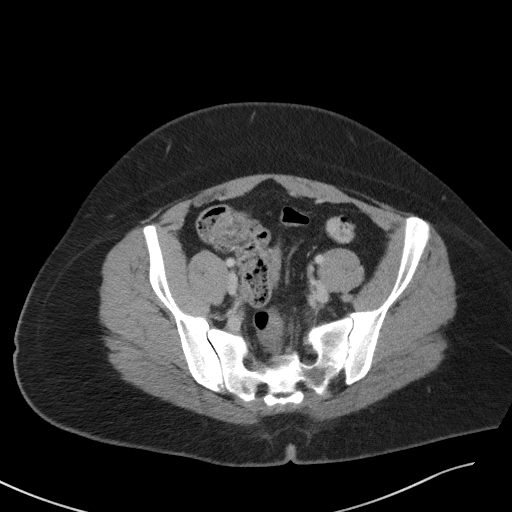
[im 40/96  soft-tissue]
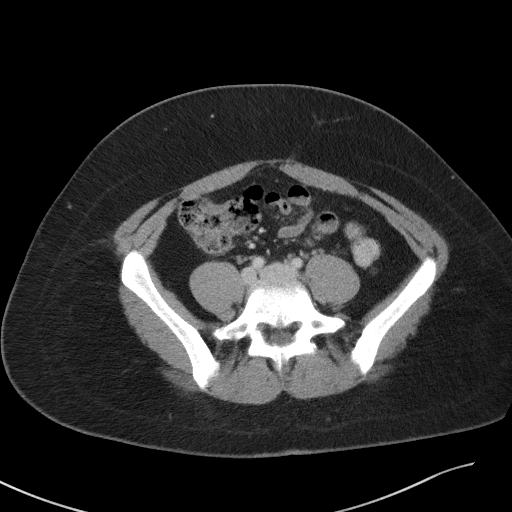
[im 44/96  soft-tissue]
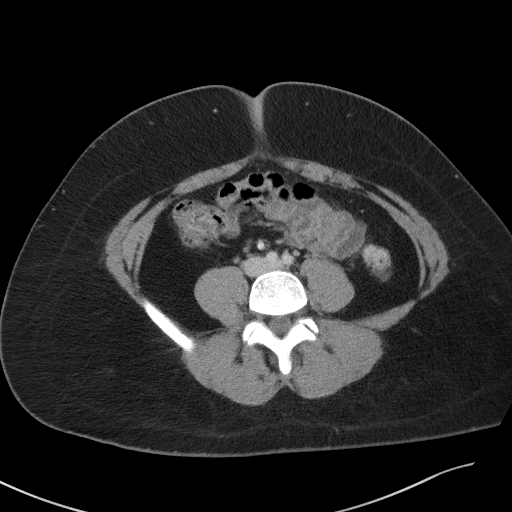
[im 52/96  soft-tissue]
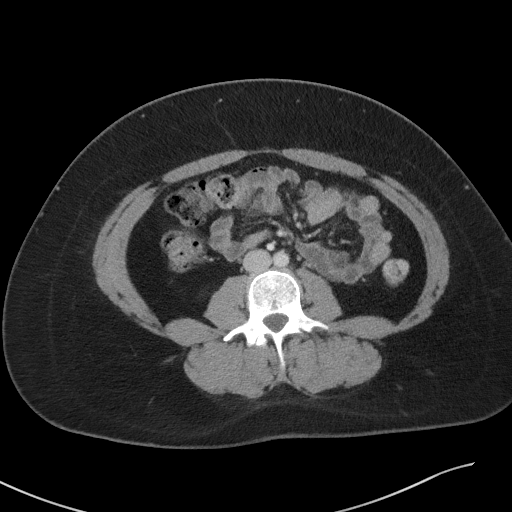
[im 56/96  soft-tissue]
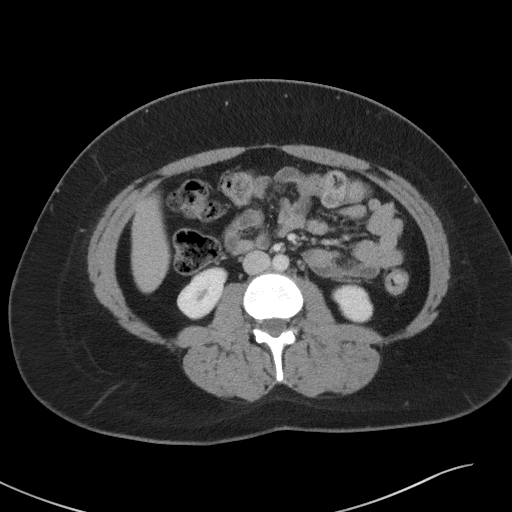
[im 56/96  bone]
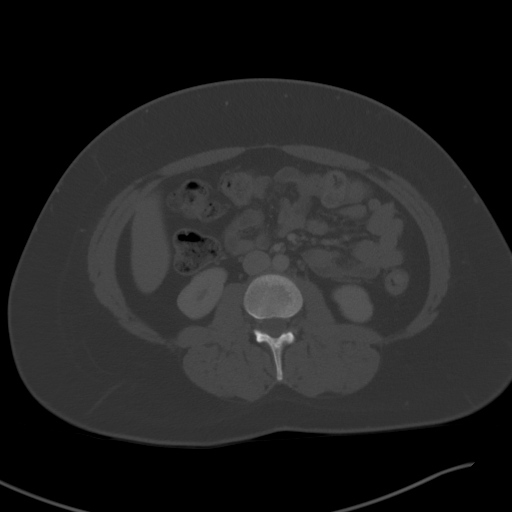
[im 64/96  soft-tissue]
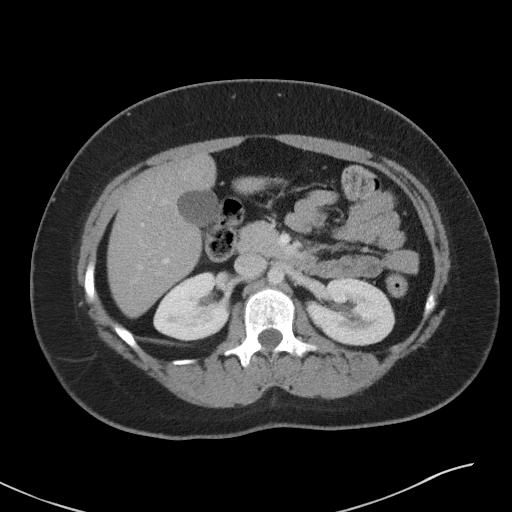
[im 72/96  soft-tissue]
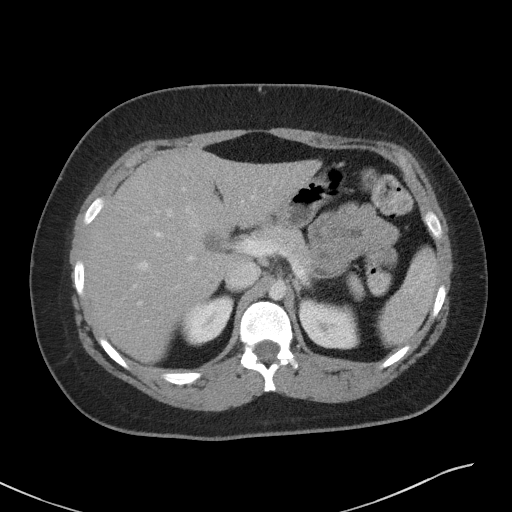
[im 76/96  soft-tissue]
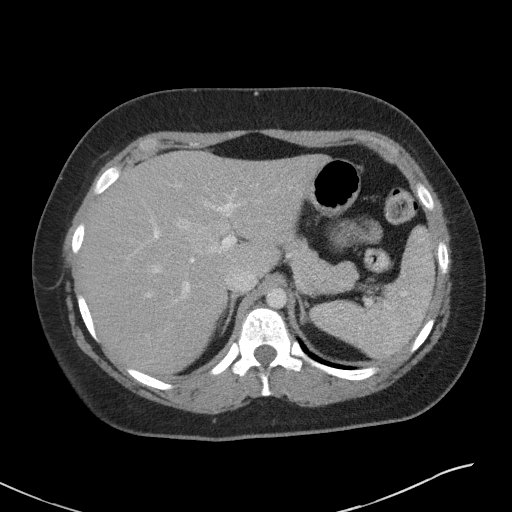
[im 84/96  soft-tissue]
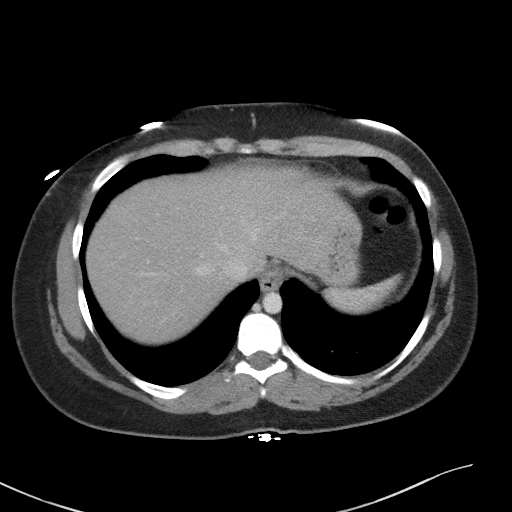
[im 92/96  soft-tissue]
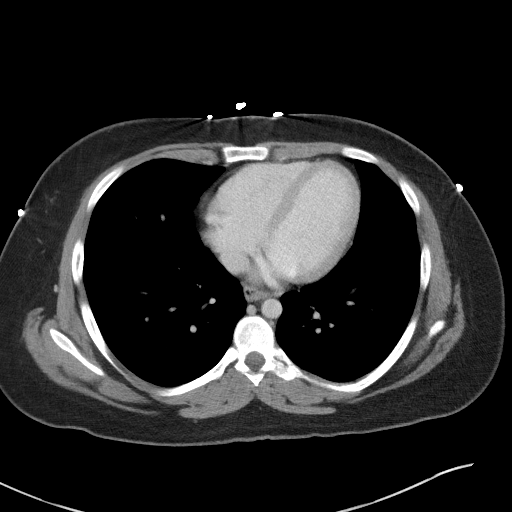

[Series 5: coronal st · coronal · 0.83mm/px · 3 of 94 slices shown]
[im 32/94  soft-tissue]
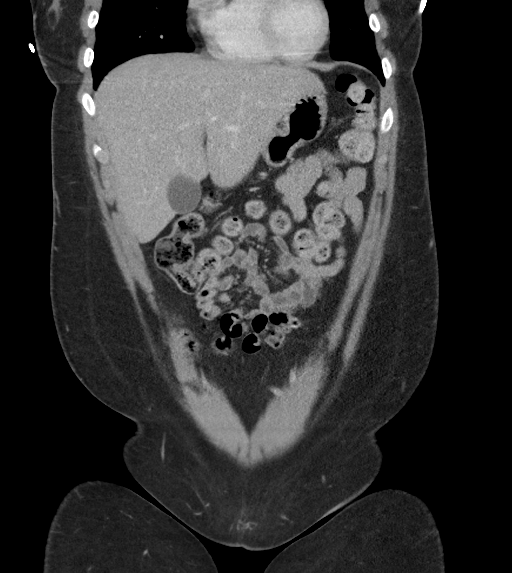
[im 42/94  soft-tissue]
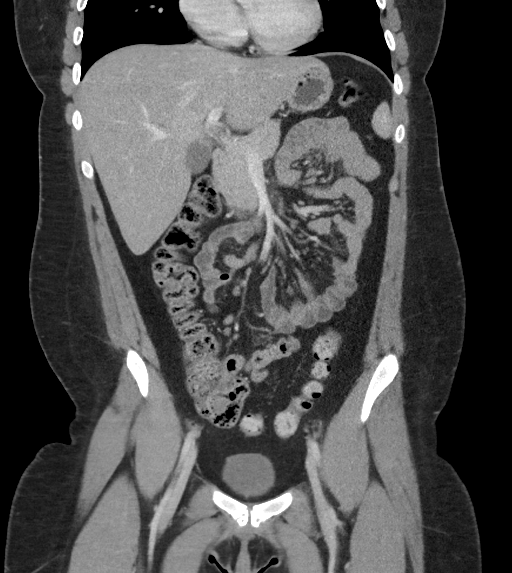
[im 52/94  soft-tissue]
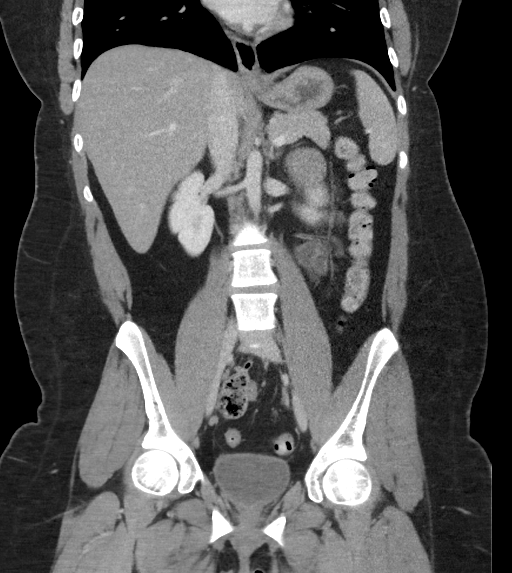

[17 of 46 positions shown; findings below may reference images not displayed]

FINDINGS: Lower chest:  No contributory findings.

Hepatobiliary: No focal liver abnormality.No evidence of biliary
obstruction or stone.

Pancreas: Unremarkable.

Spleen: Unremarkable.

Adrenals/Urinary Tract: Negative adrenals. No hydronephrosis or
stone. Unremarkable bladder.

Stomach/Bowel:  No obstruction. No appendicitis.

Vascular/Lymphatic: No acute vascular abnormality. No mass or
adenopathy.

Reproductive:IUD in expected position.

Other: No ascites or pneumoperitoneum.

Musculoskeletal: No acute abnormalities.
IMPRESSION: Negative abdominal CT.  No appendicitis.

## 2021-06-26 ENCOUNTER — Other Ambulatory Visit: Payer: Self-pay | Admitting: Obstetrics & Gynecology

## 2021-06-26 ENCOUNTER — Telehealth: Payer: Self-pay

## 2021-06-26 NOTE — Telephone Encounter (Signed)
PT CALLED AND STATED SHE IS ON THE DEPO SHOT AND SHE IS BLEEDING HEAVILY AND PASSING LARGE CLOTS.  PT WANTS ADVICE ON WHAT SHE SHOULD DO. ?

## 2021-06-26 NOTE — Telephone Encounter (Signed)
Attempted to call patient back and heard message that call cannot be completed at this time.  ?

## 2021-06-30 ENCOUNTER — Telehealth: Payer: Self-pay | Admitting: *Deleted

## 2021-06-30 DIAGNOSIS — N921 Excessive and frequent menstruation with irregular cycle: Secondary | ICD-10-CM

## 2021-06-30 NOTE — Telephone Encounter (Signed)
Pt states that she went to a [facility] in [place] for bleeding related to her depo. They told her that she might have had a miscarriage. Her UPT in the ER was negative. They did not check a quant. They do not have ultrasound in that ER so told her to check with [place] about what to do next. I told her I would check with a provider to see if she needed to do anything else. Pt is available to come in tomorrow or Wednesday this week if needed.  ?

## 2021-06-30 NOTE — Telephone Encounter (Signed)
Patient stated that she went to the hospital that night and they think she may have had a miscarriage. Please advise.  ?

## 2021-07-01 NOTE — Telephone Encounter (Signed)
Called and left message that Audrey Matthews would like for her to have an hcg level checked. Pt to call back and let us know when shes coming.  ?

## 2021-07-01 NOTE — Addendum Note (Signed)
Addended by: Annamarie Dawley on: 07/01/2021 12:31 PM ? ? Modules accepted: Orders ? ?

## 2021-07-15 ENCOUNTER — Ambulatory Visit (INDEPENDENT_AMBULATORY_CARE_PROVIDER_SITE_OTHER): Payer: Medicaid Other

## 2021-07-15 DIAGNOSIS — Z3042 Encounter for surveillance of injectable contraceptive: Secondary | ICD-10-CM

## 2021-07-15 MED ORDER — MEDROXYPROGESTERONE ACETATE 150 MG/ML IM SUSY: PREFILLED_SYRINGE | Freq: Once | INTRAMUSCULAR | Status: AC

## 2021-07-15 NOTE — Progress Notes (Signed)
? ?  NURSE VISIT- INJECTION ? ?SUBJECTIVE:  ?Audrey Matthews is a 21 y.o. G21P0101 female here for a Depo Provera for contraception/period management. She is a GYN patient.  ? ?OBJECTIVE:  ?There were no vitals taken for this visit.  ?Appears well, in no apparent distress ? ?Injection administered in: Left deltoid ? ?Meds ordered this encounter  ?Medications  ? medroxyPROGESTERone Acetate SUSY  ? ? ?ASSESSMENT: ?GYN patient Depo Provera for contraception/period management ?PLAN: ?Follow-up: in 11-13 weeks for next Depo  ? ? A   ?07/15/2021 ?2:40 PM ? ?

## 2021-09-25 ENCOUNTER — Telehealth: Payer: Self-pay

## 2021-09-25 NOTE — Telephone Encounter (Signed)
Is on depo and is having menstrual symptoms to the post of N&V and pain

## 2021-09-25 NOTE — Telephone Encounter (Addendum)
Pt is on Depo and has been on X 1 year. Pt is having menstral pain to the point of n/v. This is the first time she has noticed this. Next Depo is due July 11-21. Bleeding X 10 days; goes from heavy to light. Please advise. Thanks! JSY

## 2021-09-25 NOTE — Telephone Encounter (Signed)
Having menstrual pains to the point of N&V and is pain. She is on depo and next shot range is July 11-21

## 2021-09-25 NOTE — Telephone Encounter (Signed)
Pt states her pain is not as bad today as it was yesterday. If it got bad again, pt states she will go to Urgent Care. She didn't want to make an appt with [place] at this time. JSY

## 2021-10-07 ENCOUNTER — Ambulatory Visit: Payer: Medicaid Other

## 2022-01-30 ENCOUNTER — Other Ambulatory Visit: Payer: Self-pay

## 2022-01-30 ENCOUNTER — Emergency Department (HOSPITAL_COMMUNITY): Payer: Medicaid Other

## 2022-01-30 ENCOUNTER — Encounter (HOSPITAL_COMMUNITY): Payer: Self-pay

## 2022-01-30 ENCOUNTER — Emergency Department (HOSPITAL_COMMUNITY)
Admission: EM | Admit: 2022-01-30 | Discharge: 2022-01-30 | Disposition: A | Discharge status: Home / Self Care | Payer: Medicaid Other | Attending: Emergency Medicine | Admitting: Emergency Medicine

## 2022-01-30 DIAGNOSIS — R4189 Other symptoms and signs involving cognitive functions and awareness: Secondary | ICD-10-CM | POA: Insufficient documentation

## 2022-01-30 DIAGNOSIS — F840 Autistic disorder: Secondary | ICD-10-CM | POA: Diagnosis not present

## 2022-01-30 DIAGNOSIS — R519 Headache, unspecified: Secondary | ICD-10-CM | POA: Diagnosis present

## 2022-01-30 DIAGNOSIS — J45909 Unspecified asthma, uncomplicated: Secondary | ICD-10-CM | POA: Insufficient documentation

## 2022-01-30 DIAGNOSIS — N643 Galactorrhea not associated with childbirth: Secondary | ICD-10-CM | POA: Diagnosis not present

## 2022-01-30 DIAGNOSIS — Z91040 Latex allergy status: Secondary | ICD-10-CM | POA: Insufficient documentation

## 2022-01-30 LAB — CBC WITH DIFFERENTIAL/PLATELET
Abs Immature Granulocytes: 0.03 10*3/uL (ref 0.00–0.07)
Basophils Absolute: 0 10*3/uL (ref 0.0–0.1)
Basophils Relative: 0 %
Eosinophils Absolute: 0.2 10*3/uL (ref 0.0–0.5)
Eosinophils Relative: 2 %
HCT: 40.8 % (ref 36.0–46.0)
Hemoglobin: 13.3 g/dL (ref 12.0–15.0)
Immature Granulocytes: 0 %
Lymphocytes Relative: 24 %
Lymphs Abs: 2.4 10*3/uL (ref 0.7–4.0)
MCH: 28.1 pg (ref 26.0–34.0)
MCHC: 32.6 g/dL (ref 30.0–36.0)
MCV: 86.1 fL (ref 80.0–100.0)
Monocytes Absolute: 0.9 10*3/uL (ref 0.1–1.0)
Monocytes Relative: 9 %
Neutro Abs: 6.3 10*3/uL (ref 1.7–7.7)
Neutrophils Relative %: 65 %
Platelets: 237 10*3/uL (ref 150–400)
RBC: 4.74 MIL/uL (ref 3.87–5.11)
RDW: 14.1 % (ref 11.5–15.5)
WBC: 9.7 10*3/uL (ref 4.0–10.5)
nRBC: 0 % (ref 0.0–0.2)

## 2022-01-30 LAB — COMPREHENSIVE METABOLIC PANEL
ALT: 17 U/L (ref 0–44)
AST: 19 U/L (ref 15–41)
Albumin: 3.9 g/dL (ref 3.5–5.0)
Alkaline Phosphatase: 60 U/L (ref 38–126)
Anion gap: 8 (ref 5–15)
BUN: 9 mg/dL (ref 6–20)
CO2: 25 mmol/L (ref 22–32)
Calcium: 9.5 mg/dL (ref 8.9–10.3)
Chloride: 108 mmol/L (ref 98–111)
Creatinine, Ser: 0.62 mg/dL (ref 0.44–1.00)
GFR, Estimated: >60 mL/min (ref >60)
Glucose, Bld: 97 mg/dL (ref 70–99)
Potassium: 3.7 mmol/L (ref 3.5–5.1)
Sodium: 141 mmol/L (ref 135–145)
Total Bilirubin: 0.5 mg/dL (ref 0.3–1.2)
Total Protein: 7.4 g/dL (ref 6.5–8.1)

## 2022-01-30 LAB — PREGNANCY, URINE: Preg Test, Ur: NEGATIVE

## 2022-01-30 MED ORDER — DIPHENHYDRAMINE HCL 50 MG/ML IJ SOLN
25.0000 mg | Freq: Once | INTRAMUSCULAR | Status: AC
  Administered 2022-01-30: 25 mg via INTRAVENOUS
  Filled 2022-01-30: qty 1

## 2022-01-30 MED ORDER — SODIUM CHLORIDE 0.9 % IV BOLUS
1000.0000 mL | Freq: Once | INTRAVENOUS | Status: AC
  Administered 2022-01-30: 1000 mL via INTRAVENOUS

## 2022-01-30 MED ORDER — PROCHLORPERAZINE EDISYLATE 10 MG/2ML IJ SOLN
10.0000 mg | Freq: Once | INTRAMUSCULAR | Status: AC
  Administered 2022-01-30: 10 mg via INTRAVENOUS
  Filled 2022-01-30: qty 2

## 2022-01-30 MED ORDER — KETOROLAC TROMETHAMINE 15 MG/ML IJ SOLN
15.0000 mg | Freq: Once | INTRAMUSCULAR | Status: AC
  Administered 2022-01-30: 15 mg via INTRAVENOUS
  Filled 2022-01-30: qty 1

## 2022-01-30 NOTE — ED Triage Notes (Addendum)
Pt reports has a "pressure  headache" in the back of her head x 2 days.  Reports difficulty remembering things x 3 days.  Reports had a toothache yesterday but not today.  Denies injury.  Says she did fall a few days ago but didn't hit her head.  Reports feeling dizzy and lightheaded yesterday and this morning.  Not at this time.  Also reports has been taking delta 8 gummies for degenerative disc disease but has been taking them for over a year.   Pt also says has started lactating for the past week.  Denies pregnancy.

## 2022-01-30 NOTE — Discharge Instructions (Addendum)
Note the work-up today was overall reassuring.  CT scan of the chest showed no acute abnormalities.  Recommend follow-up with women's health provider/PCP for reevaluation and further laboratory studies regarding your lactation.  Please do not hesitate to return to emergency department for worrisome signs and symptoms we discussed become apparent.

## 2022-01-30 NOTE — ED Provider Notes (Signed)
[facility] EMERGENCY DEPARTMENT Provider Note   CSN: [id number] Arrival date & time: 01/30/22  1031     History  No chief complaint on file.   Audrey Matthews is a 21 y.o. female.  HPI   21 year old female presents emergency department with complaints of posterior headache, forgetfulness.  Patient states that she remembers sparsely of events over the past 3 days.  She reports no recent change in medication.  She reports no trauma to head.  She does states that she fell 3 to 4 days ago falling down the steps landing on her tailbone.  She denies any subsequent pain from that incident.  She states gradual onset of headache with worsening of pain since onset.  States she took to ibuprofen at home with minimal relief of symptoms.  She denies any drug/alcohol use but does states she has been using delta 8 Gummies over the past year for back pain of which she has not increased her dose recently.  Denies any weakness/sensory deficits, visual disturbance, slurred speech, facial droop, difficulty ambulating.  Last menstrual period 01/17/2022  Past medical history significant for PTSD, autism, ADHD, anxiety, asthma, borderline personality disorder, MDD  Home Medications Prior to Admission medications   Medication Sig Start Date End Date Taking? Authorizing Provider  acetaminophen (TYLENOL) 500 MG tablet Take 500 mg by mouth every 6 (six) hours as needed for mild pain or headache.    [provider]  ADDERALL XR 10 MG 24 hr capsule Take 10 mg by mouth every morning. 03/07/20   [provider]  albuterol (VENTOLIN HFA) 108 (90 Base) MCG/ACT inhaler Inhale 2 puffs into the lungs every 6 (six) hours as needed for wheezing or shortness of breath. 12/20/18   [provider]  ARIPiprazole (ABILIFY) 10 MG tablet Take 20 mg by mouth at bedtime. 03/11/20   [provider]  medroxyPROGESTERone Acetate 150 MG/ML SUSY INJECT ONCE EVERY 3 MONTHS 06/26/21   Lazaro Arms, MD   sertraline (ZOLOFT) 100 MG tablet Take 100 mg by mouth daily. 09/03/20   [provider]      Allergies    Latex    Review of Systems   Review of Systems  All other systems reviewed and are negative.   Physical Exam Updated Vital Signs BP 120/72 (BP Location: Right Arm)   Pulse 88   Temp 98.4 F (36.9 C) (Oral)   Resp 16   Ht 5\' 1"  (1.549 m)   Wt 78 kg   LMP 01/12/2022 (Approximate)   SpO2 99%   BMI 32.50 kg/m  Physical Exam Vitals and nursing note reviewed.  Constitutional:      General: She is not in acute distress.    Appearance: She is well-developed.  HENT:     Head: Normocephalic and atraumatic.  Eyes:     Conjunctiva/sclera: Conjunctivae normal.  Cardiovascular:     Rate and Rhythm: Normal rate and regular rhythm.     Heart sounds: No murmur heard. Pulmonary:     Effort: Pulmonary effort is normal. No respiratory distress.     Breath sounds: Normal breath sounds.  Abdominal:     Palpations: Abdomen is soft.     Tenderness: There is no abdominal tenderness.  Musculoskeletal:        General: No swelling.     Cervical back: Neck supple.  Skin:    General: Skin is warm and dry.     Capillary Refill: Capillary refill takes less than 2 seconds.  Neurological:     Mental Status: She is alert.     Comments: Alert and oriented to self, place, time and event.   Speech is fluent, clear without dysarthria or dysphasia.   Strength 5/5 in upper/lower extremities   Sensation intact in upper/lower extremities   Normal gait.  Negative Romberg. No pronator drift.  Normal finger-to-nose and feet tapping.  CN I not tested  CN II grossly intact visual fields bilaterally. Did not visualize posterior eye.  CN III, IV, VI PERRLA and EOMs intact bilaterally  CN V Intact sensation to sharp and light touch to the face  CN VII facial movements symmetric  CN VIII not tested  CN IX, X no uvula deviation, symmetric rise of soft palate  CN XI 5/5 SCM and trapezius  strength bilaterally  CN XII Midline tongue protrusion, symmetric L/R movements   Psychiatric:        Mood and Affect: Mood normal.     ED Results / Procedures / Treatments   Labs (all labs ordered are listed, but only abnormal results are displayed) Labs Reviewed  PREGNANCY, URINE  COMPREHENSIVE METABOLIC PANEL  CBC WITH DIFFERENTIAL/PLATELET    EKG None  Radiology CT Head Wo Contrast  Result Date: 01/30/2022 CLINICAL DATA:  Possible prolactinoma EXAM: CT HEAD WITHOUT CONTRAST TECHNIQUE: Contiguous axial images were obtained from the base of the skull through the vertex without intravenous contrast. RADIATION DOSE REDUCTION: This exam was performed according to the departmental dose-optimization program which includes automated exposure control, adjustment of the mA and/or kV according to patient size and/or use of iterative reconstruction technique. COMPARISON:  None Available. FINDINGS: There is patient motion in some of the images in the base of the skull region. Additional images were repeated. Brain: No acute intracranial findings are seen. There are no signs of bleeding within the cranium. Ventricles are not dilated. There is no focal edema or mass effect. Vascular: Unremarkable. Skull: No displaced fracture is seen. Sinuses/Orbits: Unremarkable. Other: Sella is not enlarged. There is no definite enlargement of pituitary. Suprasellar cistern appears unremarkable. IMPRESSION: No acute intracranial findings are seen. There is no demonstrable enlargement of pituitary. Electronically Signed   By: Ernie Avena M.D.   On: 01/30/2022 15:13    Procedures Procedures    Medications Ordered in ED Medications  prochlorperazine (COMPAZINE) injection 10 mg (10 mg Intravenous Given 01/30/22 1237)  diphenhydrAMINE (BENADRYL) injection 25 mg (25 mg Intravenous Given 01/30/22 1239)  sodium chloride 0.9 % bolus 1,000 mL (0 mLs Intravenous Stopped 01/30/22 1619)  ketorolac (TORADOL) 15  MG/ML injection 15 mg (15 mg Intravenous Given 01/30/22 1236)    ED Course/ Medical Decision Making/ A&P                            Medical Decision Making Amount and/or Complexity of Data Reviewed Labs: ordered. Radiology: ordered.  Risk Prescription drug management.   This patient presents to the ED for concern of headache, this involves an extensive number of treatment options, and is a complaint that carries with it a high risk of complications and morbidity.  The differential diagnosis includes Emergent considerations for headache include subarachnoid hemorrhage, meningitis, temporal arteritis, glaucoma, cerebral ischemia, carotid/vertebral dissection, intracranial tumor, Venous sinus thrombosis, acute or chronic subdural hemorrhage.  Other considerations include: Migraine, Cluster headache, Hypertension, Pseudotumor cerebri,  Head injury,  Tension headache, Sinusitis, Cervical arthritis, Refractive error causing strain, Dental abscess, Otitis media, Temporomandibular joint syndrome, Trigeminal neuralgia,  Glossopharyngeal neuralgia.   Co morbidities that complicate the patient evaluation  See HPI   Additional history obtained:  Additional history obtained from EMR External records from outside source obtained and reviewed including hospital records   Lab Tests:  I Ordered, and personally interpreted labs.  The pertinent results include: No leukocytosis noted.  No evidence of anemia.  Platelets within normal range.  No electrolyte abnormalities noted.  No transaminitis.  Renal function within normal limits.  Urine pregnancy negative.   Imaging Studies ordered:  I ordered imaging studies including CT head I independently visualized and interpreted imaging which showed no acute findings.  No observable enlargement of the pituitary. I agree with the radiologist interpretation   Cardiac Monitoring: / EKG:  The patient was maintained on a cardiac monitor.  I personally  viewed and interpreted the cardiac monitored which showed an underlying rhythm of: Sinus rhythm   Consultations Obtained:  Consulted attending physician Dr. Suezanne Jacquet regarding the patient.  He agreed with imaging choice as well as discharge was negative.  Problem List / ED Course / Critical interventions / Medication management  Headache I ordered medication including Benadryl, Compazine and Toradol   Reevaluation of the patient after these medicines showed that the patient resolved I have reviewed the patients home medicines and have made adjustments as needed   Social Determinants of Health:  Denies tobacco, illicit drug use   Test / Admission - Considered:  Headache/lactation Vitals signs within normal range and stable throughout visit. Laboratory/imaging studies significant for: See above Patient's symptoms of headache completely resolved the emergency department with administration of migraine cocktail.  CT imaging was pursued given complaint of headache with weeklong of lactation for assessment of possible enlargement of pituitary.  Given negative findings and resolution of symptoms and lack of any neurologic finding on exam., patient deemed able to be managed outpatient for further work-up.  Patient recommended to follow-up with PCP for further work-up and possible referral to endocrinology.  Treatment plan discussed along with patient she can understand was agreeable to said plan. Worrisome signs and symptoms were discussed with the patient, and the patient acknowledged understanding to return to the ED if noticed. Patient was stable upon discharge.          Final Clinical Impression(s) / ED Diagnoses Final diagnoses:  Acute nonintractable headache, unspecified headache type  Inappropriate lactation    Rx / DC Orders ED Discharge Orders     None         Peter Garter, [place] 01/30/22 1707    Lonell Grandchild, MD 02/02/22 [phone]

## 2022-02-06 ENCOUNTER — Ambulatory Visit (INDEPENDENT_AMBULATORY_CARE_PROVIDER_SITE_OTHER): Payer: Medicaid Other | Admitting: Adult Health

## 2022-02-06 ENCOUNTER — Encounter: Payer: Self-pay | Admitting: Adult Health

## 2022-02-06 VITALS — BP 116/84 | HR 82 | Ht 61.5 in | Wt 174.0 lb

## 2022-02-06 DIAGNOSIS — N946 Dysmenorrhea, unspecified: Secondary | ICD-10-CM | POA: Diagnosis not present

## 2022-02-06 DIAGNOSIS — M545 Low back pain, unspecified: Secondary | ICD-10-CM

## 2022-02-06 DIAGNOSIS — Z319 Encounter for procreative management, unspecified: Secondary | ICD-10-CM

## 2022-02-06 DIAGNOSIS — R109 Unspecified abdominal pain: Secondary | ICD-10-CM

## 2022-02-06 DIAGNOSIS — G8929 Other chronic pain: Secondary | ICD-10-CM

## 2022-02-06 HISTORY — DX: Low back pain, unspecified: G89.29

## 2022-02-06 HISTORY — DX: Low back pain, unspecified: M54.50

## 2022-02-06 LAB — POCT URINALYSIS DIPSTICK OB
Glucose, UA: NEGATIVE
Ketones, UA: NEGATIVE
Leukocytes, UA: NEGATIVE
Nitrite, UA: NEGATIVE

## 2022-02-06 MED ORDER — CYCLOBENZAPRINE HCL 10 MG PO TABS: 10.0000 mg | ORAL_TABLET | Freq: Three times a day (TID) | ORAL | 1 refills | Status: DC | PRN

## 2022-02-06 MED ORDER — PRENATAL PLUS 27-1 MG PO TABS: 1.0000 | ORAL_TABLET | Freq: Every day | ORAL | 12 refills | Status: DC

## 2022-02-06 NOTE — Progress Notes (Signed)
  Subjective:     Patient ID: Audrey Matthews, female   DOB: Jan 22, 2001, 21 y.o.   MRN: [id number]  HPI [name] is a 21 year old white female, married, G1P0101, in complaining of low back pain on the left and pelvic pain with her period, and has been trying to get pregnant for 6 months. Has sex 3 x a week.  Last pap was 8/16/222 negative HPV and malignancy.  PCP is A Boles PA  Review of Systems ow back pain on the left and pelvic pain with her period, and has been trying to get pregnant for 6 months.  Reviewed past medical,surgical, social and family history. Reviewed medications and allergies.     Objective:   Physical Exam BP 116/84 (BP Location: Left Arm, Patient Position: Sitting, Cuff Size: Normal)   Pulse 82   Ht 5' 1.5" (1.562 m)   Wt 174 lb (78.9 kg)   LMP 02/03/2022 (Approximate)   BMI 32.34 kg/m  Urine dipstick large blood, trace protein. Skin warm and dry.Pelvic: external genitalia is normal in appearance no lesions, vagina: +period blood,urethra has no lesions or masses noted, cervix:smooth and bulbous, uterus: normal size, shape and contour, mildly tender, no masses felt, adnexa: no masses or tenderness noted. Bladder is non tender and no masses felt.No CVAT has point tenderness left lower back, feels tight. Fall risk is low    Upstream - 02/06/22 1020       Pregnancy Intention Screening   Does the patient want to become pregnant in the next year? Yes    Does the patient's partner want to become pregnant in the next year? Yes    Would the patient like to discuss contraceptive options today? No      Contraception Wrap Up   Current Method Pregnant/Seeking Pregnancy    End Method Pregnant/Seeking Pregnancy    Contraception Counseling Provided No              Examination chaperoned by Dorthula Perfect RN  Assessment:     1. Abdominal pain, unspecified abdominal location  2. Dysmenorrhea  3. Chronic left-sided low back pain without sciatica Try heat and ice Will rx  flexeril  Meds ordered this encounter  Medications   prenatal vitamin w/FE, FA (PRENATAL 1 + 1) 27-1 MG TABS tablet    Sig: Take 1 tablet by mouth daily at 12 noon.    Dispense:  30 tablet    Refill:  12    Order Specific Question:   Supervising Provider    Answer:   Duane Lope H [[id number]]   cyclobenzaprine (FLEXERIL) 10 MG tablet    Sig: Take 1 tablet (10 mg total) by mouth every 8 (eight) hours as needed for muscle spasms.    Dispense:  30 tablet    Refill:  1    Order Specific Question:   Supervising Provider    Answer:   Despina Hidden, LUTHER H [[id number]]      4. Patient desires pregnancy    Take PNV Will check progesterone level 02/23/22 day 21 of cycle to see if ovulating  Discussed could take 6-18 months of active trying  Plan:      Follow up prn

## 2022-02-24 LAB — PROGESTERONE: Progesterone: 0.1 ng/mL

## 2022-02-27 ENCOUNTER — Other Ambulatory Visit: Payer: Self-pay | Admitting: Adult Health

## 2022-02-27 MED ORDER — CLOMIPHENE CITRATE 50 MG PO TABS: ORAL_TABLET | ORAL | 2 refills | Status: DC

## 2022-02-27 NOTE — Progress Notes (Signed)
Will rx clomid

## 2022-03-19 ENCOUNTER — Other Ambulatory Visit: Payer: Self-pay | Admitting: Adult Health

## 2022-03-19 DIAGNOSIS — Z319 Encounter for procreative management, unspecified: Secondary | ICD-10-CM

## 2022-03-19 NOTE — Progress Notes (Signed)
Ck progesterone 1/15

## 2022-03-23 NOTE — L&D Delivery Note (Signed)
OB/[facility]N Faculty Practice Delivery Note  Audrey Matthews is a 22 y.o. [id number] s/p SVD at [redacted]w[redacted]d. She was admitted for PTL.   ROM: 1h 99m with clear fluid GBS Status:  PRESUMPTIVE NEGATIVE/-- (10/12 2214) Maximum Maternal Temperature: 98.12F  Labor Progress: Initial SVE: 5.5/90/-2 BBOW. Progressed to 7cm without intervention. Then augmented with AROM. She then quickly progressed to complete.   Delivery Date/Time: 01/03/23 @1633  Delivery: Called to room and patient was complete and pushing. Head delivered ROA. Loose nuchal cord delivered through. Shoulder and body delivered in usual fashion. Infant with spontaneous cry, placed on mother's abdomen with assistance of FOB, dried and stimulated. Cord clamped x 2 at 1-minute, and cut by FOB. Cord blood drawn. Placenta delivered spontaneously with gentle cord traction. Fundus firm with massage and Pitocin. Labia, perineum, vagina, and cervix inspected with small shallow hemostatic abrasions only. No repair.   Baby Weight: 2430g  Placenta: 3 vessel, intact. Sent to L&D Complications: None Lacerations: None EBL: 137 mL Analgesia: IV Fentanyl at 7cm  Infant:  APGAR (1 MIN):  8 APGAR (5 MINS):  9  Wyn Forster, MD OB Family Medicine Fellow, [facility] for [facility], [facility] 01/03/2023, 4:51 PM

## 2022-05-21 ENCOUNTER — Encounter: Payer: Self-pay | Admitting: *Deleted

## 2022-06-01 ENCOUNTER — Ambulatory Visit (INDEPENDENT_AMBULATORY_CARE_PROVIDER_SITE_OTHER): Payer: Medicaid Other

## 2022-06-01 VITALS — BP 140/80 | HR 113 | Ht 61.0 in | Wt 168.2 lb

## 2022-06-01 DIAGNOSIS — Z3201 Encounter for pregnancy test, result positive: Secondary | ICD-10-CM | POA: Diagnosis not present

## 2022-06-01 DIAGNOSIS — N926 Irregular menstruation, unspecified: Secondary | ICD-10-CM | POA: Diagnosis not present

## 2022-06-01 LAB — POCT URINE PREGNANCY: Preg Test, Ur: POSITIVE — AB

## 2022-06-01 NOTE — Progress Notes (Addendum)
   NURSE VISIT- PREGNANCY CONFIRMATION   SUBJECTIVE:  Audrey Matthews is a 22 y.o. G63P0101 female at [redacted]w[redacted]d by certain LMP of Patient's last menstrual period was 04/26/2022. Here for pregnancy confirmation.  Home pregnancy test: positive x 2   She reports no complaints.  She is taking prenatal vitamins.    OBJECTIVE:  BP (!) 140/80 (BP Location: Right Arm, Patient Position: Sitting, Cuff Size: Normal)   Pulse (!) 113   Ht 5\' 1"  (1.549 m)   Wt 168 lb 3.2 oz (76.3 kg)   LMP 04/26/2022   BMI 31.78 kg/m   Appears well, in no apparent distress  Results for orders placed or performed in visit on 06/01/22 (from the past 24 hour(s))  POCT urine pregnancy   Collection Time: 06/01/22  2:55 PM  Result Value Ref Range   Preg Test, Ur Positive (A) Negative    ASSESSMENT: Positive pregnancy test, [redacted]w[redacted]d by LMP    PLAN: Schedule for dating ultrasound in 3 weeks Prenatal vitamins: continue   Nausea medicines: not currently needed   OB packet given: Yes Advised to start weaning Adderall if possible.    Kristeen Miss   06/01/2022 2:57 PM

## 2022-06-15 ENCOUNTER — Telehealth: Payer: Self-pay | Admitting: *Deleted

## 2022-06-15 ENCOUNTER — Encounter (HOSPITAL_COMMUNITY): Payer: Self-pay | Admitting: Family Medicine

## 2022-06-15 ENCOUNTER — Inpatient Hospital Stay (HOSPITAL_COMMUNITY)
Admission: AD | Admit: 2022-06-15 | Discharge: 2022-06-15 | Disposition: A | Discharge status: Home / Self Care | Payer: Medicaid Other | Attending: Family Medicine | Admitting: Family Medicine

## 2022-06-15 ENCOUNTER — Inpatient Hospital Stay (HOSPITAL_COMMUNITY): Payer: Medicaid Other

## 2022-06-15 DIAGNOSIS — F840 Autistic disorder: Secondary | ICD-10-CM | POA: Insufficient documentation

## 2022-06-15 DIAGNOSIS — J45909 Unspecified asthma, uncomplicated: Secondary | ICD-10-CM | POA: Diagnosis not present

## 2022-06-15 DIAGNOSIS — O209 Hemorrhage in early pregnancy, unspecified: Secondary | ICD-10-CM | POA: Diagnosis present

## 2022-06-15 DIAGNOSIS — Z79899 Other long term (current) drug therapy: Secondary | ICD-10-CM | POA: Diagnosis not present

## 2022-06-15 DIAGNOSIS — O99341 Other mental disorders complicating pregnancy, first trimester: Secondary | ICD-10-CM | POA: Insufficient documentation

## 2022-06-15 DIAGNOSIS — R102 Pelvic and perineal pain: Secondary | ICD-10-CM | POA: Insufficient documentation

## 2022-06-15 DIAGNOSIS — O3432 Maternal care for cervical incompetence, second trimester: Secondary | ICD-10-CM | POA: Diagnosis not present

## 2022-06-15 DIAGNOSIS — O26899 Other specified pregnancy related conditions, unspecified trimester: Secondary | ICD-10-CM

## 2022-06-15 DIAGNOSIS — F4310 Post-traumatic stress disorder, unspecified: Secondary | ICD-10-CM | POA: Insufficient documentation

## 2022-06-15 DIAGNOSIS — F909 Attention-deficit hyperactivity disorder, unspecified type: Secondary | ICD-10-CM | POA: Diagnosis not present

## 2022-06-15 DIAGNOSIS — Z3A01 Less than 8 weeks gestation of pregnancy: Secondary | ICD-10-CM

## 2022-06-15 DIAGNOSIS — R109 Unspecified abdominal pain: Secondary | ICD-10-CM

## 2022-06-15 DIAGNOSIS — O99511 Diseases of the respiratory system complicating pregnancy, first trimester: Secondary | ICD-10-CM | POA: Diagnosis not present

## 2022-06-15 DIAGNOSIS — O26891 Other specified pregnancy related conditions, first trimester: Secondary | ICD-10-CM | POA: Insufficient documentation

## 2022-06-15 DIAGNOSIS — F32A Depression, unspecified: Secondary | ICD-10-CM | POA: Insufficient documentation

## 2022-06-15 DIAGNOSIS — O3680X0 Pregnancy with inconclusive fetal viability, not applicable or unspecified: Secondary | ICD-10-CM | POA: Diagnosis not present

## 2022-06-15 LAB — CBC
HCT: 40 % (ref 36.0–46.0)
Hemoglobin: 13.3 g/dL (ref 12.0–15.0)
MCH: 28.3 pg (ref 26.0–34.0)
MCHC: 33.3 g/dL (ref 30.0–36.0)
MCV: 85.1 fL (ref 80.0–100.0)
Platelets: 321 10*3/uL (ref 150–400)
RBC: 4.7 MIL/uL (ref 3.87–5.11)
RDW: 13.2 % (ref 11.5–15.5)
WBC: 11.2 10*3/uL — ABNORMAL HIGH (ref 4.0–10.5)
nRBC: 0 % (ref 0.0–0.2)

## 2022-06-15 LAB — WET PREP, GENITAL
Clue Cells Wet Prep HPF POC: NONE SEEN
Sperm: NONE SEEN
Trich, Wet Prep: NONE SEEN
WBC, Wet Prep HPF POC: <10 (ref <10)
Yeast Wet Prep HPF POC: NONE SEEN

## 2022-06-15 LAB — URINALYSIS, ROUTINE W REFLEX MICROSCOPIC
Bilirubin Urine: NEGATIVE
Glucose, UA: NEGATIVE mg/dL
Ketones, ur: NEGATIVE mg/dL
Nitrite: NEGATIVE
Protein, ur: NEGATIVE mg/dL
Specific Gravity, Urine: 1.009 (ref 1.005–1.030)
pH: 8 (ref 5.0–8.0)

## 2022-06-15 LAB — COMPREHENSIVE METABOLIC PANEL
ALT: 18 U/L (ref 0–44)
AST: 17 U/L (ref 15–41)
Albumin: 3.9 g/dL (ref 3.5–5.0)
Alkaline Phosphatase: 60 U/L (ref 38–126)
Anion gap: 12 (ref 5–15)
BUN: <5 mg/dL — ABNORMAL LOW (ref 6–20)
CO2: 24 mmol/L (ref 22–32)
Calcium: 9.7 mg/dL (ref 8.9–10.3)
Chloride: 101 mmol/L (ref 98–111)
Creatinine, Ser: 0.6 mg/dL (ref 0.44–1.00)
GFR, Estimated: >60 mL/min (ref >60)
Glucose, Bld: 100 mg/dL — ABNORMAL HIGH (ref 70–99)
Potassium: 3.5 mmol/L (ref 3.5–5.1)
Sodium: 137 mmol/L (ref 135–145)
Total Bilirubin: 0.1 mg/dL — ABNORMAL LOW (ref 0.3–1.2)
Total Protein: 7.2 g/dL (ref 6.5–8.1)

## 2022-06-15 LAB — HCG, QUANTITATIVE, PREGNANCY: hCG, Beta Chain, Quant, S: 110317 m[IU]/mL — ABNORMAL HIGH (ref <5)

## 2022-06-15 MED ORDER — RHO D IMMUNE GLOBULIN 1500 UNIT/2ML IJ SOSY
300.0000 ug | PREFILLED_SYRINGE | Freq: Once | INTRAMUSCULAR | Status: AC
  Administered 2022-06-15: 300 ug via INTRAMUSCULAR
  Filled 2022-06-15: qty 2

## 2022-06-15 NOTE — Telephone Encounter (Signed)
Pt is [redacted] weeks pregnant. She is seeing blood every time she wipes, pink in color. Started today. Has not had sex or a BM. Pt is having period cramps. Pt was advised to watch it for now. Advised no sex for at least 7 days from the last time she wipes any color. Let us know if bleeding gets heavier or if cramps get worse. Keep appt for 4/1 for [place]. Pt voiced understanding. [place]

## 2022-06-15 NOTE — MAU Provider Note (Signed)
Chief Complaint: Abdominal Pain and Vaginal Bleeding   Event Date/Time   First Provider Initiated Contact with Patient 06/15/22 1406        SUBJECTIVE HPI: Audrey Matthews is a 22 y.o. [id number] at [redacted]w[redacted]d by LMP who presents to maternity admissions reporting spotting and mild cramping.  Started today.  History is remarkable for preterm delivery with incompetent cervix per pt report.  Had a SAB last year, so is concerned. She denies vaginal bleeding, vaginal itching/burning, urinary symptoms, h/a, dizziness, n/v, or fever/chills.    Abdominal Pain This is a new problem. The current episode started today. The onset quality is gradual. The problem occurs intermittently. The quality of the pain is described as cramping. The pain does not radiate. Pertinent negatives include no diarrhea, dysuria, fever or frequency. Nothing relieves the symptoms. Past treatments include nothing.  Vaginal Bleeding The patient's primary symptoms include pelvic pain (mild cramping) and vaginal bleeding. The patient's pertinent negatives include no genital itching or genital odor. The current episode started today. Associated symptoms include abdominal pain. Pertinent negatives include no diarrhea, dysuria, fever or frequency.   RN Note: .Audrey Matthews is a 22 y.o. at [redacted]w[redacted]d here in MAU reporting: lower abdominal cramping and spotting that started around 1130. She sees pink when she wipes and did pass a very small red blood clot (smaller than a pea). No recent IC.                                              Pain score: 5  Past Medical History:  Diagnosis Date   ADHD (attention deficit hyperactivity disorder)    Anxiety    Asthma    Autism    Bacterial vaginosis 11/23/2019   Depression    PTSD (post-traumatic stress disorder)    Syncopal episodes    Past Surgical History:  Procedure Laterality Date   CAUTERIZE INNER NOSE     CERVICAL CERCLAGE N/A 04/14/2019   Procedure: CERCLAGE CERVICAL;  Surgeon: Tama High,  MD;  Location: [facility];  Service: Gynecology;  Laterality: N/A;   DENTAL SURGERY     MOUTH SURGERY     WISDOM TOOTH EXTRACTION     Social History   Socioeconomic History   Marital status: Married    Spouse name: Not on file   Number of children: 1   Years of education: Not on file   Highest education level: Not on file  Occupational History   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   She has one daughter - born March 2021 (three month premature).   Right-handed.   No daily caffeine use.   Social Determinants of Health   Financial Resource Strain: Low Risk  (11/05/2020)   Overall Financial Resource Strain (CARDIA)    Difficulty of Paying Living Expenses: Not hard at all  Food Insecurity: Food Insecurity Present (11/05/2020)   Hunger Vital Sign  Worried About Charity fundraiser in the Last Year: Sometimes true    [employer] of Food in the Last Year: Never true  Transportation Needs: No Transportation Needs (11/05/2020)   PRAPARE - Hydrologist (Medical): No    Lack of Transportation (Non-Medical): No  Physical Activity: Insufficiently Active (11/05/2020)   Exercise Vital Sign    Days of Exercise per Week: 3 days    Minutes of Exercise per Session: 40 min  Stress: Stress Concern Present (11/05/2020)   [place]    Feeling of Stress : Rather much  Social Connections: Unknown (11/05/2020)   Social Connection and Isolation Panel [NHANES]    Frequency of Communication with Friends and  Family: More than three times a week    Frequency of Social Gatherings with Friends and Family: Once a week    Attends Religious Services: Patient declined    Marine scientist or Organizations: No    Attends Archivist Meetings: Never    Marital Status: Never married  Intimate Partner Violence: Not At Risk (11/05/2020)   Humiliation, Afraid, Rape, and Kick questionnaire    Fear of Current or Ex-Partner: No    Emotionally Abused: No    Physically Abused: No    Sexually Abused: No   No current facility-administered medications on file prior to encounter.   Current Outpatient Medications on File Prior to Encounter  Medication Sig Dispense Refill   acetaminophen (TYLENOL) 500 MG tablet Take 500 mg by mouth every 6 (six) hours as needed for mild pain or headache.     ADDERALL XR 10 MG 24 hr capsule Take 10 mg by mouth every morning.     albuterol (VENTOLIN HFA) 108 (90 Base) MCG/ACT inhaler Inhale 2 puffs into the lungs every 6 (six) hours as needed for wheezing or shortness of breath. (Patient not taking: Reported on 06/01/2022)     cyclobenzaprine (FLEXERIL) 10 MG tablet Take 1 tablet (10 mg total) by mouth every 8 (eight) hours as needed for muscle spasms. (Patient not taking: Reported on 06/01/2022) 30 tablet 1   prenatal vitamin w/FE, FA (PRENATAL 1 + 1) 27-1 MG TABS tablet Take 1 tablet by mouth daily at 12 noon. (Patient not taking: Reported on 06/01/2022) 30 tablet 12   sertraline (ZOLOFT) 100 MG tablet Take 100 mg by mouth daily. (Patient not taking: Reported on 06/01/2022)     Allergies  Allergen Reactions   Latex Itching and Rash    I have reviewed patient's Past Medical Hx, Surgical Hx, Family Hx, Social Hx, medications and allergies.   ROS:  Review of Systems  Constitutional:  Negative for fever.  Gastrointestinal:  Positive for abdominal pain. Negative for diarrhea.  Genitourinary:  Positive for pelvic pain (mild cramping) and vaginal bleeding. Negative for  dysuria and frequency.   Review of Systems  Other systems negative   Physical Exam  Physical Exam Patient Vitals for the past 24 hrs:  BP Temp Temp src Pulse Resp SpO2 Weight  06/15/22 1333 (!) 142/79 98.7 F (37.1 C) Oral 83 14 100 % 77.7 kg   Constitutional: Well-developed, well-nourished female in no acute distress.  Cardiovascular: normal rate Respiratory: normal effort GI: Abd soft, non-tender. MS: Extremities nontender, no edema, normal ROM Neurologic: Alert and oriented x 4.  GU: Neg CVAT.  PELVIC EXAM: Deferred in lieu of transvaginal US  LAB RESULTS Results for orders placed or performed during the  hospital encounter of 06/15/22 (from the past 24 hour(s))  Urinalysis, Routine w reflex microscopic -Urine, Clean Catch     Status: Abnormal   Collection Time: 06/15/22  1:58 PM  Result Value Ref Range   Color, Urine YELLOW YELLOW   APPearance HAZY (A) CLEAR   Specific Gravity, Urine 1.009 1.005 - 1.030   pH 8.0 5.0 - 8.0   Glucose, UA NEGATIVE NEGATIVE mg/dL   Hgb urine dipstick MODERATE (A) NEGATIVE   Bilirubin Urine NEGATIVE NEGATIVE   Ketones, ur NEGATIVE NEGATIVE mg/dL   Protein, ur NEGATIVE NEGATIVE mg/dL   Nitrite NEGATIVE NEGATIVE   Leukocytes,Ua TRACE (A) NEGATIVE   RBC / HPF 0-5 0 - 5 RBC/hpf   WBC, UA 0-5 0 - 5 WBC/hpf   Bacteria, UA RARE (A) NONE SEEN   Squamous Epithelial / HPF 6-10 0 - 5 /HPF   Mucus PRESENT   Rh IG workup (includes ABO/Rh)     Status: None (Preliminary result)   Collection Time: 06/15/22  2:18 PM  Result Value Ref Range   Gestational Age(Wks) 7    ABO/RH(D) O NEG    Antibody Screen NEG    Unit Number [id number]    Blood Component Type RHIG    Unit division 00    Status of Unit ISSUED    Transfusion Status      OK TO TRANSFUSE Performed at [facility], [address], [place], [place] [address]   CBC     Status: Abnormal   Collection Time: 06/15/22  2:18 PM  Result Value Ref Range   WBC 11.2 (H) 4.0 - 10.5 K/uL    RBC 4.70 3.87 - 5.11 MIL/uL   Hemoglobin 13.3 12.0 - 15.0 g/dL   HCT 40.0 36.0 - 46.0 %   MCV 85.1 80.0 - 100.0 fL   MCH 28.3 26.0 - 34.0 pg   MCHC 33.3 30.0 - 36.0 g/dL   RDW 13.2 11.5 - 15.5 %   Platelets 321 150 - 400 K/uL   nRBC 0.0 0.0 - 0.2 %  Comprehensive metabolic panel     Status: Abnormal   Collection Time: 06/15/22  2:18 PM  Result Value Ref Range   Sodium 137 135 - 145 mmol/L   Potassium 3.5 3.5 - 5.1 mmol/L   Chloride 101 98 - 111 mmol/L   CO2 24 22 - 32 mmol/L   Glucose, Bld 100 (H) 70 - 99 mg/dL   BUN <5 (L) 6 - 20 mg/dL   Creatinine, Ser 0.60 0.44 - 1.00 mg/dL   Calcium 9.7 8.9 - 10.3 mg/dL   Total Protein 7.2 6.5 - 8.1 g/dL   Albumin 3.9 3.5 - 5.0 g/dL   AST 17 15 - 41 U/L   ALT 18 0 - 44 U/L   Alkaline Phosphatase 60 38 - 126 U/L   Total Bilirubin 0.1 (L) 0.3 - 1.2 mg/dL   GFR, Estimated >60 >60 mL/min   Anion gap 12 5 - 15  hCG, quantitative, pregnancy     Status: Abnormal   Collection Time: 06/15/22  2:18 PM  Result Value Ref Range   hCG, Beta Chain, Quant, S 110,317 (H) <5 mIU/mL  Wet prep, genital     Status: None   Collection Time: 06/15/22  2:30 PM  Result Value Ref Range   Yeast Wet Prep HPF POC NONE SEEN NONE SEEN   Trich, Wet Prep NONE SEEN NONE SEEN   Clue Cells Wet Prep HPF POC NONE SEEN NONE  SEEN   WBC, Wet Prep HPF POC <10 <10   Sperm NONE SEEN      IMAGING US OB LESS THAN 14 WEEKS WITH OB TRANSVAGINAL  Result Date: 06/15/2022 CLINICAL DATA:  Bleeding EXAM: OBSTETRIC <14 WK [place] AND TRANSVAGINAL OB US TECHNIQUE: Both transabdominal and transvaginal ultrasound examinations were performed for complete evaluation of the gestation as well as the maternal uterus, adnexal regions, and pelvic cul-de-sac. Transvaginal technique was performed to assess early pregnancy. COMPARISON:  None Available. FINDINGS: Intrauterine gestational sac: Single Yolk sac:  Visualized. Embryo:  Visualized. Cardiac Activity: Visualized. Heart Rate: 162 bpm CRL:  11 mm    7 w   1 d                  [place] EDC: 01/31/2023 Subchorionic hemorrhage:  None visualized. Maternal uterus/adnexae: Normal appearance of the bilateral ovaries. Left-sided corpus luteum cyst. Small volume free fluid seen in the pelvis. IMPRESSION: Single viable intrauterine pregnancy with estimated gestational age of [redacted] weeks 1 day Electronically Signed   By: Yetta Glassman M.D.   On: 06/15/2022 15:09    MAU Management/MDM: I have reviewed the triage vital signs and the nursing notes.   Pertinent labs & imaging results that were available during my care of the patient were reviewed by me and considered in my medical decision making (see chart for details).      I have reviewed her medical records including past results, notes and treatments. Medical, Surgical, and family history were reviewed.  Medications and recent lab tests were reviewed  Ordered usual first trimester r/o ectopic labs.   Pelvic cultures done Will check baseline Ultrasound to rule out ectopic.  This bleeding/pain can represent a normal pregnancy with bleeding, spontaneous abortion or even an ectopic which can be life-threatening.  The process as listed above helps to determine which of these is present.  Reviewed findings of live, single IUP at [redacted]w[redacted]d No [facility] identified, so discussed this may be implantation bleeding Discussed Blood Type of O Neg, need for Rhophylac (given) Has followup appt at [facility].  States Dr Elonda Husky plans to place cerclage  ASSESSMENT Single IUP at [redacted]w[redacted]d (confirmed by [place]) Bleeding in early pregnancy Pelvic cramping Pregnancy of unknown location, now confirmed intrauterine  PLAN Discharge home Conservative care with Pelvic Rest Followup in office as scheduled  Pt stable at time of discharge. Encouraged to return here if she develops worsening of symptoms, increase in pain, fever, or other concerning symptoms.    Hansel Feinstein CNM, MSN Certified Nurse-Midwife 06/15/2022  2:06 PM

## 2022-06-15 NOTE — MAU Note (Signed)
.  Audrey Matthews is a 22 y.o. at [redacted]w[redacted]d here in MAU reporting: lower abdominal cramping and spotting that started around 1130. She sees pink when she wipes and did pass a very small red blood clot (smaller than a pea). No recent IC.   Pain score: 5 Vitals:   06/15/22 1333  BP: (!) 142/79  Pulse: 83  Resp: 14  Temp: 98.7 F (37.1 C)  SpO2: 100%      Lab orders placed from triage:  UA

## 2022-06-16 LAB — RH IG WORKUP (INCLUDES ABO/RH)
ABO/RH(D): O NEG
Antibody Screen: NEGATIVE
Gestational Age(Wks): 7
Unit division: 0

## 2022-06-16 LAB — GC/CHLAMYDIA PROBE AMP (~~LOC~~) NOT AT ARMC
Chlamydia: NEGATIVE
Comment: NEGATIVE
Comment: NORMAL
Neisseria Gonorrhea: NEGATIVE

## 2022-06-17 ENCOUNTER — Other Ambulatory Visit: Payer: Self-pay | Admitting: *Deleted

## 2022-06-17 DIAGNOSIS — Z3682 Encounter for antenatal screening for nuchal translucency: Secondary | ICD-10-CM

## 2022-06-22 ENCOUNTER — Other Ambulatory Visit: Payer: Medicaid Other

## 2022-06-24 ENCOUNTER — Other Ambulatory Visit: Payer: Medicaid Other

## 2022-06-24 DIAGNOSIS — L299 Pruritus, unspecified: Secondary | ICD-10-CM

## 2022-06-25 LAB — BILE ACIDS, TOTAL: Bile Acids Total: 1.4 umol/L (ref 0.0–10.0)

## 2022-06-25 LAB — COMPREHENSIVE METABOLIC PANEL
ALT: 10 IU/L (ref 0–32)
AST: 10 IU/L (ref 0–40)
Albumin/Globulin Ratio: 1.7 (ref 1.2–2.2)
Albumin: 4 g/dL (ref 4.0–5.0)
Alkaline Phosphatase: 58 IU/L (ref 44–121)
BUN/Creatinine Ratio: 14 (ref 9–23)
BUN: 6 mg/dL (ref 6–20)
Bilirubin Total: 0.2 mg/dL (ref 0.0–1.2)
CO2: 17 mmol/L — ABNORMAL LOW (ref 20–29)
Calcium: 9.6 mg/dL (ref 8.7–10.2)
Chloride: 105 mmol/L (ref 96–106)
Creatinine, Ser: 0.44 mg/dL — ABNORMAL LOW (ref 0.57–1.00)
Globulin, Total: 2.4 g/dL (ref 1.5–4.5)
Glucose: 80 mg/dL (ref 70–99)
Potassium: 4 mmol/L (ref 3.5–5.2)
Sodium: 138 mmol/L (ref 134–144)
Total Protein: 6.4 g/dL (ref 6.0–8.5)
eGFR: 140 mL/min/{1.73_m2} (ref >59)

## 2022-06-30 ENCOUNTER — Other Ambulatory Visit: Payer: Self-pay | Admitting: Women's Health

## 2022-06-30 MED ORDER — FAMOTIDINE 20 MG PO TABS: 20.0000 mg | ORAL_TABLET | Freq: Two times a day (BID) | ORAL | 0 refills | Status: DC

## 2022-07-02 DIAGNOSIS — Z3490 Encounter for supervision of normal pregnancy, unspecified, unspecified trimester: Secondary | ICD-10-CM | POA: Insufficient documentation

## 2022-07-03 ENCOUNTER — Other Ambulatory Visit: Payer: Medicaid Other

## 2022-07-03 DIAGNOSIS — Z3480 Encounter for supervision of other normal pregnancy, unspecified trimester: Secondary | ICD-10-CM

## 2022-07-06 ENCOUNTER — Encounter: Payer: Self-pay | Admitting: Women's Health

## 2022-07-06 DIAGNOSIS — Z2839 Other underimmunization status: Secondary | ICD-10-CM | POA: Insufficient documentation

## 2022-07-06 DIAGNOSIS — O09899 Supervision of other high risk pregnancies, unspecified trimester: Secondary | ICD-10-CM | POA: Insufficient documentation

## 2022-07-08 LAB — CBC/D/PLT+RPR+RH+ABO+RUBIGG...
Basophils Absolute: 0 10*3/uL (ref 0.0–0.2)
Basos: 0 %
EOS (ABSOLUTE): 0.1 10*3/uL (ref 0.0–0.4)
Eos: 1 %
HCV Ab: NONREACTIVE
HIV Screen 4th Generation wRfx: NONREACTIVE
Hematocrit: 38.4 % (ref 34.0–46.6)
Hemoglobin: 13 g/dL (ref 11.1–15.9)
Hepatitis B Surface Ag: NEGATIVE
Immature Grans (Abs): 0 10*3/uL (ref 0.0–0.1)
Immature Granulocytes: 0 %
Lymphocytes Absolute: 2.1 10*3/uL (ref 0.7–3.1)
Lymphs: 26 %
MCH: 28.6 pg (ref 26.6–33.0)
MCHC: 33.9 g/dL (ref 31.5–35.7)
MCV: 85 fL (ref 79–97)
Monocytes Absolute: 0.6 10*3/uL (ref 0.1–0.9)
Monocytes: 8 %
Neutrophils Absolute: 5.4 10*3/uL (ref 1.4–7.0)
Neutrophils: 65 %
Platelets: 278 10*3/uL (ref 150–450)
RBC: 4.54 x10E6/uL (ref 3.77–5.28)
RDW: 13.1 % (ref 11.7–15.4)
RPR Ser Ql: NONREACTIVE
Rh Factor: NEGATIVE
Rubella Antibodies, IGG: 0.92 index — ABNORMAL LOW (ref >0.99)
WBC: 8.3 10*3/uL (ref 3.4–10.8)

## 2022-07-08 LAB — AB SCR+ANTIBODY ID: Antibody Screen: POSITIVE — AB

## 2022-07-08 LAB — HCV INTERPRETATION

## 2022-07-13 ENCOUNTER — Encounter: Payer: Self-pay | Admitting: Women's Health

## 2022-07-13 DIAGNOSIS — O26899 Other specified pregnancy related conditions, unspecified trimester: Secondary | ICD-10-CM | POA: Insufficient documentation

## 2022-07-13 DIAGNOSIS — Z6791 Unspecified blood type, Rh negative: Secondary | ICD-10-CM | POA: Insufficient documentation

## 2022-07-20 ENCOUNTER — Encounter: Payer: Self-pay | Admitting: Women's Health

## 2022-07-20 ENCOUNTER — Ambulatory Visit (INDEPENDENT_AMBULATORY_CARE_PROVIDER_SITE_OTHER): Payer: Medicaid Other

## 2022-07-20 ENCOUNTER — Ambulatory Visit (INDEPENDENT_AMBULATORY_CARE_PROVIDER_SITE_OTHER): Payer: Medicaid Other | Admitting: Women's Health

## 2022-07-20 ENCOUNTER — Telehealth: Payer: Self-pay | Admitting: Clinical

## 2022-07-20 ENCOUNTER — Encounter: Payer: Medicaid Other | Admitting: *Deleted

## 2022-07-20 VITALS — BP 112/74 | HR 58 | Wt 161.0 lb

## 2022-07-20 DIAGNOSIS — Z6791 Unspecified blood type, Rh negative: Secondary | ICD-10-CM

## 2022-07-20 DIAGNOSIS — F418 Other specified anxiety disorders: Secondary | ICD-10-CM | POA: Diagnosis not present

## 2022-07-20 DIAGNOSIS — Z8742 Personal history of other diseases of the female genital tract: Secondary | ICD-10-CM

## 2022-07-20 DIAGNOSIS — O26891 Other specified pregnancy related conditions, first trimester: Secondary | ICD-10-CM

## 2022-07-20 DIAGNOSIS — Z3A12 12 weeks gestation of pregnancy: Secondary | ICD-10-CM

## 2022-07-20 DIAGNOSIS — Z3682 Encounter for antenatal screening for nuchal translucency: Secondary | ICD-10-CM

## 2022-07-20 DIAGNOSIS — O09899 Supervision of other high risk pregnancies, unspecified trimester: Secondary | ICD-10-CM

## 2022-07-20 DIAGNOSIS — F909 Attention-deficit hyperactivity disorder, unspecified type: Secondary | ICD-10-CM | POA: Diagnosis not present

## 2022-07-20 DIAGNOSIS — Z3480 Encounter for supervision of other normal pregnancy, unspecified trimester: Secondary | ICD-10-CM

## 2022-07-20 DIAGNOSIS — O26899 Other specified pregnancy related conditions, unspecified trimester: Secondary | ICD-10-CM

## 2022-07-20 DIAGNOSIS — O09891 Supervision of other high risk pregnancies, first trimester: Secondary | ICD-10-CM

## 2022-07-20 DIAGNOSIS — Z3481 Encounter for supervision of other normal pregnancy, first trimester: Secondary | ICD-10-CM

## 2022-07-20 MED ORDER — DOXYLAMINE-PYRIDOXINE 10-10 MG PO TBEC: DELAYED_RELEASE_TABLET | ORAL | 6 refills | Status: DC

## 2022-07-20 MED ORDER — PROGESTERONE 200 MG PO CAPS: 200.0000 mg | ORAL_CAPSULE | Freq: Every day | ORAL | 5 refills | Status: DC

## 2022-07-20 NOTE — Patient Instructions (Addendum)
Francesca, thank you for choosing our office today! We appreciate the opportunity to meet your healthcare needs. You may receive a short survey by mail, e-mail, or through [employer]. If you are happy with your care we would appreciate if you could take just a few minutes to complete the survey questions. We read all of your comments and take your feedback very seriously. Thank you again for choosing our office.  [facility] for [employer] [facility] Team at [facility]  [facility] ([address] [place], [place] [address]) Entrance C, located off of [address] Free 24/7 valet parking   Nausea & Vomiting Have saltine crackers or pretzels by your bed and eat a few bites before you raise your head out of bed in the morning Eat small frequent meals throughout the day instead of large meals Drink plenty of fluids throughout the day to stay hydrated, just don't drink a lot of fluids with your meals.  This can make your stomach fill up faster making you feel sick Do not brush your teeth right after you eat Products with real ginger are good for nausea, like ginger ale and ginger hard candy Make sure it says made with real ginger! Sucking on sour candy like lemon heads is also good for nausea If your prenatal vitamins make you nauseated, take them at night so you will sleep through the nausea Sea Bands If you feel like you need medicine for the nausea & vomiting please let us know If you are unable to keep any fluids or food down please let us know   Constipation Drink plenty of fluid, preferably water, throughout the day Eat foods high in fiber such as fruits, vegetables, and grains Exercise, such as walking, is a good way to keep your bowels regular Drink warm fluids, especially warm prune juice, or decaf coffee Eat a 1/2 cup of real oatmeal (not instant), 1/2 cup applesauce, and 1/2-1 cup warm prune juice every day If needed, you may take Colace (docusate sodium) stool softener  once or twice a day to help keep the stool soft.  If you still are having problems with constipation, you may take Miralax once daily as needed to help keep your bowels regular.   Home Blood Pressure Monitoring for Patients   Your provider has recommended that you check your blood pressure (BP) at least once a week at home. If you do not have a blood pressure cuff at home, one will be provided for you. Contact your provider if you have not received your monitor within 1 week.   Helpful Tips for Accurate Home Blood Pressure Checks  Don't smoke, exercise, or drink caffeine 30 minutes before checking your BP Use the restroom before checking your BP (a full bladder can raise your pressure) Relax in a comfortable upright chair Feet on the ground Left arm resting comfortably on a flat surface at the level of your heart Legs uncrossed Back supported Sit quietly and don't talk Place the cuff on your bare arm Adjust snuggly, so that only two fingertips can fit between your skin and the top of the cuff Check 2 readings separated by at least one minute Keep a log of your BP readings For a visual, please reference this diagram: http://ccnc.care/bpdiagram  Provider Name: [facility]     Phone: [phone]  Zone 1: ALL CLEAR  Continue to monitor your symptoms:  BP reading is less than 140 (top number) or less than 90 (bottom  number)  No right upper stomach pain No headaches or seeing spots No feeling nauseated or throwing up No swelling in face and hands  Zone 2: CAUTION Call your doctor's office for any of the following:  BP reading is greater than 140 (top number) or greater than 90 (bottom number)  Stomach pain under your ribs in the middle or right side Headaches or seeing spots Feeling nauseated or throwing up Swelling in face and hands  Zone 3: EMERGENCY  Seek immediate medical care if you have any of the following:  BP reading is greater than160 (top number) or greater than  110 (bottom number) Severe headaches not improving with Tylenol Serious difficulty catching your breath Any worsening symptoms from Zone 2    First Trimester of Pregnancy The first trimester of pregnancy is from week 1 until the end of week 12 (months 1 through 3). A week after a sperm fertilizes an egg, the egg will implant on the wall of the uterus. This embryo will begin to develop into a baby. Genes from you and your partner are forming the baby. The female genes determine whether the baby is a boy or a girl. At 6-8 weeks, the eyes and face are formed, and the heartbeat can be seen on ultrasound. At the end of 12 weeks, all the baby's organs are formed.  Now that you are pregnant, you will want to do everything you can to have a healthy baby. Two of the most important things are to get good prenatal care and to follow your health care provider's instructions. Prenatal care is all the medical care you receive before the baby's birth. This care will help prevent, find, and treat any problems during the pregnancy and childbirth. BODY CHANGES Your body goes through many changes during pregnancy. The changes vary from woman to woman.  You may gain or lose a couple of pounds at first. You may feel sick to your stomach (nauseous) and throw up (vomit). If the vomiting is uncontrollable, call your health care provider. You may tire easily. You may develop headaches that can be relieved by medicines approved by your health care provider. You may urinate more often. Painful urination may mean you have a bladder infection. You may develop heartburn as a result of your pregnancy. You may develop constipation because certain hormones are causing the muscles that push waste through your intestines to slow down. You may develop hemorrhoids or swollen, bulging veins (varicose veins). Your breasts may begin to grow larger and become tender. Your nipples may stick out more, and the tissue that surrounds them  (areola) may become darker. Your gums may bleed and may be sensitive to brushing and flossing. Dark spots or blotches (chloasma, mask of pregnancy) may develop on your face. This will likely fade after the baby is born. Your menstrual periods will stop. You may have a loss of appetite. You may develop cravings for certain kinds of food. You may have changes in your emotions from day to day, such as being excited to be pregnant or being concerned that something may go wrong with the pregnancy and baby. You may have more vivid and strange dreams. You may have changes in your hair. These can include thickening of your hair, rapid growth, and changes in texture. Some women also have hair loss during or after pregnancy, or hair that feels dry or thin. Your hair will most likely return to normal after your baby is born. WHAT TO EXPECT AT YOUR PRENATAL  VISITS During a routine prenatal visit: You will be weighed to make sure you and the baby are growing normally. Your blood pressure will be taken. Your abdomen will be measured to track your baby's growth. The fetal heartbeat will be listened to starting around week 10 or 12 of your pregnancy. Test results from any previous visits will be discussed. Your health care provider may ask you: How you are feeling. If you are feeling the baby move. If you have had any abnormal symptoms, such as leaking fluid, bleeding, severe headaches, or abdominal cramping. If you have any questions. Other tests that may be performed during your first trimester include: Blood tests to find your blood type and to check for the presence of any previous infections. They will also be used to check for low iron levels (anemia) and Rh antibodies. Later in the pregnancy, blood tests for diabetes will be done along with other tests if problems develop. Urine tests to check for infections, diabetes, or protein in the urine. An ultrasound to confirm the proper growth and development  of the baby. An amniocentesis to check for possible genetic problems. Fetal screens for spina bifida and Down syndrome. You may need other tests to make sure you and the baby are doing well. HOME CARE INSTRUCTIONS  Medicines Follow your health care provider's instructions regarding medicine use. Specific medicines may be either safe or unsafe to take during pregnancy. Take your prenatal vitamins as directed. If you develop constipation, try taking a stool softener if your health care provider approves. Diet Eat regular, well-balanced meals. Choose a variety of foods, such as meat or vegetable-based protein, fish, milk and low-fat dairy products, vegetables, fruits, and whole grain breads and cereals. Your health care provider will help you determine the amount of weight gain that is right for you. Avoid raw meat and uncooked cheese. These carry germs that can cause birth defects in the baby. Eating four or five small meals rather than three large meals a day may help relieve nausea and vomiting. If you start to feel nauseous, eating a few soda crackers can be helpful. Drinking liquids between meals instead of during meals also seems to help nausea and vomiting. If you develop constipation, eat more high-fiber foods, such as fresh vegetables or fruit and whole grains. Drink enough fluids to keep your urine clear or pale yellow. Activity and Exercise Exercise only as directed by your health care provider. Exercising will help you: Control your weight. Stay in shape. Be prepared for labor and delivery. Experiencing pain or cramping in the lower abdomen or low back is a good sign that you should stop exercising. Check with your health care provider before continuing normal exercises. Try to avoid standing for long periods of time. Move your legs often if you must stand in one place for a long time. Avoid heavy lifting. Wear low-heeled shoes, and practice good posture. You may continue to have sex  unless your health care provider directs you otherwise. Relief of Pain or Discomfort Wear a good support bra for breast tenderness.   Take warm sitz baths to soothe any pain or discomfort caused by hemorrhoids. Use hemorrhoid cream if your health care provider approves.   Rest with your legs elevated if you have leg cramps or low back pain. If you develop varicose veins in your legs, wear support hose. Elevate your feet for 15 minutes, 3-4 times a day. Limit salt in your diet. Prenatal Care Schedule your prenatal visits by the  twelfth week of pregnancy. They are usually scheduled monthly at first, then more often in the last 2 months before delivery. Write down your questions. Take them to your prenatal visits. Keep all your prenatal visits as directed by your health care provider. Safety Wear your seat belt at all times when driving. Make a list of emergency phone numbers, including numbers for family, friends, the hospital, and police and fire departments. General Tips Ask your health care provider for a referral to a local prenatal education class. Begin classes no later than at the beginning of month 6 of your pregnancy. Ask for help if you have counseling or nutritional needs during pregnancy. Your health care provider can offer advice or refer you to specialists for help with various needs. Do not use hot tubs, steam rooms, or saunas. Do not douche or use tampons or scented sanitary pads. Do not cross your legs for long periods of time. Avoid cat litter boxes and soil used by cats. These carry germs that can cause birth defects in the baby and possibly loss of the fetus by miscarriage or stillbirth. Avoid all smoking, herbs, alcohol, and medicines not prescribed by your health care provider. Chemicals in these affect the formation and growth of the baby. Schedule a dentist appointment. At home, brush your teeth with a soft toothbrush and be gentle when you floss. SEEK MEDICAL CARE IF:   You have dizziness. You have mild pelvic cramps, pelvic pressure, or nagging pain in the abdominal area. You have persistent nausea, vomiting, or diarrhea. You have a bad smelling vaginal discharge. You have pain with urination. You notice increased swelling in your face, hands, legs, or ankles. SEEK IMMEDIATE MEDICAL CARE IF:  You have a fever. You are leaking fluid from your vagina. You have spotting or bleeding from your vagina. You have severe abdominal cramping or pain. You have rapid weight gain or loss. You vomit blood or material that looks like coffee grounds. You are exposed to [place] measles and have never had them. You are exposed to fifth disease or chickenpox. You develop a severe headache. You have shortness of breath. You have any kind of trauma, such as from a fall or a car accident. Document Released: 03/03/2001 Document Revised: 07/24/2013 Document Reviewed: 01/17/2013 [facility] Patient Information 2015 [place], [place]. This information is not intended to replace advice given to you by your health care provider. Make sure you discuss any questions you have with your health care provider.   For Headaches:  Stay well hydrated, drink enough water so that your urine is clear, sometimes if you are dehydrated you can get headaches Eat small frequent meals and snacks, sometimes if you are hungry you can get headaches Sometimes you get headaches during pregnancy from the pregnancy hormones You can try tylenol (1-2 regular strength 325mg  or 1-2 extra strength 500mg ) as directed on the box. The least amount of medication that works is best.  Cool compresses (cool wet washcloth or ice pack) to area of head that is hurting You can also try drinking a caffeinated drink to see if this will help If not helping, try below:  For Prevention of Headaches/Migraines: CoQ10 100mg  three times daily Vitamin B2 400mg  daily Magnesium Oxide 400-600mg  daily  Foods to alleviate migraines:   1) dark leafy greens 2) avocado 3) tuna 4) salmon  5) beans and legumes  Foods to avoid: 1) Excessive (or irregular timing) coffee 3) aged cheeses 4) chocolate 5) citrus fruits 6) aspartame and other artifical  sweeteners 7) yeast 8) MSG (in processed foods) 9) processed and cured meats 10) nuts and certain seeds 11) chicken livers and other organ meats 12) dairy products like buttermilk, sour cream, and yogurt 13) dried fruits like dates, figs, and raisins 14) garlic 15) onions 16) potato chips 17) pickled foods like olives and sauerkraut 18) some fresh fruits like ripe banana, papaya, red plums, raspberries, kiwi, pineapple 19) tomato-based products  Recommend to keep a migraine diary: rate daily the severity of your headache (1-10) and what foods you eat that day to help determine patterns.   If You Get a Bad Headache/Migraine: Benadryl 25mg   Magnesium Oxide 1 large Gatorade 2 extra strength Tylenol (1,000mg  total) 1 cup coffee or Coke      If this doesn't help please call @ [phone]

## 2022-07-20 NOTE — Progress Notes (Signed)
INITIAL OBSTETRICAL VISIT Patient name: Audrey Matthews MRN [id number]  Date of birth: 2000/11/18 Chief Complaint:   Initial Prenatal Visit (Headaches, nausea, still itching)  History of Present Illness:   Audrey Matthews is a 22 y.o. [phone] Caucasian female at [redacted]w[redacted]d by LMP c/w u/s at 7 weeks with an Estimated Date of Delivery: 01/31/23 being seen today for her initial obstetrical visit.   Patient's last menstrual period was 04/26/2022. Her obstetrical history is significant for  28.1wk PTB after rescue cerclage @ 21wks for no measurable cx, then PPROM and rapid active labor, SAB x 1 .   Today she reports  has had generalized itching that started in January, then sporadic rashes that pop up. Has been sending MyChart messages w/ pics. Has also been seeing PCP [facility]. Has tried multiple OTC creams hydrocortisone, benadryl, none have helped . One pic appeared to be hives, so rx'd pepcid, she said this has helped. Currently only has 1 spot on finger. States PCP thinks it could be her water- [place] to city [place]- sent referral to allergy specialist- hasn't made appt yet but plans to. No one else in home has this Is having nausea, some vomiting and headaches. Dep/anx, ADHD, PTSD, BPD- on adderall prior to pregnancy, quit w/ +PT, feels she is doing well off meds. Some anxiety, offered IBH referral-wants.  Last pap 11/05/20. Results were: NILM w/ HRHPV not done     07/20/2022   10:01 AM 11/05/2020    1:43 PM 02/09/2019    9:50 AM 12/27/2018    1:48 PM  Depression screen PHQ 2/9  Decreased Interest 0 1 0 1  Down, Depressed, Hopeless 0 1 0 1  PHQ - 2 Score 0 2 0 2  Altered sleeping 1 1 1  0  Tired, decreased energy 1 1 1 1   Change in appetite 2 0 0 2  Feeling bad or failure about yourself  0 1 0 1  Trouble concentrating 1 1 2  0  Moving slowly or fidgety/restless 0 0 3 1  Suicidal thoughts 0 0 0 0  PHQ-9 Score 5 6 7 7   Difficult doing work/chores   Somewhat difficult         07/20/2022    10:01 AM 11/05/2020    1:43 PM  GAD 7 : Generalized Anxiety Score  Nervous, Anxious, on Edge 1 1  Control/stop worrying 2 1  Worry too much - different things 1 1  Trouble relaxing 1 1  Restless 0 1  Easily annoyed or irritable 2 2  Afraid - awful might happen 1 1  Total GAD 7 Score 8 8     Review of Systems:   Pertinent items are noted in HPI Denies cramping/contractions, leakage of fluid, vaginal bleeding, abnormal vaginal discharge w/ itching/odor/irritation, headaches, visual changes, shortness of breath, chest pain, abdominal pain, severe nausea/vomiting, or problems with urination or bowel movements unless otherwise stated above.  Pertinent History Reviewed:  Reviewed past medical,surgical, social, obstetrical and family history.  Reviewed problem list, medications and allergies. OB History  Gravida Para Term Preterm AB Living  3 1   1 1 1   SAB IAB Ectopic Multiple Live Births  1     0 1    # Outcome Date GA Lbr Len/2nd Weight Sex Delivery Anes PTL Lv  3 Current           2 SAB 06/2021          1 Preterm 06/01/19 [redacted]w[redacted]d 01:38 / 00:06 2  lb 7.2 oz (1.11 kg) F Vag-Spont None  LIV     Complications: Short cervix with cervical cerclage in second trimester, antepartum, Preterm premature rupture of membranes   Physical Assessment:   Vitals:   07/20/22 1037  BP: 112/74  Pulse: (!) 58  Weight: 161 lb (73 kg)  Body mass index is 30.42 kg/m.       Physical Examination:  General appearance - well appearing, and in no distress  Mental status - alert, oriented to person, place, and time  Psych:  She has a normal mood and affect  Skin - warm and dry, normal color, no suspicious lesions noted  Chest - effort normal, all lung fields clear to auscultation bilaterally  Heart - normal rate and regular rhythm  Abdomen - soft, nontender  Extremities:  No swelling or varicosities noted, small red bump on finger Rt hand  Thin prep pap is not done  Chaperone: N/A    TODAY'S NT [place]  12+1 WKS,measurements c/w dates,NB present,NT 1.7 mm,normal ovaries,anterior placenta,cx length 3.6 cm,CRL 55.95 mm   No results found for this or any previous visit (from the past 24 hour(s)).  Assessment & Plan:  1) Low-Risk Pregnancy [id number] at [redacted]w[redacted]d with an Estimated Date of Delivery: 01/31/23   2) Initial OB visit  3) H/O 28wk PTB d/t PPROM/PTL 7wks after rescue cerclage for no measurable cx> rx prometrium pv qhs, CL q2wks until 26wks w/ likely prophylactic cerclage at 14+wks  4) Dep/anx, ADHD, BPD, PTSD> quit adderall w/ +PT, feels she's doing well, IBH referral ordered  5) N/V> rx diclegis  6) Headaches> gave printed prevention/relief measures   7) Generalized pruritus w/ intermittent rash/hives> better w/ pepcid, but still not gone, PCP has referred to allergy specialist, to make appt. Offered vistaril to have for itching and anxiety-declines at this time  Meds:  Meds ordered this encounter  Medications   progesterone (PROMETRIUM) 200 MG capsule    Sig: Place 1 capsule (200 mg total) vaginally at bedtime.    Dispense:  30 capsule    Refill:  5   Doxylamine-Pyridoxine (DICLEGIS) 10-10 MG TBEC    Sig: 2 tabs q hs, if sx persist add 1 tab q am on day 3, if sx persist add 1 tab q afternoon on day 4    Dispense:  100 tablet    Refill:  6    Initial labs obtained Continue prenatal vitamins Reviewed n/v relief measures and warning s/s to report Reviewed recommended weight gain based on pre-gravid BMI Encouraged well-balanced diet Genetic & carrier screening discussed: requests Panorama and NT/IT, neg Horizon prev preg Ultrasound discussed; fetal survey: requested CCNC completed> form faxed if has or is planning to apply for medicaid The nature of [place] [facility] [employer] with multiple MDs and other Advanced Practice Providers was explained to patient; also emphasized that fellows, residents, and students are part of our team. Does have home bp cuff. Office  bp cuff given: no. Rx sent: no. Check bp weekly, let us know if consistently >140/90.   Follow-up: Return in about 2 weeks (around 08/03/2022) for CL u/s then visit w/ MD only; CL q 2wks w/ MD visit until 26wks.   Orders Placed This Encounter  Procedures   Urine Culture   GC/Chlamydia Probe Amp   US OB Transvaginal   US OB Limited   Integrated 1   PANORAMA PRENATAL TEST FULL PANEL   Amb ref to [facility]    [place]  Mady Gemma CNM, [facility] 07/20/2022 12:53 PM

## 2022-07-20 NOTE — Telephone Encounter (Signed)
Attempt call regarding referral; Left HIPPA-compliant message to call back  from [facility] at  [phone] ('s office).   

## 2022-07-20 NOTE — Progress Notes (Signed)
[place] 12+1 WKS,measurements c/w dates,NB present,NT 1.7 mm,normal ovaries,anterior placenta,cx length 3.6 cm,CRL 55.95 mm

## 2022-07-22 ENCOUNTER — Other Ambulatory Visit: Payer: Self-pay | Admitting: Obstetrics & Gynecology

## 2022-07-22 DIAGNOSIS — O09299 Supervision of pregnancy with other poor reproductive or obstetric history, unspecified trimester: Secondary | ICD-10-CM

## 2022-07-22 DIAGNOSIS — Z8759 Personal history of other complications of pregnancy, childbirth and the puerperium: Secondary | ICD-10-CM

## 2022-07-22 LAB — URINE CULTURE

## 2022-07-22 LAB — GC/CHLAMYDIA PROBE AMP
Chlamydia trachomatis, NAA: NEGATIVE
Neisseria Gonorrhoeae by PCR: NEGATIVE

## 2022-07-22 NOTE — Progress Notes (Signed)
Orders placed.

## 2022-07-23 LAB — INTEGRATED 1
Crown Rump Length: 56 mm
Gest. Age on Collection Date: 12 weeks
Maternal Age at EDD: 23.4 yr
Nuchal Translucency (NT): 1.7 mm
Number of Fetuses: 1
PAPP-A Value: 1370.2 ng/mL
Weight: 161 [lb_av]

## 2022-07-26 ENCOUNTER — Other Ambulatory Visit: Payer: Self-pay

## 2022-07-26 ENCOUNTER — Emergency Department (HOSPITAL_COMMUNITY)
Admission: EM | Admit: 2022-07-26 | Discharge: 2022-07-27 | Disposition: A | Discharge status: Home / Self Care | Payer: Medicaid Other | Attending: Emergency Medicine | Admitting: Emergency Medicine

## 2022-07-26 DIAGNOSIS — O209 Hemorrhage in early pregnancy, unspecified: Secondary | ICD-10-CM | POA: Diagnosis not present

## 2022-07-26 DIAGNOSIS — Z91040 Latex allergy status: Secondary | ICD-10-CM | POA: Insufficient documentation

## 2022-07-26 DIAGNOSIS — R1084 Generalized abdominal pain: Secondary | ICD-10-CM | POA: Diagnosis not present

## 2022-07-26 DIAGNOSIS — Z3A13 13 weeks gestation of pregnancy: Secondary | ICD-10-CM | POA: Insufficient documentation

## 2022-07-26 DIAGNOSIS — O4690 Antepartum hemorrhage, unspecified, unspecified trimester: Secondary | ICD-10-CM

## 2022-07-26 LAB — CBC
HCT: 36 % (ref 36.0–46.0)
Hemoglobin: 12.3 g/dL (ref 12.0–15.0)
MCH: 29 pg (ref 26.0–34.0)
MCHC: 34.2 g/dL (ref 30.0–36.0)
MCV: 84.9 fL (ref 80.0–100.0)
Platelets: 264 10*3/uL (ref 150–400)
RBC: 4.24 MIL/uL (ref 3.87–5.11)
RDW: 13.8 % (ref 11.5–15.5)
WBC: 11.5 10*3/uL — ABNORMAL HIGH (ref 4.0–10.5)
nRBC: 0 % (ref 0.0–0.2)

## 2022-07-26 LAB — HCG, QUANTITATIVE, PREGNANCY: hCG, Beta Chain, Quant, S: 82167 m[IU]/mL — ABNORMAL HIGH (ref <5)

## 2022-07-26 LAB — POC URINE PREG, ED: Preg Test, Ur: POSITIVE — AB

## 2022-07-26 MED ORDER — ACETAMINOPHEN 325 MG PO TABS
650.0000 mg | ORAL_TABLET | Freq: Once | ORAL | Status: AC
  Administered 2022-07-26: 650 mg via ORAL
  Filled 2022-07-26: qty 2

## 2022-07-26 MED ORDER — ONDANSETRON 4 MG PO TBDP
4.0000 mg | ORAL_TABLET | Freq: Once | ORAL | Status: AC
  Administered 2022-07-26: 4 mg via ORAL
  Filled 2022-07-26: qty 1

## 2022-07-26 NOTE — ED Notes (Signed)
This RN called lab to follow up on hcg quantitiative. Per lab they have received it. Wardell Heath Gerrianne Scale

## 2022-07-26 NOTE — ED Triage Notes (Addendum)
Pt reports she is [redacted] weeks pregnant, ultrasound confirmed. Started having abdominal cramps tonight along with vaginal bleeding. Concerned she is having a miscarriage, had previous miscarriage April of 2023.    Hx of Cervical incompetence

## 2022-07-26 NOTE — ED Provider Notes (Signed)
[place] EMERGENCY DEPARTMENT AT [facility] Provider Note   CSN: [id number] Arrival date & time: 07/26/22  2124     History {Add pertinent medical, surgical, social history, OB history to HPI:1} Chief Complaint  Patient presents with   Threatened Miscarriage    Audrey Matthews is a 22 y.o. female.  The history is provided by the patient and the spouse.  Patient is G3, P1 at approximately 13 weeks who presents with vaginal spotting.  Patient reports she was doing housework when she lifted something and she felt a rip and started having light vaginal bleeding.  She is now having diffuse abdominal cramping.  No fevers.  She reports previous history of miscarriage.  Reports a history of cervical incompetence She has daily vomiting with her pregnancy that is unchanged     Home Medications Prior to Admission medications   Medication Sig Start Date End Date Taking? Authorizing Provider  acetaminophen (TYLENOL) 500 MG tablet Take 500 mg by mouth every 6 (six) hours as needed for mild pain or headache.    [provider]  ADDERALL XR 10 MG 24 hr capsule Take 10 mg by mouth every morning. 03/07/20   [provider]  albuterol (VENTOLIN HFA) 108 (90 Base) MCG/ACT inhaler Inhale 2 puffs into the lungs every 6 (six) hours as needed for wheezing or shortness of breath. Patient not taking: Reported on 06/01/2022 12/20/18   [provider]  Doxylamine-Pyridoxine (DICLEGIS) 10-10 MG TBEC 2 tabs q hs, if sx persist add 1 tab q am on day 3, if sx persist add 1 tab q afternoon on day 4 07/20/22   Cheral Marker, CNM  famotidine (PEPCID) 20 MG tablet Take 1 tablet (20 mg total) by mouth 2 (two) times daily. 06/30/22   Cheral Marker, CNM  prenatal vitamin w/FE, FA (PRENATAL 1 + 1) 27-1 MG TABS tablet Take 1 tablet by mouth daily at 12 noon. 02/06/22   Adline Potter, NP  progesterone (PROMETRIUM) 200 MG capsule Place 1 capsule (200 mg total) vaginally at  bedtime. 07/20/22   Cheral Marker, CNM  sertraline (ZOLOFT) 100 MG tablet Take 100 mg by mouth daily. Patient not taking: Reported on 06/01/2022 09/03/20   [provider]      Allergies    Latex    Review of Systems   Review of Systems  Constitutional:  Negative for fever.  Genitourinary:  Positive for vaginal bleeding.    Physical Exam Updated Vital Signs BP 119/74   Pulse 70   Temp 98.3 F (36.8 C) (Oral)   Resp (!) 22   Ht 1.562 m (5' 1.5")   Wt 73 kg   LMP 04/26/2022   SpO2 100%   BMI 29.93 kg/m  Physical Exam CONSTITUTIONAL: Well developed/well nourished, anxious HEAD: Normocephalic/atraumatic EYES: EOMI/PERRL ENMT: Mucous membranes moist NECK: supple no meningeal signs CV: S1/S2 noted, no murmurs/rubs/gallops noted LUNGS: Lungs are clear to auscultation bilaterally, no apparent distress ABDOMEN: soft, mild diffuse tenderness GU:no cva tenderness *** NEURO: Pt is awake/alert/appropriate, moves all extremitiesx4.  No facial droop.   SKIN: warm, color normal PSYCH: Anxious  ED Results / Procedures / Treatments   Labs (all labs ordered are listed, but only abnormal results are displayed) Labs Reviewed  CBC - Abnormal; Notable for the following components:      Result Value   WBC 11.5 (*)    All other components within normal limits  HCG, QUANTITATIVE, PREGNANCY - Abnormal; Notable for the  following components:   hCG, Beta Chain, Quant, S 82,167 (*)    All other components within normal limits  POC URINE PREG, ED - Abnormal; Notable for the following components:   Preg Test, Ur Positive (*)    All other components within normal limits    EKG None  Radiology No results found.  Procedures Procedures  {Document cardiac monitor, telemetry assessment procedure when appropriate:1}  Medications Ordered in ED Medications  acetaminophen (TYLENOL) tablet 650 mg (650 mg Oral Given 07/26/22 2345)  ondansetron (ZOFRAN-ODT) disintegrating tablet 4  mg (4 mg Oral Given 07/26/22 2345)    ED Course/ Medical Decision Making/ A&P Clinical Course as of 07/26/22 2356  Sun Jul 26, 2022  2352 WBC(!): 11.5 Mild leukocytosis [DW]  2356 Patient is approximately 13 weeks.  She is already had ultrasound confirmed a single IUP.  She reports she received RhoGAM around 7 weeks as she is O-.  This would not need to be repeated for now [DW]    Clinical Course User Index [DW] Zadie Rhine, MD   {   Click here for ABCD2, HEART and other calculatorsREFRESH Note before signing :1}                          Medical Decision Making Amount and/or Complexity of Data Reviewed Labs: ordered. Decision-making details documented in ED Course.  Risk OTC drugs. Prescription drug management.   Patient presents for abdominal cramping and vaginal bleeding in the setting of pregnancy.  Differential includes miscarriage, ectopic pregnancy.  Patient already has a known single IUP. Defer imaging at this time as this can be done in the outpatient setting.  {Document critical care time when appropriate:1} {Document review of labs and clinical decision tools ie heart score, Chads2Vasc2 etc:1}  {Document your independent review of radiology images, and any outside records:1} {Document your discussion with family members, caretakers, and with consultants:1} {Document social determinants of health affecting pt's care:1} {Document your decision making why or why not admission, treatments were needed:1} Final Clinical Impression(s) / ED Diagnoses Final diagnoses:  None    Rx / DC Orders ED Discharge Orders     None

## 2022-07-26 NOTE — ED Notes (Signed)
Upon this RN assessment, patient c/o lower abdominal cramping that she rates 7/10. She denies vaginal bleeding currently. Per patient earlier today she was cleaning her daughters room and bent down to pick something up. She lifted object and felt a "ripping" in her lower abdomen and shortly after had light vaginal bleeding that she describes as pink in color. Pt reports pain has progressively worsened. She has had 3 pregnancies total. First pregnancy was delivered at 28 weeks, second miscarried at 12 weeks, and reports this pregnancy she has had bleeding one other time. Kellogg RN

## 2022-07-27 ENCOUNTER — Telehealth: Payer: Self-pay | Admitting: *Deleted

## 2022-07-27 LAB — URINALYSIS, ROUTINE W REFLEX MICROSCOPIC
Bilirubin Urine: NEGATIVE
Glucose, UA: NEGATIVE mg/dL
Hgb urine dipstick: NEGATIVE
Ketones, ur: 80 mg/dL — AB
Nitrite: NEGATIVE
Protein, ur: 30 mg/dL — AB
Specific Gravity, Urine: 1.032 — ABNORMAL HIGH (ref 1.005–1.030)
pH: 5 (ref 5.0–8.0)

## 2022-07-27 NOTE — Telephone Encounter (Signed)
Pt is needing an order for her HCG levels per [facility] ER. Pt was told to go 07/28/2022 am.

## 2022-07-27 NOTE — ED Notes (Signed)
This RN had difficulty obtaining fetal heart tones. Second RN attempted as well. Very briefly a rate of 150 was obtained. Dr Bebe Shaggy was notified. Kellogg RN

## 2022-07-27 NOTE — Telephone Encounter (Signed)
Returned patient's call.  States she was cleaning her daughter's room yesterday and moved a few things.  Felt a weird sensation in her abdomen and went to the bathroom and noticed some light pink spotting.  States the bleeding has resolved but is now cramping.  Discussed with Dr Despina Hidden and no additional testing is needed at this time.  Advised no lifting, pulling pushing during pregnancy and to push fluids and rest today.  If bleeding returns and cramping does not resolve to let us know or [facility].  Pt verbalized understanding with no further questions.

## 2022-07-29 LAB — PANORAMA PRENATAL TEST FULL PANEL:PANORAMA TEST PLUS 5 ADDITIONAL MICRODELETIONS: FETAL FRACTION: 10.4

## 2022-07-31 NOTE — BH Specialist Note (Signed)
Integrated Behavioral Health via Telemedicine Visit  08/10/2022 Audrey Matthews [id number]  Number of Integrated Behavioral Health Clinician visits: 1- Initial Visit  Session Start time: 1317   Session End time: 1409  Total time in minutes: 52   Referring Provider: Shawna Clamp, CNM [name]     Types of Service: Individual psychotherapy and Video visit  I connected with Audrey Matthews and/or Audrey Matthews's  n/a  via  Telephone or Video Enabled Telemedicine Application  (Video is Caregility application) and verified that I am speaking with the correct person using two identifiers. Discussed confidentiality: Yes   I discussed the limitations of telemedicine and the availability of in person appointments.  Discussed there is a possibility of technology failure and discussed alternative modes of communication if that failure occurs.  I discussed that engaging in this telemedicine visit, they consent to the provision of behavioral healthcare and the services will be billed under their insurance.  Patient and/or legal guardian expressed understanding and consented to Telemedicine visit: Yes   Presenting Concerns: Patient and/or family reports the following symptoms/concerns: Pt mood improved after stopping Zoloft between 2-3 weeks ago; ongoing worry about the unknowns in pregnancy/labor/postpartum, as well as worry about 3yo daughter and increased anxiety in stores and/or large groups. Pt copes using noise-cancelling headphones and stimming; open to implement additional self-coping strategy today. Pt's goal is to prevent repeat of last stressful postpartum time (daughter in NICU,husband's job loss, eviction, car troubles, etc.). Pt has good support via husband/family/in-laws. Duration of problem:  Ongoing with increase current pregnancy; Severity of problem: moderate  Patient and/or Family's Strengths/Protective Factors: Social connections, Concrete supports in place (healthy food, safe environments, etc.), Sense of purpose, and Physical Health (exercise, healthy diet, medication compliance, etc.)  Goals Addressed: Patient will:  Maintain reduction of symptoms of: anxiety, depression, and stress   Increase knowledge and/or ability of: self-management skills   Demonstrate ability to: Increase healthy adjustment to current life circumstances and Increase motivation to adhere to plan of care  Progress towards Goals: Ongoing  Interventions: Interventions utilized:  Mindfulness or Management consultant and Psychoeducation and/or Health Education Standardized Assessments completed: Not Needed  Patient and/or Family Response: Patient agrees with treatment plan.   Assessment: Patient currently experiencing Autism spectrum disorder, Attention-deficit-hyperactivity disorder, Post-traumatic stress disorder (all as previously diagnosed).   Patient may benefit from psychoeducation and brief therapeutic interventions regarding coping with symptoms of anxiety, depression .  Plan: Follow up with behavioral health clinician on : Two weeks Behavioral recommendations:  -Continue taking prenatal vitamin as prescribed -Continue using self-coping strategies that have helped in the past -CALM relaxation breathing exercise twice daily (morning; at bedtime with sleep sounds); as needed throughout the day. -Read through After Visit Summary; use as needed and discussed Referral(s): Integrated [employer] (In Clinic)  I discussed the assessment and treatment plan with the patient and/or parent/guardian. They were provided an opportunity to ask questions and all were answered. They agreed with the plan and demonstrated an understanding of the instructions.   They were advised to call  back or seek an in-person evaluation if the symptoms worsen or if the condition fails to improve as anticipated.  [name] , LCSW     07/20/2022   10:01 AM 11/05/2020    1:43 PM 02/09/2019    9:50 AM 12/27/2018    1:48  PM  Depression screen PHQ 2/9  Decreased Interest 0 1 0 1  Down, Depressed, Hopeless 0 1 0 1  PHQ - 2 Score 0 2 0 2  Altered sleeping 1 1 1  0  Tired, decreased energy 1 1 1 1   Change in appetite 2 0 0 2  Feeling bad or failure about yourself  0 1 0 1  Trouble concentrating 1 1 2  0  Moving slowly or fidgety/restless 0 0 3 1  Suicidal thoughts 0 0 0 0  PHQ-9 Score 5 6 7 7   Difficult doing work/chores   Somewhat difficult       07/20/2022   10:01 AM 11/05/2020    1:43 PM  GAD 7 : Generalized Anxiety Score  Nervous, Anxious, on Edge 1 1  Control/stop worrying 2 1  Worry too much - different things 1 1  Trouble relaxing 1 1  Restless 0 1  Easily annoyed or irritable 2 2  Afraid - awful might happen 1 1  Total GAD 7 Score 8 8

## 2022-08-04 ENCOUNTER — Encounter: Payer: Self-pay | Admitting: Obstetrics & Gynecology

## 2022-08-04 ENCOUNTER — Ambulatory Visit (INDEPENDENT_AMBULATORY_CARE_PROVIDER_SITE_OTHER): Payer: Medicaid Other | Admitting: Obstetrics & Gynecology

## 2022-08-04 ENCOUNTER — Ambulatory Visit (INDEPENDENT_AMBULATORY_CARE_PROVIDER_SITE_OTHER): Payer: Medicaid Other

## 2022-08-04 VITALS — BP 117/80 | HR 83 | Wt 162.0 lb

## 2022-08-04 DIAGNOSIS — Z3A14 14 weeks gestation of pregnancy: Secondary | ICD-10-CM | POA: Diagnosis not present

## 2022-08-04 DIAGNOSIS — Z6791 Unspecified blood type, Rh negative: Secondary | ICD-10-CM

## 2022-08-04 DIAGNOSIS — O0992 Supervision of high risk pregnancy, unspecified, second trimester: Secondary | ICD-10-CM

## 2022-08-04 DIAGNOSIS — Z9889 Other specified postprocedural states: Secondary | ICD-10-CM

## 2022-08-04 DIAGNOSIS — Z8742 Personal history of other diseases of the female genital tract: Secondary | ICD-10-CM

## 2022-08-04 DIAGNOSIS — Z3481 Encounter for supervision of other normal pregnancy, first trimester: Secondary | ICD-10-CM

## 2022-08-04 DIAGNOSIS — O09892 Supervision of other high risk pregnancies, second trimester: Secondary | ICD-10-CM

## 2022-08-04 DIAGNOSIS — O09292 Supervision of pregnancy with other poor reproductive or obstetric history, second trimester: Secondary | ICD-10-CM

## 2022-08-04 DIAGNOSIS — O09299 Supervision of pregnancy with other poor reproductive or obstetric history, unspecified trimester: Secondary | ICD-10-CM

## 2022-08-04 DIAGNOSIS — Z3480 Encounter for supervision of other normal pregnancy, unspecified trimester: Secondary | ICD-10-CM

## 2022-08-04 DIAGNOSIS — O09899 Supervision of other high risk pregnancies, unspecified trimester: Secondary | ICD-10-CM

## 2022-08-04 NOTE — Progress Notes (Signed)
[place] TA/TV: 14+2 wks,cx length with and without pressure 3.5-3.8 cm,anterior placenta gr 0,normal ovaries,FHR 147 bpm,CRL 84.04 mm

## 2022-08-04 NOTE — Progress Notes (Signed)
HIGH-RISK PREGNANCY VISIT Patient name: Audrey Matthews MRN [id number]  Date of birth: [birth year] Chief Complaint:   Routine Prenatal Visit  History of Present Illness:   Audrey Matthews is a 22 y.o. [phone] female at [redacted]w[redacted]d with an Estimated Date of Delivery: 01/31/23 being seen today for ongoing management of a high-risk pregnancy complicated by hx of cervical insufficiency on prometrium maintaining excellent cervical length and no distortion with pressure.    Today she reports no complaints. Contractions: Not present. Vag. Bleeding: None.  Movement: Absent. denies leaking of fluid.      07/20/2022   10:01 AM 11/05/2020    1:43 PM 02/09/2019    9:50 AM 12/27/2018    1:48 PM  Depression screen PHQ 2/9  Decreased Interest 0 1 0 1  Down, Depressed, Hopeless 0 1 0 1  PHQ - 2 Score 0 2 0 2  Altered sleeping 1 1 1  0  Tired, decreased energy 1 1 1 1   Change in appetite 2 0 0 2  Feeling bad or failure about yourself  0 1 0 1  Trouble concentrating 1 1 2  0  Moving slowly or fidgety/restless 0 0 3 1  Suicidal thoughts 0 0 0 0  PHQ-9 Score 5 6 7 7   Difficult doing work/chores   Somewhat difficult         07/20/2022   10:01 AM 11/05/2020    1:43 PM  GAD 7 : Generalized Anxiety Score  Nervous, Anxious, on Edge 1 1  Control/stop worrying 2 1  Worry too much - different things 1 1  Trouble relaxing 1 1  Restless 0 1  Easily annoyed or irritable 2 2  Afraid - awful might happen 1 1  Total GAD 7 Score 8 8     Review of Systems:   Pertinent items are noted in HPI Denies abnormal vaginal discharge w/ itching/odor/irritation, headaches, visual changes, shortness of breath, chest pain, abdominal pain, severe nausea/vomiting, or problems with urination or bowel movements unless otherwise stated above. Pertinent History Reviewed:  Reviewed past medical,surgical, social, obstetrical and family history.  Reviewed problem list, medications and allergies. Physical Assessment:   Vitals:    08/04/22 1038  BP: 117/80  Pulse: 83  Weight: 162 lb (73.5 kg)  Body mass index is 30.11 kg/m.           Physical Examination:   General appearance: alert, well appearing, and in no distress  Mental status: alert, oriented to person, place, and time  Skin: warm & dry   Extremities:      Cardiovascular: normal heart rate noted  Respiratory: normal respiratory effort, no distress  Abdomen: gravid, soft, non-tender  Pelvic: Cervical exam deferred         Fetal Status:     Movement: Absent    Fetal Surveillance Testing today: sonogram cervical length 4 cm   Chaperone: N/A    No results found for this or any previous visit (from the past 24 hour(s)).  Assessment & Plan:  High-risk pregnancy: [id number] at [redacted]w[redacted]d with an Estimated Date of Delivery: 01/31/23      ICD-10-CM   1. Supervision of high risk pregnancy in second trimester  O09.92     2. History of cervical insufficiency  Z87.42    doing very well on prometrium, wants to avoid cerclage if possible, will monitor  lengths and continue  nightly vaginal prometrium    3. History of cerclage, currently pregnant  O09.299    Z98.890  Meds: No orders of the defined types were placed in this encounter.   Orders: No orders of the defined types were placed in this encounter.    Labs/procedures today: U/S  Treatment Plan:  as above    Follow-up: No follow-ups on file.   Future Appointments  Date Time Provider Department Center  08/10/2022  1:15 PM [facility] HEALTH CLINICIAN [facility]  08/18/2022 10:00 AM [facility] [place] [facility] None  08/18/2022 10:50 AM Lazaro Arms, MD [facility]  09/01/2022 12:30 PM [facility] NURSE [facility]  09/01/2022 12:45 PM [facility] US4 [facility]  09/02/2022  9:10 AM Myna Hidalgo, DO [facility]  09/15/2022  2:15 PM [facility] [place] [facility] None  09/15/2022  3:10 PM Hermina Staggers, MD [facility]  09/29/2022 10:00 AM [facility] [place] [facility] None  09/29/2022 10:50  AM Lazaro Arms, MD [facility]  10/13/2022  2:15 PM [facility] [place] [facility] None  10/13/2022  3:10 PM Lazaro Arms, MD [facility]    No orders of the defined types were placed in this encounter.  Lazaro Arms  Attending Physician for the Center for [facility] 08/04/2022 11:47 AM

## 2022-08-10 ENCOUNTER — Ambulatory Visit (INDEPENDENT_AMBULATORY_CARE_PROVIDER_SITE_OTHER): Payer: Self-pay | Admitting: Clinical

## 2022-08-10 DIAGNOSIS — F909 Attention-deficit hyperactivity disorder, unspecified type: Secondary | ICD-10-CM

## 2022-08-10 DIAGNOSIS — F4312 Post-traumatic stress disorder, chronic: Secondary | ICD-10-CM

## 2022-08-10 DIAGNOSIS — F840 Autistic disorder: Secondary | ICD-10-CM

## 2022-08-10 NOTE — Patient Instructions (Addendum)
[facility] at [facility] [address] [place], [place] [address] [phone] (main office) [phone] [facility] office)  [email or web address]  Estate manager/land agent  (Childcare options, Early childcare development, etc.) [email or web address].cz   [employer] Child Care Facility Search Engine  [email or web address]  /Emotional Borders Group and Websites Here are a few free apps meant to help you to help yourself.  To find, try searching on the internet to see if the app is offered on Apple/Android devices. If your first choice doesn't come up on your device, the good news is that there are many choices! Play around with different apps to see which ones are helpful to you.    Calm This is an app meant to help increase calm feelings. Includes info, strategies, and tools for tracking your feelings.      Calm Harm  This app is meant to help with self-harm. Provides many 5-minute or 15-min coping strategies for doing instead of hurting yourself.       Healthy Minds Health Minds is a problem-solving tool to help deal with emotions and cope with stress you encounter wherever you are.      MindShift This app can help people cope with anxiety. Rather than trying to avoid anxiety, you can make an important shift and face it.      MY3  MY3 features a support system, safety plan and resources with the goal of offering a tool to use in a time of need.       My Life My Voice  This mood journal offers a simple solution for tracking your thoughts, feelings and moods. Animated emoticons can help identify your mood.       Relax Melodies Designed to help with sleep, on this app you can mix sounds and meditations for relaxation.      Smiling Mind Smiling Mind is meditation made easy: it's a simple tool that helps put a smile on your mind.        Stop, Breathe & Think  A friendly, simple guide for  people through meditations for mindfulness and compassion.  Stop, Breathe and Think Kids Enter your current feelings and choose a "mission" to help you cope. Offers videos for certain moods instead of just sound recordings.       Team Orange The goal of this tool is to help teens change how they think, act, and react. This app helps you focus on your own good feelings and experiences.      The [employer] Box The [employer] Station[employer] Box (VHB) contains simple tools to help patients with coping, relaxation, distraction, and positive thinking.       BRAINSTORMING  Develop a Plan Goals: Provide a way to start conversation about your new life with a baby Assist parents in recognizing and using resources within their reach Help pave the way before birth for an easier period of transition afterwards.  Make a list of the following information to keep in a central location: Full name of Mom and Partner: _____________________________________________ Baby's full name and Date of Birth: ___________________________________________ Home Address: ___________________________________________________________ ________________________________________________________________________ Home Phone: ____________________________________________________________ Parents' cell numbers: _____________________________________________________ ________________________________________________________________________ Name and contact info for OB: ______________________________________________ Name and contact info for Pediatrician:________________________________________ Contact info for Lactation Consultants: ________________________________________  REST and SLEEP *You each need at least 4-5 hours of uninterrupted sleep every day. Write specific names and contact information.* How are you going to rest in the postpartum period? While partner's home? When partner returns to work?  When you both return to work? Where  will your baby sleep? Who is available to help during the day? Evening? Night? Who could move in for a period to help support you? What are some ideas to help you get enough sleep? __________________________________________________________________________________________________________________________________________________________________________________________________________________________________________ NUTRITIOUS FOOD AND DRINK *Plan for meals before your baby is born so you can have healthy food to eat during the immediate postpartum period.* Who will look after breakfast? Lunch? Dinner? List names and contact information. Brainstorm quick, healthy ideas for each meal. What can you do before baby is born to prepare meals for the postpartum period? How can others help you with meals? Which grocery stores provide online shopping and delivery? Which restaurants offer take-out or delivery options? ______________________________________________________________________________________________________________________________________________________________________________________________________________________________________________________________________________________________________________________________________________________________________________________________________  CARE FOR MOM *It's important that mom is cared for and pampered in the postpartum period. Remember, the most important ways new mothers need care are: sleep, nutrition, gentle exercise, and time off.* Who can come take care of mom during this period? Make a list of people with their contact information. List some activities that make you feel cared for, rested, and energized? Who can make sure you have opportunities to do these things? Does mom have a space of her very own within your home that's just for her? Make a "[facility]" where she can be comfortable, rest, and renew herself  daily. ______________________________________________________________________________________________________________________________________________________________________________________________________________________________________________________________________________________________________________________________________________________________________________________________________    CARE FOR AND FEEDING BABY *Knowledgeable and encouraging people will offer the best support with regard to feeding your baby.* Educate yourself and choose the best feeding option for your baby. Make a list of people who will guide, support, and be a resource for you as your care for and feed your baby. (Friends that have breastfed or are currently breastfeeding, lactation consultants, breastfeeding support groups, etc.) Consider a postpartum doula. (These websites can give you information: dona.org & https://shea.org/) Seek out local breastfeeding resources like the breastfeeding support group at [employer] or [employer]. ______________________________________________________________________________________________________________________________________________________________________________________________________________________________________________________________________________________________________________________________________________________________________________________________________  Judson Roch AND ERRANDS Who can help with a thorough cleaning before baby is born? Make a list of people who will help with housekeeping and chores, like laundry, light cleaning, dishes, bathrooms, etc. Who can run some errands for you? What can you do to make sure you are stocked with basic supplies before baby is born? Who is going to do the  shopping? ______________________________________________________________________________________________________________________________________________________________________________________________________________________________________________________________________________________________________________________________________________________________________________________________________     Family Adjustment *Nurture yourselves.it helps parents be more loving and allows for better bonding with their child.* What sorts of things do you and partner enjoy doing together? Which activities help you to connect and strengthen your relationship? Make a list of those things. Make a list of people whom you trust to care for your baby so you can have some time together as a couple. What types of things help partner feel connected to Mom? Make a list. What needs will partner have in order to bond with baby? Other children? Who will care for them when you go into labor and while you are in the hospital? Think about what the needs of your older children might be. Who can help you meet those needs? In what ways are you helping them prepare for bringing baby home? List some specific strategies you have for family adjustment. _______________________________________________________________________________________________________________________________________________________________________________________________________________________________________________________________________________________________________________________________________________  SUPPORT *Someone who can empathize with experiences normalizes your problems and makes them more bearable.* Make a list of other friends, neighbors, and/or co-workers you know with infants (and small children, if applicable) with whom you can connect. Make a list of local  or online support groups, mom groups, etc. in which you can be  involved. ______________________________________________________________________________________________________________________________________________________________________________________________________________________________________________________________________________________________________________________________________________________________________________________________________  Childcare Plans Investigate and plan for childcare if mom is returning to work. Talk about mom's concerns about her transition back to work. Talk about partner's concerns regarding this transition.  Mental Health *Your mental health is one of the highest priorities for a pregnant or postpartum mom.* 1 in 5 women experience anxiety and/or depression from the time of conception through the first year after birth. Postpartum Mood Disorders are the #1 complication of pregnancy and childbirth and the suffering experienced by these mothers is not necessary! These illnesses are temporary and respond well to treatment, which often includes self-care, social support, talk therapy, and medication when needed. Women experiencing anxiety and depression often say things like: "I'm supposed to be happy.why do I feel so sad?", "Why can't I snap out of it?", "I'm having thoughts that scare me." There is no need to be embarrassed if you are feeling these symptoms: Overwhelmed, anxious, angry, sad, guilty, irritable, hopeless, exhausted but can't sleep You are NOT alone. You are NOT to blame. With help, you WILL be well. Where can I find help? Medical professionals such as your OB, midwife, gynecologist, family practitioner, primary care provider, pediatrician, or mental health providers; [facility] support groups: Feelings After Birth, Breastfeeding Support Group, Baby and Me Group, and Fit 4 Two exercise classes. You have permission to ask for help. It will confirm your feelings, validate your experiences,  share/learn coping strategies, and gain support and encouragement as you heal. You are important! BRAINSTORM Make a list of local resources, including resources for mom and for partner. Identify support groups. Identify people to call late at night - include names and contact info. Talk with partner about perinatal mood and anxiety disorders. Talk with your OB, midwife, and doula about baby blues and about perinatal mood and anxiety disorders. Talk with your pediatrician about perinatal mood and anxiety disorders.   Support & Sanity Savers   What do you really need?  Basics In preparing for a new baby, many expectant parents spend hours shopping for baby clothes, decorating the nursery, and deciding which car seat to buy. Yet most don't think much about what the reality of parenting a newborn will be like, and what they need to make it through that. So, here is the advice of experienced parents. We know you'll read this, and think "they're exaggerating, I don't really need that." Just trust [place] on these, OK? Plan for all of this, and if it turns out you don't need it, come back and teach [place] how you did it!  Must-Haves (Once baby's survival needs are met, make sure you attend to your own survival needs!) Sleep An average newborn sleeps 16-18 hours per day, over 6-7 sleep periods, rarely more than three hours at a time. It is normal and healthy for a newborn to wake throughout the night... but really hard on parents!! Naps. Prioritize sleep above any responsibilities like: cleaning house, visiting friends, running errands, etc.  Sleep whenever baby sleeps. If you can't nap, at least have restful times when baby eats. The more rest you get, the more patient you will be, the more emotionally stable, and better at solving problems.  Food You may not have realized it would be difficult to eat when you have a newborn. Yet, when we talk to countless new parents, they say things like "it may be 2:00 pm  when I realize I haven't had  breakfast yet." Or "every time we sit down to dinner, baby needs to eat, and my food gets cold, so I don't bother to eat it." Finger food. Before your baby is born, stock up with one months' worth of food that: 1) you can eat with one hand while holding a baby, 2) doesn't need to be prepped, 3) is good hot or cold, 4) doesn't spoil when left out for a few hours, and 5) you like to eat. Think about: nuts, dried fruit, Clif bars, pretzels, jerky, gogurt, baby carrots, apples, bananas, crackers, cheez-n-crackers, string cheese, hot pockets or frozen burritos to microwave, garden burgers and breakfast pastries to put in the toaster, yogurt drinks, etc. Restaurant Menus. Make lists of your favorite restaurants & menu items. When family/friends want to help, you can give specific information without much thought. They can either bring you the food or send gift cards for just the right meals. Freezer Meals.  Take some time to make a few meals to put in the freezer ahead of time.  Easy to freeze meals can be anything such as soup, lasagna, chicken pie, or spaghetti sauce. Set up a Meal Schedule.  Ask friends and family to sign up to bring you meals during the first few weeks of being home. (It can be passed around at baby showers!) You have no idea how helpful this will be until you are in the throes of parenting.  MachineLive.it is a great website to check out. Emotional Support Know who to call when you're stressed out. Parenting a newborn is very challenging work. There are times when it totally overwhelms your normal coping abilities. EVERY NEW PARENT NEEDS TO HAVE A PLAN FOR WHO TO CALL WHEN THEY JUST CAN'T COPE ANY MORE. (And it has to be someone other than the baby's other parent!) Before your baby is born, come up with at least one person you can call for support - write their phone number down and post it on the refrigerator. Anxiety & Sadness. Baby blues are normal after  pregnancy; however, there are more severe types of anxiety & sadness which can occur and should not be ignored.  They are always treatable, but you have to take the first step by reaching out for help. [facility] offers a "Mom Talk" group which meets every Tuesday from 10 am - 11 am.  This group is for new moms who need support and connection after their babies are born.  Call [phone].  Really, Really Helpful (Plan for them! Make sure these happen often!!) Physical Support with Taking Care of Yourselves Asking friends and family. Before your baby is born, set up a schedule of people who can come and visit and help out (or ask a friend to schedule for you). Any time someone says "let me know what I can do to help," sign them up for a day. When they get there, their job is not to take care of the baby (that's your job and your joy). Their job is to take care of you!  Postpartum doulas. If you don't have anyone you can call on for support, look into postpartum doulas:  professionals at helping parents with caring for baby, caring for themselves, getting breastfeeding started, and helping with household tasks. www.padanc.org is a helpful website for learning about doulas in our area. Peer Support / Parent Groups Why: One of the greatest ideas for new parents is to be around other new parents. Parent groups give you a chance to  share and listen to others who are going through the same season of life, get a sense of what is normal infant development by watching several babies learn and grow, share your stories of triumph and struggles with empathetic ears, and forgive your own mistakes when you realize all parents are learning by trial and error. Where to find: There are many places you can meet other new parents throughout our community.  [facility] offers the following classes for new moms and their little ones:  Baby and Me (Birth to Crawling) and Breastfeeding Support Group. Go to  www.conehealthybaby.com or call [phone] for more information. Time for your Relationship It's easy to get so caught up in meeting baby's immediate needs that it's hard to find time to connect with your partner, and meet the needs of your relationship. It's also easy to forget what "quality time with your partner" actually looks like. If you take your baby on a date, you'd be amazed how much of your couple time is spent feeding the baby, diapering the baby, admiring the baby, and talking about the baby. Dating: Try to take time for just the two of you. Babysitter tip: Sometimes when moms are breastfeeding a newborn, they find it hard to figure out how to schedule outings around baby's unpredictable feeding schedules. Have the babysitter come for a three hour period. When she comes over, if baby has just eaten, you can leave right away, and come back in two hours. If baby hasn't fed recently, you start the date at home. Once baby gets hungry and gets a good feeding in, you can head out for the rest of your date time. Date Nights at Home: If you can't get out, at least set aside one evening a week to prioritize your relationship: whenever baby dozes off or doesn't have any immediate needs, spend a little time focusing on each other. Potential conflicts: The main relationship conflicts that come up for new parents are: issues related to sexuality, financial stresses, a feeling of an unfair division of household tasks, and conflicts in parenting styles. The more you can work on these issues before baby arrives, the better!  Fun and Frills (Don't forget these. and don't feel guilty for indulging in them!) Everyone has something in life that is a fun little treat that they do just for themselves. It may be: reading the morning paper, or going for a daily jog, or having coffee with a friend once a week, or going to a movie on Friday nights, or fine chocolates, or bubble baths, or curling up with a good  book. Unless you do fun things for yourself every now and then, it's hard to have the energy for fun with your baby. Whatever your "special" treats are, make sure you find a way to continue to indulge in them after your baby is born. These special moments can recharge you, and allow you to return to baby with a new joy   PERINATAL MOOD DISORDERS: MATERNAL MENTAL HEALTH FROM CONCEPTION THROUGH THE POSTPARTUM PERIOD   _________________________________________Emergency and Crisis Resources If you are an imminent risk to self or others, are experiencing intense personal distress, and/or have noticed significant changes in activities of daily living, call:  911 [facility]: [phone]  [address] [place], [place], [address] Mobile Crisis: [phone] National Suicide Hotline: 988 Or visit the following crisis centers: Local Emergency Departments [facility]: [address] [place]  [phone]. Hours: 8:30AM-5PM. Insurance Accepted: Medicaid, Medicare, and Uninsured.  [facility]:  [address] [place]  Mon-Friday 8am-3pm, [phone]                                                                                  ___________ Non-Crisis Resources To identify specific providers that are covered by your insurance, contact your insurance company or local agencies:  [place]: [phone] [facility] [facility]: [phone] [place]: [phone] Postpartum Support International- Warm-line: 8105635951                                                      __Outpatient Therapy and Medication Management   Providers:  [facility]: [phone] Hours: 9AM-5PM  Insurance Accepted: Pilar Jarvis, Chase Caller, [place], Medicare  [facility]): [phone] Hours: 8AM-5:30PM  nsurance Accepted: All insurances EXCEPT AARP, [place],  [place], and [employer] of the [place]: [phone] Hours: 8AM-8PM Insurance Accepted: Ulla Gallo, Crista Luria, [place], Medicare, [phone] [facility]: [phone] Hours: 8:30AM-7PM Insurance Accepted: Ulla Gallo, Medicaid, Medicare, Tricare, [employer] [facility]:  [phone]   Hours:9AM-5PM Insurance Accepted:  Monia Pouch, Ezequiel Essex, [employer], [place], [employer] Care  [facility]: [phone] Hours: 9AM-5:30PM Insurance Accepted: Pilar Jarvis, Teodoro Spray, and Medicaid, Medicare, [facility]:  [phone] Hours: 9am-5pm Insurance Accepted: BCBS; they do not accept Medicaid/Medicare [facility]: [phone] Hours: 9am-9pm Insurance Accepted: All major insurance including Medicaid and Medicare [facility]: [phone] Hours: 9AM- 5PM Insurance Accepted: All insurances EXCEPT Medicaid and Medicare. [facility]: [phone]   ____________                                                                     Parenting Support Groups [facility]: [phone] [facility]:  [phone] Family Support Network: (support for children in the NICU and/or with special needs), [phone]   ___________                                                                 Mental Health Support Groups Mental Health Association: [phone]    _____________  Online Resources Postpartum Support International: SeekAlumni.co.za  800-944-4PPD Supporting Moms:  www.momssupportingmoms.net

## 2022-08-11 NOTE — BH Specialist Note (Signed)
Integrated Behavioral Health via Telemedicine Visit  08/24/2022 Dovey Willner [id number]  Number of Integrated Behavioral Health Clinician visits: 2- Second Visit  Session Start time: 1421   Session End time: 1458  Total time in minutes: 37   Referring Provider: Shawna Clamp, CNM Patient/Family location: Home [facility] Provider location: [facility]  All persons participating in visit: [name] and [facility]     Types of Service: Individual psychotherapy and Video visit  I connected with Joscelyn Amison and/or Cashay Latouche's  n/a  via  Telephone or Video Enabled Telemedicine Application  (Video is Caregility application) and verified that I am speaking with the correct person using two identifiers. Discussed confidentiality: Yes   I discussed the limitations of telemedicine and the availability of in person appointments.  Discussed there is a possibility of technology failure and discussed alternative modes of communication if that failure occurs.  I discussed that engaging in this telemedicine visit, they consent to the provision of behavioral healthcare and the services will be billed under their insurance.  Patient and/or legal guardian expressed understanding and consented to Telemedicine visit: Yes   Presenting Concerns: Patient and/or family reports the following symptoms/concerns: Grieving the loss of her beloved great aunt, as well as dreams of previous baby miscarried; loss of aunt difficult to process as currently under investigation (aunt's heart medicine was withheld by nursing home, given sodium instead; when family called 911, CNA took phone out of family's hand to stop call to 911).  Duration of problem: Funeral for aunt yesterday  Patient and/or Family's Strengths/Protective Factors: Social connections, Concrete supports in place (healthy food, safe environments, etc.), and Sense of  purpose  Goals Addressed: Patient will:  Reduce symptoms of: anxiety, depression, and stress   Increase knowledge and/or ability of: healthy habits   Demonstrate ability to: Begin healthy grieving over loss  Progress towards Goals: Ongoing  Interventions: Interventions utilized:  Functional Assessment of ADLs and Supportive Reflection Standardized Assessments completed: Not Needed  Patient and/or Family Response: Patient agrees with treatment plan.   Assessment: Patient currently experiencing Grief today; Autism spectrum disorder, Attention deficit hyperactivity disorder; Post-traumatic stress disorder  Patient may benefit from continued therapeutic intervention  .  Plan: Follow up with behavioral health clinician on : Two weeks Behavioral recommendations:  -Allow feelings of grief to come; accept emotional/practical support from supportive friends/family over the next two weeks -Prioritize healthy sleep as much as able the next two weeks -Continue keeping to daily routines (laundry, time outdoors, etc) as much as able, to help cope with current feelings of grief; kindness to self during times of being unable to follow daily routines as well -Read through Stages of Grief on After Visit Summary; use as needed Referral(s): Integrated [employer] (In Clinic)  I discussed the assessment and treatment plan with the patient and/or parent/guardian. They were provided an opportunity to ask questions and all were answered. They agreed with the plan and demonstrated an understanding of the instructions.   They were advised to call back or seek an in-person evaluation if the symptoms worsen or if the condition fails to improve as anticipated.  [name] , LCSW

## 2022-08-18 ENCOUNTER — Ambulatory Visit (INDEPENDENT_AMBULATORY_CARE_PROVIDER_SITE_OTHER): Payer: Medicaid Other

## 2022-08-18 ENCOUNTER — Encounter: Payer: Self-pay | Admitting: Obstetrics & Gynecology

## 2022-08-18 ENCOUNTER — Ambulatory Visit (INDEPENDENT_AMBULATORY_CARE_PROVIDER_SITE_OTHER): Payer: Medicaid Other | Admitting: Obstetrics & Gynecology

## 2022-08-18 VITALS — BP 115/77 | HR 78 | Wt 160.0 lb

## 2022-08-18 DIAGNOSIS — O26899 Other specified pregnancy related conditions, unspecified trimester: Secondary | ICD-10-CM

## 2022-08-18 DIAGNOSIS — Z3480 Encounter for supervision of other normal pregnancy, unspecified trimester: Secondary | ICD-10-CM

## 2022-08-18 DIAGNOSIS — O0992 Supervision of high risk pregnancy, unspecified, second trimester: Secondary | ICD-10-CM

## 2022-08-18 DIAGNOSIS — Z8742 Personal history of other diseases of the female genital tract: Secondary | ICD-10-CM | POA: Diagnosis not present

## 2022-08-18 DIAGNOSIS — Z3481 Encounter for supervision of other normal pregnancy, first trimester: Secondary | ICD-10-CM

## 2022-08-18 DIAGNOSIS — O09892 Supervision of other high risk pregnancies, second trimester: Secondary | ICD-10-CM

## 2022-08-18 DIAGNOSIS — O09292 Supervision of pregnancy with other poor reproductive or obstetric history, second trimester: Secondary | ICD-10-CM

## 2022-08-18 DIAGNOSIS — O09299 Supervision of pregnancy with other poor reproductive or obstetric history, unspecified trimester: Secondary | ICD-10-CM

## 2022-08-18 DIAGNOSIS — Z3A16 16 weeks gestation of pregnancy: Secondary | ICD-10-CM

## 2022-08-18 DIAGNOSIS — O09899 Supervision of other high risk pregnancies, unspecified trimester: Secondary | ICD-10-CM

## 2022-08-18 DIAGNOSIS — Z9889 Other specified postprocedural states: Secondary | ICD-10-CM

## 2022-08-18 NOTE — Progress Notes (Signed)
HIGH-RISK PREGNANCY VISIT Patient name: Audrey Matthews MRN [id number]  Date of birth: 02-23-2001 Chief Complaint:   Routine Prenatal Visit  History of Present Illness:   Audrey Matthews is a 22 y.o. [phone] female at [redacted]w[redacted]d with an Estimated Date of Delivery: 01/31/23 being seen today for ongoing management of a high-risk pregnancy complicated by history of cervical insufficiency with PPROM at 28 weeks, had an urgent cerclage last pregnancy.    Today she reports no complaints. Contractions: Not present. Vag. Bleeding: None.  Movement: Present. denies leaking of fluid.      07/20/2022   10:01 AM 11/05/2020    1:43 PM 02/09/2019    9:50 AM 12/27/2018    1:48 PM  Depression screen PHQ 2/9  Decreased Interest 0 1 0 1  Down, Depressed, Hopeless 0 1 0 1  PHQ - 2 Score 0 2 0 2  Altered sleeping 1 1 1  0  Tired, decreased energy 1 1 1 1   Change in appetite 2 0 0 2  Feeling bad or failure about yourself  0 1 0 1  Trouble concentrating 1 1 2  0  Moving slowly or fidgety/restless 0 0 3 1  Suicidal thoughts 0 0 0 0  PHQ-9 Score 5 6 7 7   Difficult doing work/chores   Somewhat difficult         07/20/2022   10:01 AM 11/05/2020    1:43 PM  GAD 7 : Generalized Anxiety Score  Nervous, Anxious, on Edge 1 1  Control/stop worrying 2 1  Worry too much - different things 1 1  Trouble relaxing 1 1  Restless 0 1  Easily annoyed or irritable 2 2  Afraid - awful might happen 1 1  Total GAD 7 Score 8 8     Review of Systems:   Pertinent items are noted in HPI Denies abnormal vaginal discharge w/ itching/odor/irritation, headaches, visual changes, shortness of breath, chest pain, abdominal pain, severe nausea/vomiting, or problems with urination or bowel movements unless otherwise stated above. Pertinent History Reviewed:  Reviewed past medical,surgical, social, obstetrical and family history.  Reviewed problem list, medications and allergies. Physical Assessment:   Vitals:   08/18/22 1038   BP: 115/77  Pulse: 78  Weight: 160 lb (72.6 kg)  Body mass index is 29.74 kg/m.           Physical Examination:   General appearance: alert, well appearing, and in no distress  Mental status: alert, oriented to person, place, and time  Skin: warm & dry   Extremities:      Cardiovascular: normal heart rate noted  Respiratory: normal respiratory effort, no distress  Abdomen: gravid, soft, non-tender  Pelvic: cervix on sonogram is stable even with pressure         Fetal Status:     Movement: Present    Fetal Surveillance Testing today: sonogram   Chaperone: N/A    No results found for this or any previous visit (from the past 24 hour(s)).  Assessment & Plan:  High-risk pregnancy: J4N8295 at [redacted]w[redacted]d with an Estimated Date of Delivery: 01/31/23      ICD-10-CM   1. Supervision of high risk pregnancy in second trimester  O09.92     2. History of cervical insufficiency  Z87.42    on prometrium 200 with good cervical length at this point    3. History of cerclage, currently pregnant  O09.299    Z98.890        Meds: No orders of the  defined types were placed in this encounter.   Orders: No orders of the defined types were placed in this encounter.    Labs/procedures today: U/S  Treatment Plan:  continue prometrium 200 mg pv    Follow-up: Return for keep scheduled.   Future Appointments  Date Time Provider Department Center  08/24/2022  2:15 PM [facility] HEALTH CLINICIAN [facility]  09/01/2022 12:30 PM [facility] NURSE [facility]  09/01/2022 12:45 PM [facility] US4 [facility]  09/02/2022  9:10 AM Myna Hidalgo, DO [facility]  09/15/2022  2:15 PM [facility] - [facility] [place] [facility] None  09/15/2022  3:10 PM Hermina Staggers, MD [facility]  09/29/2022 10:00 AM [facility] - [facility] [place] [facility] None  09/29/2022 10:50 AM Lazaro Arms, MD [facility]  10/14/2022  2:15 PM [facility] - [facility] [place] [facility] None  10/14/2022  3:10 PM Myna Hidalgo, DO [facility]    No orders of the  defined types were placed in this encounter.  Lazaro Arms  Attending Physician for the Center for [facility] 08/18/2022 11:00 AM

## 2022-08-18 NOTE — Progress Notes (Signed)
[place] 16+2 wks,cephalic,cx length with and w/o pressure 3.3-3.4 cm,amnionic sludge,FHR 159 bpm,anterior placenta gr 0,SVP of fluid 4.2 cm,

## 2022-08-24 ENCOUNTER — Ambulatory Visit (INDEPENDENT_AMBULATORY_CARE_PROVIDER_SITE_OTHER): Payer: No Typology Code available for payment source | Admitting: Clinical

## 2022-08-24 DIAGNOSIS — F4321 Adjustment disorder with depressed mood: Secondary | ICD-10-CM | POA: Diagnosis not present

## 2022-08-24 DIAGNOSIS — F909 Attention-deficit hyperactivity disorder, unspecified type: Secondary | ICD-10-CM

## 2022-08-24 DIAGNOSIS — F840 Autistic disorder: Secondary | ICD-10-CM

## 2022-08-24 DIAGNOSIS — F4312 Post-traumatic stress disorder, chronic: Secondary | ICD-10-CM

## 2022-08-24 NOTE — Patient Instructions (Signed)
[facility] [address] [place], [place] [address] [phone] (main office) [phone] ('s office)  Stages of Grief  Denial (including shock and numbness) Anger (including emotional outbursts and fear) Bargaining (including searching for answers, pleading for a different outcome, disorganization, panic) Depression (including guilt, loneliness, isolating, difficulty re-entering "normal" life) Acceptance (including hope, affirmations, finding new strength, new patterns, building new relationships, and reaching out to help others)  These stages do not necessarily come in this order, and there is no time frame for grief. It takes as long as it takes. For some, it may seem easier to avoid feeling emotional pain, but in order to heal, it helps to allow time and space to feel the emotions, as they are, and to allow the grieving process to take place. The first year after a significant loss may be the most difficult. Reach out to friends, family, and/or join a support group of others who are also grieving. Find ways to maintain connection to lost loved ones.   

## 2022-08-28 ENCOUNTER — Encounter: Payer: Self-pay | Admitting: *Deleted

## 2022-09-01 ENCOUNTER — Ambulatory Visit: Payer: Medicaid Other | Admitting: *Deleted

## 2022-09-01 ENCOUNTER — Ambulatory Visit: Payer: Medicaid Other | Attending: Obstetrics & Gynecology

## 2022-09-01 VITALS — BP 120/71 | HR 70

## 2022-09-01 DIAGNOSIS — O3432 Maternal care for cervical incompetence, second trimester: Secondary | ICD-10-CM | POA: Diagnosis not present

## 2022-09-01 DIAGNOSIS — Z3480 Encounter for supervision of other normal pregnancy, unspecified trimester: Secondary | ICD-10-CM | POA: Insufficient documentation

## 2022-09-01 DIAGNOSIS — O09299 Supervision of pregnancy with other poor reproductive or obstetric history, unspecified trimester: Secondary | ICD-10-CM | POA: Diagnosis present

## 2022-09-01 DIAGNOSIS — Z8759 Personal history of other complications of pregnancy, childbirth and the puerperium: Secondary | ICD-10-CM | POA: Insufficient documentation

## 2022-09-01 DIAGNOSIS — O3433 Maternal care for cervical incompetence, third trimester: Secondary | ICD-10-CM | POA: Diagnosis not present

## 2022-09-01 DIAGNOSIS — Z6791 Unspecified blood type, Rh negative: Secondary | ICD-10-CM | POA: Diagnosis not present

## 2022-09-01 DIAGNOSIS — O0933 Supervision of pregnancy with insufficient antenatal care, third trimester: Secondary | ICD-10-CM | POA: Insufficient documentation

## 2022-09-01 DIAGNOSIS — O09293 Supervision of pregnancy with other poor reproductive or obstetric history, third trimester: Secondary | ICD-10-CM | POA: Diagnosis not present

## 2022-09-01 DIAGNOSIS — Z3A28 28 weeks gestation of pregnancy: Secondary | ICD-10-CM | POA: Insufficient documentation

## 2022-09-01 DIAGNOSIS — Z9889 Other specified postprocedural states: Secondary | ICD-10-CM | POA: Diagnosis present

## 2022-09-01 DIAGNOSIS — Z3A18 18 weeks gestation of pregnancy: Secondary | ICD-10-CM

## 2022-09-01 DIAGNOSIS — O09292 Supervision of pregnancy with other poor reproductive or obstetric history, second trimester: Secondary | ICD-10-CM

## 2022-09-01 DIAGNOSIS — O26899 Other specified pregnancy related conditions, unspecified trimester: Secondary | ICD-10-CM

## 2022-09-01 DIAGNOSIS — O2693 Pregnancy related conditions, unspecified, third trimester: Secondary | ICD-10-CM | POA: Diagnosis not present

## 2022-09-02 ENCOUNTER — Encounter: Payer: Self-pay | Admitting: Obstetrics & Gynecology

## 2022-09-02 ENCOUNTER — Ambulatory Visit (INDEPENDENT_AMBULATORY_CARE_PROVIDER_SITE_OTHER): Payer: Medicaid Other | Admitting: Obstetrics & Gynecology

## 2022-09-02 VITALS — BP 117/76 | Wt 162.2 lb

## 2022-09-02 DIAGNOSIS — Z3A18 18 weeks gestation of pregnancy: Secondary | ICD-10-CM

## 2022-09-02 DIAGNOSIS — Z6791 Unspecified blood type, Rh negative: Secondary | ICD-10-CM

## 2022-09-02 DIAGNOSIS — Z3480 Encounter for supervision of other normal pregnancy, unspecified trimester: Secondary | ICD-10-CM

## 2022-09-02 DIAGNOSIS — O26892 Other specified pregnancy related conditions, second trimester: Secondary | ICD-10-CM

## 2022-09-02 DIAGNOSIS — O0992 Supervision of high risk pregnancy, unspecified, second trimester: Secondary | ICD-10-CM

## 2022-09-02 NOTE — Progress Notes (Signed)
HIGH-RISK PREGNANCY VISIT Patient name: Audrey Matthews MRN [id number]  Date of birth: [birth year] Chief Complaint:   Routine Prenatal Visit  History of Present Illness:   Audrey Matthews is a 22 y.o. [phone] female at [redacted]w[redacted]d with an Estimated Date of Delivery: 01/31/23 being seen today for ongoing management of a high-risk pregnancy complicated by:  -h/o PPROM, required cerclage- declined cerclage this pregnancy Followed by cervical length q 2wks, last completed 6/11 CL 3.2cm Currently asymptomatic  -Rh Neg  Today she reports  R leg numbness - reviewed conservative management- doing well with belly band  Contractions: Not present. Vag. Bleeding: None.  Movement: Present. denies leaking of fluid.      07/20/2022   10:01 AM 11/05/2020    1:43 PM 02/09/2019    9:50 AM 12/27/2018    1:48 PM  Depression screen PHQ 2/9  Decreased Interest 0 1 0 1  Down, Depressed, Hopeless 0 1 0 1  PHQ - 2 Score 0 2 0 2  Altered sleeping 1 1 1  0  Tired, decreased energy 1 1 1 1   Change in appetite 2 0 0 2  Feeling bad or failure about yourself  0 1 0 1  Trouble concentrating 1 1 2  0  Moving slowly or fidgety/restless 0 0 3 1  Suicidal thoughts 0 0 0 0  PHQ-9 Score 5 6 7 7   Difficult doing work/chores   Somewhat difficult      Current Outpatient Medications  Medication Instructions   acetaminophen (TYLENOL) 500 mg, Oral, Every 6 hours PRN   ADDERALL XR 10 MG 24 hr capsule 10 mg, Every morning   albuterol (VENTOLIN HFA) 108 (90 Base) MCG/ACT inhaler 2 puffs, Every 6 hours PRN   Doxylamine-Pyridoxine (DICLEGIS) 10-10 MG TBEC 2 tabs q hs, if sx persist add 1 tab q am on day 3, if sx persist add 1 tab q afternoon on day 4   famotidine (PEPCID) 20 mg, Oral, 2 times daily   prenatal vitamin w/FE, FA (PRENATAL 1 + 1) 27-1 MG TABS tablet 1 tablet, Oral, Daily   progesterone (PROMETRIUM) 200 mg, Vaginal, Daily at bedtime     Review of Systems:   Pertinent items are noted in HPI Denies abnormal  vaginal discharge w/ itching/odor/irritation, headaches, visual changes, shortness of breath, chest pain, abdominal pain, severe nausea/vomiting, or problems with urination or bowel movements unless otherwise stated above. Pertinent History Reviewed:  Reviewed past medical,surgical, social, obstetrical and family history.  Reviewed problem list, medications and allergies. Physical Assessment:   Vitals:   09/02/22 0917  BP: 117/76  Weight: 162 lb 3.2 oz (73.6 kg)  Body mass index is 30.15 kg/m.           Physical Examination:   General appearance: alert, well appearing, and in no distress  Mental status: normal mood, behavior, speech, dress, motor activity, and thought processes  Skin: warm & dry   Extremities: Edema: None    Cardiovascular: normal heart rate noted  Respiratory: normal respiratory effort, no distress  Abdomen: gravid, soft, non-tender  Pelvic: Cervical exam deferred         Fetal Status: Fetal Heart Rate (bpm): 145   Movement: Present    Fetal Surveillance Testing today: doppler   Chaperone: N/A    No results found for this or any previous visit (from the past 24 hour(s)).   Assessment & Plan:  High-risk pregnancy: J4N8295 at [redacted]w[redacted]d with an Estimated Date of Delivery: 01/31/23   1) h/o cervical insuff.  Cervical length appropriate, continue to follow q 2wks until 24wks  2) RH neg  Meds: No orders of the defined types were placed in this encounter.   Labs/procedures today: IT2 today  Treatment Plan:  as outlined above and routine OB care  Reviewed: Preterm labor symptoms and general obstetric precautions including but not limited to vaginal bleeding, contractions, leaking of fluid and fetal movement were reviewed in detail with the patient.  All questions were answered. Pt has home bp cuff. Check bp weekly, let us know if >140/90.   Follow-up: Return for as scheduled in 2wks.   Future Appointments  Date Time Provider Department Center  09/14/2022  1:45  PM [facility] HEALTH CLINICIAN [facility]  09/15/2022  2:15 PM [facility] - [facility] [place] [facility] None  09/15/2022  3:10 PM Lazaro Arms, MD [facility]  09/29/2022 10:00 AM [facility] - [facility] [place] [facility] None  09/29/2022 10:50 AM Lazaro Arms, MD [facility]  10/14/2022  2:15 PM [facility] - [facility] [place] [facility] None  10/14/2022  3:10 PM Myna Hidalgo, DO [facility]    Orders Placed This Encounter  Procedures   INTEGRATED 2    Myna Hidalgo, DO Attending Obstetrician & Gynecologist, Faculty Practice Center for [employer], [facility]

## 2022-09-02 NOTE — Progress Notes (Deleted)
HIGH-RISK PREGNANCY VISIT Patient name: Audrey Matthews MRN [id number]  Date of birth: 2000-06-04 Chief Complaint:   Routine Prenatal Visit  History of Present Illness:   Audrey Matthews is a 22 y.o. [phone] female at [redacted]w[redacted]d with an Estimated Date of Delivery: 01/31/23 being seen today for ongoing management of a high-risk pregnancy complicated by ***  Today she reports {sx:14538}.   Contractions: Not present. Vag. Bleeding: None.  Movement: Present. {Actions; denies-reports:120008} leaking of fluid.      07/20/2022   10:01 AM 11/05/2020    1:43 PM 02/09/2019    9:50 AM 12/27/2018    1:48 PM  Depression screen PHQ 2/9  Decreased Interest 0 1 0 1  Down, Depressed, Hopeless 0 1 0 1  PHQ - 2 Score 0 2 0 2  Altered sleeping 1 1 1  0  Tired, decreased energy 1 1 1 1   Change in appetite 2 0 0 2  Feeling bad or failure about yourself  0 1 0 1  Trouble concentrating 1 1 2  0  Moving slowly or fidgety/restless 0 0 3 1  Suicidal thoughts 0 0 0 0  PHQ-9 Score 5 6 7 7   Difficult doing work/chores   Somewhat difficult      Current Outpatient Medications  Medication Instructions   acetaminophen (TYLENOL) 500 mg, Oral, Every 6 hours PRN   ADDERALL XR 10 MG 24 hr capsule 10 mg, Every morning   albuterol (VENTOLIN HFA) 108 (90 Base) MCG/ACT inhaler 2 puffs, Every 6 hours PRN   Doxylamine-Pyridoxine (DICLEGIS) 10-10 MG TBEC 2 tabs q hs, if sx persist add 1 tab q am on day 3, if sx persist add 1 tab q afternoon on day 4   famotidine (PEPCID) 20 mg, Oral, 2 times daily   prenatal vitamin w/FE, FA (PRENATAL 1 + 1) 27-1 MG TABS tablet 1 tablet, Oral, Daily   progesterone (PROMETRIUM) 200 mg, Vaginal, Daily at bedtime     Review of Systems:   Pertinent items are noted in HPI Denies abnormal vaginal discharge w/ itching/odor/irritation***, headaches, visual changes, shortness of breath, chest pain, abdominal pain, severe nausea/vomiting, or problems with urination or bowel movements unless otherwise  stated above. Pertinent History Reviewed:  Reviewed past medical,surgical, social, obstetrical and family history.  Reviewed problem list, medications and allergies. Physical Assessment:   Vitals:   09/02/22 0917  Weight: 162 lb 3.2 oz (73.6 kg)  Body mass index is 30.15 kg/m.           Physical Examination:   General appearance: {:315021}  Mental status: {:313008}  Skin: warm & dry   Extremities: Edema: None    Cardiovascular: normal heart rate noted  Respiratory: normal respiratory effort, no distress  Abdomen: gravid, soft, non-tender  Pelvic: {Blank single:19197::"Cervical exam performed","Cervical exam deferred"}         Fetal Status:     Movement: Present    Fetal Surveillance Testing today: ***   Chaperone: {Chaperone:19197::"N/A","Latisha Cresenzo","Janet Young","Amanda Andrews","Peggy Dones","Nicole Jones","Angel Neas"}    No results found for this or any previous visit (from the past 24 hour(s)).   Assessment & Plan:  High-risk pregnancy: [id number] at [redacted]w[redacted]d with an Estimated Date of Delivery: 01/31/23   1) ***  2) ***  Meds: No orders of the defined types were placed in this encounter.   Labs/procedures today: ***  Treatment Plan:  ***  Reviewed: {Blank single:19197::"Term","Preterm"} labor symptoms and general obstetric precautions including but not limited to vaginal bleeding, contractions, leaking of fluid and  fetal movement were reviewed in detail with the patient.  All questions were answered. *** home bp cuff. Check bp weekly, let us know if >140/90.   Follow-up: No follow-ups on file.   Future Appointments  Date Time Provider Department Center  09/14/2022  1:45 PM [facility] HEALTH CLINICIAN [facility]  09/15/2022  2:15 PM [facility] [place] [facility] None  09/15/2022  3:10 PM Lazaro Arms, MD [facility]  09/29/2022 10:00 AM [facility] - [facility] [place] [facility] None  09/29/2022 10:50 AM Lazaro Arms, MD [facility]  10/14/2022  2:15 PM [facility] - [facility]  [place] [facility] None  10/14/2022  3:10 PM Myna Hidalgo, DO [facility]    Orders Placed This Encounter  Procedures   INTEGRATED 2    Myna Hidalgo, DO Attending Obstetrician & Gynecologist, Faculty Practice Center for [employer], [facility]

## 2022-09-03 NOTE — BH Specialist Note (Signed)
Integrated Behavioral Health via Telemedicine Visit  09/14/2022 Audrey Matthews [id number]  Number of Integrated Behavioral Health Clinician visits: 2- Second Visit  Session Start time: 1421   Session End time: 1458  Total time in minutes: 37   Referring Provider: Shawna Matthews, CNM Patient/Family location: Home [facility] Provider location: [facility]  All persons participating in visit: [name] and [facility]     Types of Service: Individual psychotherapy and Telephone visit  I connected with Audrey Matthews and/or Audrey Matthews's  n/a  via  Telephone or Video Enabled Telemedicine Application  (Video is Caregility application) and verified that I am speaking with the correct person using two identifiers. Discussed confidentiality: Yes   I discussed the limitations of telemedicine and the availability of in person appointments.  Discussed there is a possibility of technology failure and discussed alternative modes of communication if that failure occurs.  I discussed that engaging in this telemedicine visit, they consent to the provision of behavioral healthcare and the services will be billed under their insurance.  Patient and/or legal guardian expressed understanding and consented to Telemedicine visit: Yes   Presenting Concerns: Patient and/or family reports the following symptoms/concerns: Conflicted about a decision to make this week; processing several scenarios that happened the past week and outcomes.  Duration of problem: Ongoing; Severity of problem: moderate  Patient and/or Family's Strengths/Protective Factors: Social connections, Concrete supports in place (healthy food, safe environments, etc.), and Sense of purpose  Goals Addressed: Patient will:  Reduce symptoms of: anxiety, depression, and stress   Increase knowledge and/or ability of: self-management skills and stress reduction    Demonstrate ability to: Increase healthy adjustment to current life circumstances  Progress towards Goals: Ongoing  Interventions: Interventions utilized:  Manufacturing systems engineer and Supportive Reflection Standardized Assessments completed: Not Needed  Patient and/or Family Response: Patient agrees with treatment plan.   Assessment: Patient currently experiencing Autism spectrum disorder, Attention deficit hyperactivity disorder; Post-traumatic stress disorder; Grief.   Patient may benefit from continued therapeutic intervention  .  Plan: Follow up with behavioral health clinician on : Two weeks Behavioral recommendations:  -Continue using CALM relaxation breathing as needed daily, along with maintaining daily routines and healthy self-care -Plan to let MIL know prior to the weekend that you will need to tell husband her secret if she does not, with a firm day/time, giving her opportunity to tell him herself first.  -Consider sharing any housing resources on After Visit Summary with MIL as needed that may be helpful OR share with husband to tell his own mother Referral(s): Integrated Art gallery manager (In Clinic) and [employer]:  housing for family to share  I discussed the assessment and treatment plan with the patient and/or parent/guardian. They were provided an opportunity to ask questions and all were answered. They agreed with the plan and demonstrated an understanding of the instructions.   They were advised to call back or seek an in-person evaluation if the symptoms worsen or if the condition fails to improve as anticipated.  Audrey Lips, LCSW     07/20/2022   10:01 AM 11/05/2020    1:43 PM 02/09/2019    9:50 AM 12/27/2018    1:48 PM  Depression screen PHQ 2/9  Decreased Interest 0 1 0 1  Down, Depressed, Hopeless 0 1 0 1  PHQ - 2 Score 0 2 0 2  Altered sleeping 1 1 1  0  Tired, decreased energy 1 1 1  1  Change in appetite 2 0 0 2  Feeling bad or  failure about yourself  0 1 0 1  Trouble concentrating 1 1 2  0  Moving slowly or fidgety/restless 0 0 3 1  Suicidal thoughts 0 0 0 0  PHQ-9 Score 5 6 7 7   Difficult doing work/chores   Somewhat difficult       07/20/2022   10:01 AM 11/05/2020    1:43 PM  GAD 7 : Generalized Anxiety Score  Nervous, Anxious, on Edge 1 1  Control/stop worrying 2 1  Worry too much - different things 1 1  Trouble relaxing 1 1  Restless 0 1  Easily annoyed or irritable 2 2  Afraid - awful might happen 1 1  Total GAD 7 Score 8 8

## 2022-09-04 LAB — INTEGRATED 2
AFP MoM: 1.13
Alpha-Fetoprotein: 47 ng/mL
Crown Rump Length: 56 mm
DIA MoM: 0.82
DIA Value: 122.8 pg/mL
Estriol, Unconjugated: 2.25 ng/mL
Gest. Age on Collection Date: 12 weeks
Gestational Age: 18.3 weeks
Maternal Age at EDD: 23.4 yr
Nuchal Translucency (NT): 1.7 mm
Nuchal Translucency MoM: 1.36
Number of Fetuses: 1
PAPP-A MoM: 1.85
PAPP-A Value: 1370.2 ng/mL
Test Results:: NEGATIVE
Weight: 161 [lb_av]
Weight: 161 [lb_av]
hCG MoM: 1.01
hCG Value: 24.8 IU/mL
uE3 MoM: 1.47

## 2022-09-14 ENCOUNTER — Ambulatory Visit (INDEPENDENT_AMBULATORY_CARE_PROVIDER_SITE_OTHER): Payer: No Typology Code available for payment source | Admitting: Clinical

## 2022-09-14 DIAGNOSIS — F4312 Post-traumatic stress disorder, chronic: Secondary | ICD-10-CM

## 2022-09-14 DIAGNOSIS — F840 Autistic disorder: Secondary | ICD-10-CM | POA: Diagnosis not present

## 2022-09-14 DIAGNOSIS — F4321 Adjustment disorder with depressed mood: Secondary | ICD-10-CM

## 2022-09-14 DIAGNOSIS — F909 Attention-deficit hyperactivity disorder, unspecified type: Secondary | ICD-10-CM

## 2022-09-14 NOTE — Patient Instructions (Signed)
[facility] [address] [place], [place] [address] [phone] (main office) [phone] [facility] office)   Housing Resources                    [employer] (serves [place], [place], [place], [place], [place], [place], [place], [place], [place], [place], [place], [place], and [place] counties) [address] [place], [place] [address] [phone] DeveloperU.ch  **Rental assistance, Home Rehabilitation,Weatherization Assistance Program, Chief Financial Officer, Housing Voucher Program  [facility] [address] [place]wny Ho[place], [place] [address] [phone] PhoneCaptions.ch **Programs include: Hospital doctor and Housing Counseling, Healthy Homes/Tenant Advocacy, [facility] Prevention and Housing Assistance  [facility] [address], [place], [place] [address] [phone] www.PaintingEmporium.co.za **housing applications/recertification; tax payment relief/exemption under specific qualifications  [employer] [address], [place], [place] [address] [phone] https://chshousing.org **Home Ownership/Affordable Housing Program and Home Repair Program  [facility] [address] [place], [place] [address] [phone] ShowReturn.ca **Home Expense Assistance/Repairs Program; offers home accessibility updates, such as ramps or bars in the bathroom  [employer] [address], [place], [place] [address] [phone] www.sen[employer].org **Home expense assistance/repairs for older adults  [facility] [address], [place], [place] [address] [phone] [email or web address].[email or web address] **Housing applications/recertification; tax payment  relief/exemption w specific qualifications

## 2022-09-15 ENCOUNTER — Ambulatory Visit (INDEPENDENT_AMBULATORY_CARE_PROVIDER_SITE_OTHER): Payer: Medicaid Other | Admitting: Obstetrics & Gynecology

## 2022-09-15 ENCOUNTER — Ambulatory Visit (INDEPENDENT_AMBULATORY_CARE_PROVIDER_SITE_OTHER): Payer: Medicaid Other

## 2022-09-15 ENCOUNTER — Encounter: Payer: Self-pay | Admitting: Obstetrics & Gynecology

## 2022-09-15 VITALS — BP 104/61 | HR 70 | Wt 166.0 lb

## 2022-09-15 DIAGNOSIS — O09899 Supervision of other high risk pregnancies, unspecified trimester: Secondary | ICD-10-CM

## 2022-09-15 DIAGNOSIS — Z9889 Other specified postprocedural states: Secondary | ICD-10-CM

## 2022-09-15 DIAGNOSIS — O09892 Supervision of other high risk pregnancies, second trimester: Secondary | ICD-10-CM

## 2022-09-15 DIAGNOSIS — Z3A20 20 weeks gestation of pregnancy: Secondary | ICD-10-CM

## 2022-09-15 DIAGNOSIS — O09299 Supervision of pregnancy with other poor reproductive or obstetric history, unspecified trimester: Secondary | ICD-10-CM

## 2022-09-15 DIAGNOSIS — Z8742 Personal history of other diseases of the female genital tract: Secondary | ICD-10-CM | POA: Diagnosis not present

## 2022-09-15 DIAGNOSIS — O0992 Supervision of high risk pregnancy, unspecified, second trimester: Secondary | ICD-10-CM

## 2022-09-15 DIAGNOSIS — Z3480 Encounter for supervision of other normal pregnancy, unspecified trimester: Secondary | ICD-10-CM

## 2022-09-15 DIAGNOSIS — O09292 Supervision of pregnancy with other poor reproductive or obstetric history, second trimester: Secondary | ICD-10-CM

## 2022-09-15 DIAGNOSIS — Z6791 Unspecified blood type, Rh negative: Secondary | ICD-10-CM

## 2022-09-15 DIAGNOSIS — Z3481 Encounter for supervision of other normal pregnancy, first trimester: Secondary | ICD-10-CM

## 2022-09-15 NOTE — Progress Notes (Signed)
[place] TA/TV: 20+2 wks,cephalic,anterior placenta gr 0,normal ovaries,CX 3-3.4 cm with and w/o pressure,amnionic sludge,SVP of fluid 5 cm,FHR 161 bpm,EFW 355 g 54%,limited view of NB,NB seen on prior ultrasound,no obvious abnormalities

## 2022-09-15 NOTE — Progress Notes (Signed)
HIGH-RISK PREGNANCY VISIT Patient name: Audrey Matthews MRN [id number]  Date of birth: October 25, 2000 Chief Complaint:   Routine Prenatal Visit  History of Present Illness:   Audrey Matthews is a 22 y.o. [phone] female at [redacted]w[redacted]d with an Estimated Date of Delivery: 01/31/23 being seen today for ongoing management of a high-risk pregnancy complicated by Hx of cervical insufficiency on vaginal prometrium 200 mg qhs.    Today she reports no complaints. Contractions: Not present. Vag. Bleeding: None.  Movement: Present. denies leaking of fluid.      07/20/2022   10:01 AM 11/05/2020    1:43 PM 02/09/2019    9:50 AM 12/27/2018    1:48 PM  Depression screen PHQ 2/9  Decreased Interest 0 1 0 1  Down, Depressed, Hopeless 0 1 0 1  PHQ - 2 Score 0 2 0 2  Altered sleeping 1 1 1  0  Tired, decreased energy 1 1 1 1   Change in appetite 2 0 0 2  Feeling bad or failure about yourself  0 1 0 1  Trouble concentrating 1 1 2  0  Moving slowly or fidgety/restless 0 0 3 1  Suicidal thoughts 0 0 0 0  PHQ-9 Score 5 6 7 7   Difficult doing work/chores   Somewhat difficult         07/20/2022   10:01 AM 11/05/2020    1:43 PM  GAD 7 : Generalized Anxiety Score  Nervous, Anxious, on Edge 1 1  Control/stop worrying 2 1  Worry too much - different things 1 1  Trouble relaxing 1 1  Restless 0 1  Easily annoyed or irritable 2 2  Afraid - awful might happen 1 1  Total GAD 7 Score 8 8     Review of Systems:   Pertinent items are noted in HPI Denies abnormal vaginal discharge w/ itching/odor/irritation, headaches, visual changes, shortness of breath, chest pain, abdominal pain, severe nausea/vomiting, or problems with urination or bowel movements unless otherwise stated above. Pertinent History Reviewed:  Reviewed past medical,surgical, social, obstetrical and family history.  Reviewed problem list, medications and allergies. Physical Assessment:   Vitals:   09/15/22 1527  BP: 104/61  Pulse: 70  Weight:  166 lb (75.3 kg)  Body mass index is 30.86 kg/m.           Physical Examination:   General appearance: alert, well appearing, and in no distress  Mental status: alert, oriented to person, place, and time  Skin: warm & dry   Extremities: Edema: None    Cardiovascular: normal heart rate noted  Respiratory: normal respiratory effort, no distress  Abdomen: gravid, soft, non-tender  Pelvic: Cervical exam deferred         Fetal Status:     Movement: Present    Fetal Surveillance Testing today: sonogram   Chaperone: N/A    No results found for this or any previous visit (from the past 24 hour(s)).  Assessment & Plan:  High-risk pregnancy: [id number] at [redacted]w[redacted]d with an Estimated Date of Delivery: 01/31/23      ICD-10-CM   1. Supervision of high risk pregnancy in second trimester  O09.92     2. History of cerclage, currently pregnant  O09.299    Z98.890         Meds: No orders of the defined types were placed in this encounter.   Orders: No orders of the defined types were placed in this encounter.    Labs/procedures today: U/S  Treatment Plan:  keep  scheduled    Follow-up: No follow-ups on file.   Future Appointments  Date Time Provider Department Center  09/28/2022  1:45 PM [facility] HEALTH CLINICIAN [facility]  09/29/2022 10:00 AM [facility] [place] [facility] None  09/29/2022 10:50 AM Lazaro Arms, MD [facility]  10/14/2022  2:15 PM [facility] - [facility] [place] [facility] None  10/14/2022  3:10 PM Myna Hidalgo, DO [facility]TO[facility]    No orders of the defined types were placed in this encounter.  Lazaro Arms  Attending Physician for the [facility] 09/15/2022 4:22 PM

## 2022-09-15 NOTE — BH Specialist Note (Unsigned)
Integrated Behavioral Health via Telemedicine Visit  09/29/2022 Maylani Burrowes [id number]  Number of Integrated Behavioral Health Clinician visits: 3- Third Visit  Session Start time: 1347   Session End time: 1434  Total time in minutes: 47   Referring Provider: Shawna Clamp, CNM Patient/Family location: Home [facility] Provider location: [facility]  All persons participating in visit: Patient Pasty Olivencia and [facility]     Types of Service: Individual psychotherapy and Video visit  I connected with Erminie Paz and/or Keyonda Mamaril's  n/a  via  Telephone or Video Enabled Telemedicine Application  (Video is Caregility application) and verified that I am speaking with the correct person using two identifiers. Discussed confidentiality: Yes   I discussed the limitations of telemedicine and the availability of in person appointments.  Discussed there is a possibility of technology failure and discussed alternative modes of communication if that failure occurs.  I discussed that engaging in this telemedicine visit, they consent to the provision of behavioral healthcare and the services will be billed under their insurance.  Patient and/or legal guardian expressed understanding and consented to Telemedicine visit: Yes   Presenting Concerns: Patient and/or family reports the following symptoms/concerns: Episodes of emotional mood instability/irritability, lashing out at others; notices these are triggered by being hungry, tired, overheated and insensitive comments from others, along with reminders of childhood traumas.  Duration of problem: Ongoing; Severity of problem:  moderately severe  Patient and/or Family's Strengths/Protective Factors: Social connections, Concrete supports in place (healthy food, safe environments, etc.), and Sense of purpose  Goals Addressed: Patient will:  Reduce symptoms of: anxiety, depression,  mood instability, and stress   Increase knowledge and/or ability of: healthy habits and self-management skills   Demonstrate ability to: Increase healthy adjustment to current life circumstances and Increase motivation to adhere to plan of care  Progress towards Goals: Ongoing  Interventions: Interventions utilized:  Motivational Interviewing and Psychoeducation and/or Health Education Standardized Assessments completed: Not Needed  Patient and/or Family Response: Patient agrees with treatment plan.   Assessment: Patient currently experiencing Post-traumatic stress disorder, Autism spectrum disorder, Attention deficit hyperactivity disorder, Grief.   Patient may benefit from psychoeducation and brief therapeutic interventions regarding coping with symptoms of anxiety, depression, mood instability, life stress.   Plan: Follow up with behavioral health clinician on : Two weeks Behavioral recommendations:  -Continue taking prenatal vitamin and Zoloft daily as prescribed -Begin prioritizing extra self-care to minimize mood triggers (consider setting phone timers every 2 hours daytime as reminders to check in with yourself and ask: Am I hungry? Am I tired? Am I too hot or cold?) -Continue using relaxation breathing exercises daily as needed Referral(s): Integrated [employer] (In Clinic)  I discussed the assessment and treatment plan with the patient and/or parent/guardian. They were provided an opportunity to ask questions and all were answered. They agreed with the plan and demonstrated an understanding of the instructions.   They were advised to call back or seek an in-person evaluation if the symptoms worsen or if the condition fails to improve as anticipated.      07/20/2022   10:01 AM 11/05/2020    1:43 PM 02/09/2019    9:50 AM 12/27/2018    1:48 PM  Depression screen PHQ 2/9  Decreased Interest 0 1 0 1  Down, Depressed, Hopeless 0 1 0 1  PHQ - 2 Score 0 2 0 2   Altered sleeping 1 1 1  0  Tired, decreased energy  1 1 1 1   Change in appetite 2 0 0 2  Feeling bad or failure about yourself  0 1 0 1  Trouble concentrating 1 1 2  0  Moving slowly or fidgety/restless 0 0 3 1  Suicidal thoughts 0 0 0 0  PHQ-9 Score 5 6 7 7   Difficult doing work/chores   Somewhat difficult       07/20/2022   10:01 AM 11/05/2020    1:43 PM  GAD 7 : Generalized Anxiety Score  Nervous, Anxious, on Edge 1 1  Control/stop worrying 2 1  Worry too much - different things 1 1  Trouble relaxing 1 1  Restless 0 1  Easily annoyed or irritable 2 2  Afraid - awful might happen 1 1  Total GAD 7 Score 8 8

## 2022-09-28 ENCOUNTER — Ambulatory Visit (INDEPENDENT_AMBULATORY_CARE_PROVIDER_SITE_OTHER): Payer: MEDICAID | Admitting: Clinical

## 2022-09-28 DIAGNOSIS — F909 Attention-deficit hyperactivity disorder, unspecified type: Secondary | ICD-10-CM

## 2022-09-28 DIAGNOSIS — F4312 Post-traumatic stress disorder, chronic: Secondary | ICD-10-CM

## 2022-09-28 DIAGNOSIS — F840 Autistic disorder: Secondary | ICD-10-CM

## 2022-09-29 ENCOUNTER — Encounter: Payer: Self-pay | Admitting: Obstetrics & Gynecology

## 2022-09-29 ENCOUNTER — Ambulatory Visit: Payer: MEDICAID

## 2022-09-29 ENCOUNTER — Ambulatory Visit (INDEPENDENT_AMBULATORY_CARE_PROVIDER_SITE_OTHER): Payer: MEDICAID | Admitting: Obstetrics & Gynecology

## 2022-09-29 VITALS — BP 110/68 | HR 95 | Wt 164.5 lb

## 2022-09-29 DIAGNOSIS — Z3A22 22 weeks gestation of pregnancy: Secondary | ICD-10-CM

## 2022-09-29 DIAGNOSIS — Z8742 Personal history of other diseases of the female genital tract: Secondary | ICD-10-CM | POA: Diagnosis not present

## 2022-09-29 DIAGNOSIS — O0992 Supervision of high risk pregnancy, unspecified, second trimester: Secondary | ICD-10-CM

## 2022-09-29 DIAGNOSIS — O09292 Supervision of pregnancy with other poor reproductive or obstetric history, second trimester: Secondary | ICD-10-CM

## 2022-09-29 DIAGNOSIS — O09892 Supervision of other high risk pregnancies, second trimester: Secondary | ICD-10-CM

## 2022-09-29 DIAGNOSIS — Z9889 Other specified postprocedural states: Secondary | ICD-10-CM

## 2022-09-29 DIAGNOSIS — Z3480 Encounter for supervision of other normal pregnancy, unspecified trimester: Secondary | ICD-10-CM

## 2022-09-29 DIAGNOSIS — Z3481 Encounter for supervision of other normal pregnancy, first trimester: Secondary | ICD-10-CM

## 2022-09-29 DIAGNOSIS — O09899 Supervision of other high risk pregnancies, unspecified trimester: Secondary | ICD-10-CM

## 2022-09-29 DIAGNOSIS — Z6791 Unspecified blood type, Rh negative: Secondary | ICD-10-CM

## 2022-09-29 DIAGNOSIS — O09299 Supervision of pregnancy with other poor reproductive or obstetric history, unspecified trimester: Secondary | ICD-10-CM

## 2022-09-29 NOTE — Patient Instructions (Signed)
[facility] [address] [place], [place] [address] [phone] (main office) [phone] ('s office)   

## 2022-09-29 NOTE — BH Specialist Note (Signed)
Integrated Behavioral Health via Telemedicine Visit  10/12/2022 Audrey Matthews [id number]  Number of Integrated Behavioral Health Clinician visits: 4- Fourth Visit  Session Start time: 1555   Session End time: 1649  Total time in minutes: 54   Referring Provider: Shawna Matthews, CNM Patient/Family location: Home [facility] Provider location: [facility]  All persons participating in visit: [name] and Audrey Matthews     Types of Service: Individual psychotherapy and Video visit  I connected with Audrey Matthews and/or Audrey Matthews's  n/a  via  Telephone or Video Enabled Telemedicine Application  (Video is Caregility application) and verified that I am speaking with the correct person using two identifiers. Discussed confidentiality: Yes   I discussed the limitations of telemedicine and the availability of in person appointments.  Discussed there is a possibility of technology failure and discussed alternative modes of communication if that failure occurs.  I discussed that engaging in this telemedicine visit, they consent to the provision of behavioral healthcare and the services will be billed under their insurance.  Patient and/or legal guardian expressed understanding and consented to Telemedicine visit: Yes   Presenting Concerns: Patient and/or family reports the following symptoms/concerns: Grieving the loss of family friend/like a sister, triggering emotions from previous losses. Duration: Two weeks  Patient and/or Family's Strengths/Protective Factors: Social connections, Concrete supports in place (healthy food, safe environments, etc.), Sense of purpose, and Physical Health (exercise, healthy diet, medication compliance, etc.)  Goals Addressed: Patient will:  Reduce symptoms of: mood instability    Demonstrate ability to: Begin healthy grieving over loss  Progress towards  Goals: Revised  Interventions: Interventions utilized:  Link to [employer] and Supportive Reflection Standardized Assessments completed: Not Needed  Patient and/or Family Response: Patient agrees with treatment plan.   Assessment: Patient currently experiencing Grief today; Post-traumatic stress disorder, Autism spectrum disorder, ADHD  Patient may benefit from continued therapeutic intervention  .  Plan: Follow up with behavioral health clinician on : Two weeks Behavioral recommendations:  -Continue taking prenatal vitamins and other medications as prescribed -Continue using self-coping strategies daily as needed; allowing time and space to grieve loss -Continue being mindful of hunger, thirst, temperature throughout each day; prioritizing self-care to minimize mood triggers Referral(s): Integrated Art gallery manager (In Clinic) and [employer]:  grief support  I discussed the assessment and treatment plan with the patient and/or parent/guardian. They were provided an opportunity to ask questions and all were answered. They agreed with the plan and demonstrated an understanding of the instructions.   They were advised to call back or seek an in-person evaluation if the symptoms worsen or if the condition fails to improve as anticipated.  Audrey Matthews , LCSW     07/20/2022   10:01 AM 11/05/2020    1:43 PM 02/09/2019    9:50 AM 12/27/2018    1:48 PM  Depression screen PHQ 2/9  Decreased Interest 0 1 0 1  Down, Depressed, Hopeless 0 1 0 1  PHQ - 2 Score 0 2 0 2  Altered sleeping 1 1 1  0  Tired, decreased energy 1 1 1 1   Change in appetite 2 0 0 2  Feeling bad or failure about yourself  0 1 0 1  Trouble concentrating 1 1 2  0  Moving slowly or fidgety/restless 0 0 3 1  Suicidal thoughts 0 0 0 0  PHQ-9 Score 5 6 7 7   Difficult doing work/chores   Somewhat difficult  07/20/2022   10:01 AM 11/05/2020    1:43 PM  GAD 7 : Generalized Anxiety  Score  Nervous, Anxious, on Edge 1 1  Control/stop worrying 2 1  Worry too much - different things 1 1  Trouble relaxing 1 1  Restless 0 1  Easily annoyed or irritable 2 2  Afraid - awful might happen 1 1  Total GAD 7 Score 8 8

## 2022-09-29 NOTE — Progress Notes (Signed)
HIGH-RISK PREGNANCY VISIT Patient name: Audrey Matthews MRN [id number]  Date of birth: Mar 14, 2001 Chief Complaint:   Routine Prenatal Visit  History of Present Illness:   Audrey Matthews is a 22 y.o. [phone] female at [redacted]w[redacted]d with an Estimated Date of Delivery: 01/31/23 being seen today for ongoing management of a high-risk pregnancy complicated by history of cervical insufficiency being effectively managed with vaginal prometrium with stable cervical length.    Today she reports no complaints. Contractions: Not present.  .  Movement: Present. denies leaking of fluid.      07/20/2022   10:01 AM 11/05/2020    1:43 PM 02/09/2019    9:50 AM 12/27/2018    1:48 PM  Depression screen PHQ 2/9  Decreased Interest 0 1 0 1  Down, Depressed, Hopeless 0 1 0 1  PHQ - 2 Score 0 2 0 2  Altered sleeping 1 1 1  0  Tired, decreased energy 1 1 1 1   Change in appetite 2 0 0 2  Feeling bad or failure about yourself  0 1 0 1  Trouble concentrating 1 1 2  0  Moving slowly or fidgety/restless 0 0 3 1  Suicidal thoughts 0 0 0 0  PHQ-9 Score 5 6 7 7   Difficult doing work/chores   Somewhat difficult         07/20/2022   10:01 AM 11/05/2020    1:43 PM  GAD 7 : Generalized Anxiety Score  Nervous, Anxious, on Edge 1 1  Control/stop worrying 2 1  Worry too much - different things 1 1  Trouble relaxing 1 1  Restless 0 1  Easily annoyed or irritable 2 2  Afraid - awful might happen 1 1  Total GAD 7 Score 8 8     Review of Systems:   Pertinent items are noted in HPI Denies abnormal vaginal discharge w/ itching/odor/irritation, headaches, visual changes, shortness of breath, chest pain, abdominal pain, severe nausea/vomiting, or problems with urination or bowel movements unless otherwise stated above. Pertinent History Reviewed:  Reviewed past medical,surgical, social, obstetrical and family history.  Reviewed problem list, medications and allergies. Physical Assessment:   Vitals:   09/29/22 1037  BP:  110/68  Pulse: 95  Weight: 164 lb 8 oz (74.6 kg)  Body mass index is 30.58 kg/m.           Physical Examination:   General appearance: alert, well appearing, and in no distress  Mental status: alert, oriented to person, place, and time  Skin: warm & dry   Extremities: Edema: Trace    Cardiovascular: normal heart rate noted  Respiratory: normal respiratory effort, no distress  Abdomen: gravid, soft, non-tender  Pelvic: Cervical exam deferred         Fetal Status:     Movement: Present    Fetal Surveillance Testing today: sonogram   Chaperone: N/A    No results found for this or any previous visit (from the past 24 hour(s)).  Assessment & Plan:  High-risk pregnancy: [id number] at [redacted]w[redacted]d with an Estimated Date of Delivery: 01/31/23      ICD-10-CM   1. Supervision of high risk pregnancy in second trimester  O09.92     2. History of cerclage, currently pregnant  O09.299    Z98.890     3. History of cervical insufficiency, on vaginal prometrium for management  Z87.42        Meds: No orders of the defined types were placed in this encounter.   Orders: No orders  of the defined types were placed in this encounter.    Labs/procedures today: U/S  Treatment Plan:  continue to monitor CL   Follow-up: Return for keep scheduled.   Future Appointments  Date Time Provider Department Center  10/12/2022  3:45 PM [facility] HEALTH CLINICIAN [facility]  10/14/2022  2:15 PM [facility] - [facility] [place] [facility] None  10/14/2022  3:10 PM Myna Hidalgo, DO [facility] FTO[facility]YN    No orders of the defined types were placed in this encounter.  Lazaro Arms  Attending Physician for the Center for [facility] 09/29/2022 11:27 AM

## 2022-09-29 NOTE — Progress Notes (Signed)
[place] 22+2 wks,cephalic,anterior placenta gr 0,SVP 5.2 cm,FHR 152 bpm,2.1-2.9 cm with and w/o pressure,no funneling

## 2022-10-01 ENCOUNTER — Encounter: Payer: Self-pay | Admitting: Women's Health

## 2022-10-11 ENCOUNTER — Encounter (HOSPITAL_COMMUNITY): Payer: Self-pay | Admitting: Obstetrics and Gynecology

## 2022-10-11 ENCOUNTER — Inpatient Hospital Stay (HOSPITAL_COMMUNITY)
Admission: AD | Admit: 2022-10-11 | Discharge: 2022-10-12 | Disposition: A | Discharge status: Home / Self Care | Payer: MEDICAID | Attending: Obstetrics and Gynecology | Admitting: Obstetrics and Gynecology

## 2022-10-11 DIAGNOSIS — O26892 Other specified pregnancy related conditions, second trimester: Secondary | ICD-10-CM | POA: Diagnosis not present

## 2022-10-11 DIAGNOSIS — O219 Vomiting of pregnancy, unspecified: Secondary | ICD-10-CM | POA: Insufficient documentation

## 2022-10-11 DIAGNOSIS — M549 Dorsalgia, unspecified: Secondary | ICD-10-CM | POA: Insufficient documentation

## 2022-10-11 DIAGNOSIS — R109 Unspecified abdominal pain: Secondary | ICD-10-CM | POA: Diagnosis present

## 2022-10-11 DIAGNOSIS — Z3A24 24 weeks gestation of pregnancy: Secondary | ICD-10-CM | POA: Diagnosis not present

## 2022-10-11 DIAGNOSIS — O99891 Other specified diseases and conditions complicating pregnancy: Secondary | ICD-10-CM

## 2022-10-11 LAB — URINALYSIS, ROUTINE W REFLEX MICROSCOPIC
Bilirubin Urine: NEGATIVE
Glucose, UA: NEGATIVE mg/dL
Hgb urine dipstick: NEGATIVE
Ketones, ur: NEGATIVE mg/dL
Nitrite: NEGATIVE
Protein, ur: NEGATIVE mg/dL
Specific Gravity, Urine: 1.017 (ref 1.005–1.030)
pH: 7 (ref 5.0–8.0)

## 2022-10-11 MED ORDER — CYCLOBENZAPRINE HCL 5 MG PO TABS
ORAL_TABLET | ORAL | Status: AC
  Administered 2022-10-11: 10 mg via ORAL
  Filled 2022-10-11: qty 2

## 2022-10-11 MED ORDER — ONDANSETRON 4 MG PO TBDP
8.0000 mg | ORAL_TABLET | Freq: Once | ORAL | Status: AC
  Administered 2022-10-11: 8 mg via ORAL
  Filled 2022-10-11: qty 2

## 2022-10-11 MED ORDER — CYCLOBENZAPRINE HCL 5 MG PO TABS: 10.0000 mg | ORAL_TABLET | Freq: Once | ORAL | Status: AC

## 2022-10-11 MED ORDER — ONDANSETRON 4 MG PO TBDP: 4.0000 mg | ORAL_TABLET | Freq: Three times a day (TID) | ORAL | 0 refills | Status: DC | PRN

## 2022-10-11 MED ORDER — CYCLOBENZAPRINE HCL 10 MG PO TABS: 10.0000 mg | ORAL_TABLET | Freq: Two times a day (BID) | ORAL | 0 refills | Status: DC | PRN

## 2022-10-11 NOTE — MAU Note (Signed)
..  Uri Gehring is a 22 y.o. at [redacted]w[redacted]d here in MAU reporting:  Lower abdominal cramps that began yesterday and worsened today, took 500 mg this afternoon and it didn't help.  Nausea & vomiting.  that began this morning, has vomited 3 times today.   Denies vaginal bleeding or leaking of fluid.  Reports no fetal movent the last two hours.   Pain score:5/10 Vitals:   10/11/22 2050  BP: 117/68  Pulse: 92  Resp: 18  Temp: 98.2 F (36.8 C)  SpO2: 99%     FHT:159 Lab orders placed from triage:  UA

## 2022-10-11 NOTE — MAU Note (Signed)
Unable to maintain continuous FHR tracing due to fetal activity

## 2022-10-11 NOTE — MAU Note (Signed)
Pt states nausea has resolved and back pain level has reduced to 3 on 1-10 scale.  Provider notified

## 2022-10-11 NOTE — MAU Provider Note (Signed)
History     CSN: [id number]  Arrival date and time: 10/11/22 2037   Event Date/Time   First Provider Initiated Contact with Patient 10/11/22 2252      Chief Complaint  Patient presents with   Abdominal Pain   HPI [name] is a 22 y.o. year old [id number] female at [redacted]w[redacted]d weeks gestation who presents to MAU reporting lower abdominal cramping since yesterday. She reports the cramping is worsening; rates the pain 5/10. She took Tylenol 500 mg without relief. She also reports N/V 3 times that started this AM. She denies VB or LOF. She reports no FM for the past 2 hours. She receives [facility]; next appt is 10/14/2022.  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   On adderall prior to pregnancy, stopped w/ +PT, doing well   Autism    Autism spectrum disorder 12/25/2010   Bacterial vaginosis 11/23/2019   Borderline personality disorder (HCC) 06/03/2018   Chronic left-sided low back pain without sciatica 02/06/2022   Chronic migraine with aura 09/27/2019   Chronic post-traumatic stress disorder 12/25/2010   Depression    Depression with anxiety 10/03/2015   07/20/22 No meds, doing well, IBH referral ordered   Episode of shaking 09/27/2019   Major depressive disorder 04/13/2017   PTSD (post-traumatic stress disorder)    Syncopal episodes     Past Surgical History:  Procedure Laterality Date   CAUTERIZE INNER NOSE     CERVICAL CERCLAGE N/A 04/14/2019   Procedure: CERCLAGE CERVICAL;  Surgeon: Noralee Space, MD;  Location: [facility] LD ORS;  Service: Gynecology;  Laterality: N/A;   DENTAL SURGERY     MOUTH SURGERY      WISDOM TOOTH EXTRACTION      Family History  Problem Relation Age of Onset   Depression Mother    Anxiety disorder Mother    ADD / ADHD Mother    Drug abuse Father    Depression Sister    Depression Maternal Grandmother    Jaundice Daughter     Social History   Tobacco Use   Smoking status: Never   Smokeless tobacco: Never  Vaping Use   Vaping status: Never Used  Substance Use Topics   Alcohol use: Not Currently    Comment: occ   Drug use: No    Allergies:  Allergies  Allergen Reactions   Latex Itching and Rash    Medications Prior to Admission  Medication Sig Dispense Refill Last Dose   acetaminophen (TYLENOL) 500 MG tablet Take 500 mg by mouth every 6 (six) hours as needed for mild pain or headache.   10/11/2022 at 1300   prenatal vitamin w/FE, FA (PRENATAL 1 + 1) 27-1 MG TABS tablet Take 1 tablet by mouth daily at 12 noon. 30 tablet 12 10/11/2022   progesterone (PROMETRIUM) 200 MG capsule Place 1 capsule (200 mg total) vaginally at  bedtime. 30 capsule 5 10/10/2022 at 2130   ADDERALL XR 10 MG 24 hr capsule Take 10 mg by mouth every morning. (Patient not taking: Reported on 08/04/2022)      albuterol (VENTOLIN HFA) 108 (90 Base) MCG/ACT inhaler Inhale 2 puffs into the lungs every 6 (six) hours as needed for wheezing or shortness of breath.      Doxylamine-Pyridoxine (DICLEGIS) 10-10 MG TBEC 2 tabs q hs, if sx persist add 1 tab q am on day 3, if sx persist add 1 tab q afternoon on day 4 100 tablet 6    famotidine (PEPCID) 20 MG tablet Take 1 tablet (20 mg total) by mouth 2 (two) times daily. 60 tablet 0     Review of Systems  Constitutional:  Positive for appetite change.  HENT: Negative.    Eyes: Negative.   Respiratory: Negative.    Cardiovascular: Negative.   Gastrointestinal: Negative.   Endocrine: Negative.   Genitourinary:  Positive for pelvic pain.  Musculoskeletal:  Positive for back pain.  Skin: Negative.   Allergic/Immunologic: Negative.    Neurological: Negative.   Hematological: Negative.   Psychiatric/Behavioral: Negative.     Physical Exam   Blood pressure 117/68, pulse 92, temperature 98.2 F (36.8 C), temperature source Oral, resp. rate 18, height 5' 1.5" (1.562 m), weight 75.6 kg, last menstrual period 04/26/2022, SpO2 95%.  Physical Exam Vitals and nursing note reviewed.  Constitutional:      Appearance: Normal appearance. She is normal weight.  Cardiovascular:     Rate and Rhythm: Normal rate.  Pulmonary:     Effort: Pulmonary effort is normal.  Abdominal:     Palpations: Abdomen is soft.  Genitourinary:    Comments: Deferred - no UCs on EFM Musculoskeletal:        General: Normal range of motion.  Skin:    General: Skin is warm and dry.  Neurological:     Mental Status: She is alert and oriented to person, place, and time.  Psychiatric:        Mood and Affect: Mood normal.        Behavior: Behavior normal.        Thought Content: Thought content normal.        Judgment: Judgment normal.    REASSURING NST - FHR: 150 bpm / moderate variability / accels present / decels absent / TOCO: none MAU Course  Procedures  MDM CCUA UCx -- Results pending  Zofran 8 mg ODT -- resolved N/V Flexeril 10 mg PO -- improved back pain  Results for orders placed or performed during the hospital encounter of 10/11/22 (from the past 24 hour(s))  Urinalysis, Routine w reflex microscopic -Urine, Clean Catch     Status: Abnormal   Collection Time: 10/11/22  9:41 PM  Result Value Ref Range   Color, Urine YELLOW YELLOW   APPearance CLOUDY (A) CLEAR   Specific Gravity, Urine 1.017 1.005 - 1.030   pH 7.0 5.0 - 8.0   Glucose, UA NEGATIVE NEGATIVE mg/dL   Hgb urine dipstick NEGATIVE NEGATIVE   Bilirubin Urine NEGATIVE NEGATIVE   Ketones, ur NEGATIVE NEGATIVE mg/dL   Protein, ur NEGATIVE NEGATIVE mg/dL   Nitrite NEGATIVE NEGATIVE   Leukocytes,Ua SMALL (A) NEGATIVE   RBC / HPF 0-5 0 - 5 RBC/hpf   WBC, UA 11-20 0 - 5  WBC/hpf   Bacteria, UA FEW (A) NONE SEEN   Squamous Epithelial / HPF 6-10 0 - 5 /HPF   Mucus PRESENT  Amorphous Crystal PRESENT     Assessment and Plan  1. Back pain affecting pregnancy in second trimester - Information provided on back pain in pregnancy - Rx: Flexeril 10 mg po every 12 hrs prn pain  2. Nausea/vomiting in pregnancy - Rx: Zofran 4 mg ODT every 8 hours prn N/V  3. [redacted] weeks gestation of pregnancy   - Discharge patient - Keep scheduled appt with [facility] on 10/14/2022 - Patient verbalized an understanding of the plan of care and agrees.  Raelyn Mora, CNM 10/11/2022, 10:52 PM

## 2022-10-12 ENCOUNTER — Ambulatory Visit (INDEPENDENT_AMBULATORY_CARE_PROVIDER_SITE_OTHER): Payer: MEDICAID | Admitting: Clinical

## 2022-10-12 DIAGNOSIS — F909 Attention-deficit hyperactivity disorder, unspecified type: Secondary | ICD-10-CM

## 2022-10-12 DIAGNOSIS — F840 Autistic disorder: Secondary | ICD-10-CM

## 2022-10-12 DIAGNOSIS — F4321 Adjustment disorder with depressed mood: Secondary | ICD-10-CM

## 2022-10-12 DIAGNOSIS — F4312 Post-traumatic stress disorder, chronic: Secondary | ICD-10-CM

## 2022-10-12 NOTE — Patient Instructions (Signed)
[facility] [address] [place], [place] [address] [phone] (main office) [phone] (Jamie's office)  [facility] (Individual and group grief support) [facility]  [phone]    

## 2022-10-13 ENCOUNTER — Other Ambulatory Visit: Payer: Medicaid Other

## 2022-10-13 ENCOUNTER — Encounter: Payer: Medicaid Other | Admitting: Obstetrics & Gynecology
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
## 2022-10-14 ENCOUNTER — Ambulatory Visit: Payer: MEDICAID | Admitting: Obstetrics & Gynecology

## 2022-10-14 ENCOUNTER — Ambulatory Visit: Payer: MEDICAID

## 2022-10-14 ENCOUNTER — Encounter: Payer: Self-pay | Admitting: Obstetrics & Gynecology

## 2022-10-14 VITALS — BP 121/79 | HR 89 | Wt 164.8 lb

## 2022-10-14 DIAGNOSIS — Z8751 Personal history of pre-term labor: Secondary | ICD-10-CM

## 2022-10-14 DIAGNOSIS — Z3481 Encounter for supervision of other normal pregnancy, first trimester: Secondary | ICD-10-CM

## 2022-10-14 DIAGNOSIS — O0992 Supervision of high risk pregnancy, unspecified, second trimester: Secondary | ICD-10-CM

## 2022-10-14 DIAGNOSIS — Z3A24 24 weeks gestation of pregnancy: Secondary | ICD-10-CM | POA: Diagnosis not present

## 2022-10-14 DIAGNOSIS — O09299 Supervision of pregnancy with other poor reproductive or obstetric history, unspecified trimester: Secondary | ICD-10-CM

## 2022-10-14 DIAGNOSIS — Z3480 Encounter for supervision of other normal pregnancy, unspecified trimester: Secondary | ICD-10-CM

## 2022-10-14 DIAGNOSIS — O26899 Other specified pregnancy related conditions, unspecified trimester: Secondary | ICD-10-CM

## 2022-10-14 DIAGNOSIS — Z8742 Personal history of other diseases of the female genital tract: Secondary | ICD-10-CM

## 2022-10-14 DIAGNOSIS — O09899 Supervision of other high risk pregnancies, unspecified trimester: Secondary | ICD-10-CM

## 2022-10-14 DIAGNOSIS — O09892 Supervision of other high risk pregnancies, second trimester: Secondary | ICD-10-CM | POA: Diagnosis not present

## 2022-10-14 DIAGNOSIS — Z9889 Other specified postprocedural states: Secondary | ICD-10-CM

## 2022-10-14 DIAGNOSIS — O09292 Supervision of pregnancy with other poor reproductive or obstetric history, second trimester: Secondary | ICD-10-CM

## 2022-10-14 NOTE — Progress Notes (Signed)
[place] 24+3 wks,cephalic,cx length 1.7-2.3 cm with and without pressure,no funneling,anterior fundal placenta gr 0,normal ovaries,FHR 159 bpm,SVP of fluid 5.5 cm

## 2022-10-14 NOTE — Progress Notes (Signed)
HIGH-RISK PREGNANCY VISIT Patient name: Audrey Matthews MRN [id number]  Date of birth: [birth year] Chief Complaint:   Routine Prenatal Visit  History of Present Illness:   Audrey Matthews is a 22 y.o. [phone] female at [redacted]w[redacted]d with an Estimated Date of Delivery: 01/31/23 being seen today for ongoing management of a high-risk pregnancy complicated by:  -h/o PPROM, cerclage- declined cerclage this pregnancy, using progesterone vaginally Rh neg  Today she reports no complaints.   Contractions: Not present. Vag. Bleeding: None.  Movement: Present. denies leaking of fluid.      07/20/2022   10:01 AM 11/05/2020    1:43 PM 02/09/2019    9:50 AM 12/27/2018    1:48 PM  Depression screen PHQ 2/9  Decreased Interest 0 1 0 1  Down, Depressed, Hopeless 0 1 0 1  PHQ - 2 Score 0 2 0 2  Altered sleeping 1 1 1  0  Tired, decreased energy 1 1 1 1   Change in appetite 2 0 0 2  Feeling bad or failure about yourself  0 1 0 1  Trouble concentrating 1 1 2  0  Moving slowly or fidgety/restless 0 0 3 1  Suicidal thoughts 0 0 0 0  PHQ-9 Score 5 6 7 7   Difficult doing work/chores   Somewhat difficult      Current Outpatient Medications  Medication Instructions   acetaminophen (TYLENOL) 500 mg, Oral, Every 6 hours PRN   albuterol (VENTOLIN HFA) 108 (90 Base) MCG/ACT inhaler 2 puffs, Inhalation, Every 6 hours PRN   cyclobenzaprine (FLEXERIL) 10 mg, Oral, 2 times daily PRN   Doxylamine-Pyridoxine (DICLEGIS) 10-10 MG TBEC 2 tabs q hs, if sx persist add 1 tab q am on day 3, if sx persist add 1 tab q afternoon on day 4   famotidine (PEPCID) 20 mg, Oral, 2 times daily   ondansetron (ZOFRAN-ODT) 4 mg, Oral, Every 8 hours PRN   prenatal vitamin w/FE, FA (PRENATAL 1 + 1) 27-1 MG TABS tablet 1 tablet, Oral, Daily   progesterone (PROMETRIUM) 200 mg, Vaginal, Daily at bedtime     Review of Systems:   Pertinent items are noted in HPI Denies abnormal vaginal discharge w/ itching/odor/irritation, headaches, visual  changes, shortness of breath, chest pain, abdominal pain, severe nausea/vomiting, or problems with urination or bowel movements unless otherwise stated above. Pertinent History Reviewed:  Reviewed past medical,surgical, social, obstetrical and family history.  Reviewed problem list, medications and allergies. Physical Assessment:   Vitals:   10/14/22 1513  BP: 121/79  Pulse: 89  Weight: 164 lb 12.8 oz (74.8 kg)  Body mass index is 30.63 kg/m.           Physical Examination:   General appearance: alert, well appearing, and in no distress  Mental status: normal mood, behavior, speech, dress, motor activity, and thought processes  Skin: warm & dry   Extremities:      Cardiovascular: normal heart rate noted  Respiratory: normal respiratory effort, no distress  Abdomen: gravid, soft, non-tender  Pelvic: Cervical exam deferred         Fetal Status:     Movement: Present    Fetal Surveillance Testing today: cephalic,cx length 1.7-2.3 cm with and without pressure,no funneling,anterior fundal placenta gr 0,normal ovaries,FHR 159 bpm,SVP of fluid 5.5 cm    Chaperone: N/A    No results found for this or any previous visit (from the past 24 hour(s)).   Assessment & Plan:  High-risk pregnancy: [id number] at [redacted]w[redacted]d with an Estimated Date of  Delivery: 01/31/23   1) h.o PPROM likely due to cervical shortening -Continue vaginal progesterone -While cervix continues to shorten no further management is indicated at this time -Reviewed preterm labor precautions  2) O neg  Meds: No orders of the defined types were placed in this encounter.   Labs/procedures today: ultrasound as above  Treatment Plan:  routine OB care, PN2 next visit  Reviewed: Preterm labor symptoms and general obstetric precautions including but not limited to vaginal bleeding, contractions, leaking of fluid and fetal movement were reviewed in detail with the patient.  All questions were answered. Pt has home bp cuff. Check  bp weekly, let us know if >140/90.   Follow-up: Return in about 3 weeks (around 11/04/2022) for HROB visit and PN-2.   Future Appointments  Date Time Provider Department Center  10/26/2022  3:45 PM [facility] HEALTH CLINICIAN [facility]  11/04/2022  8:30 AM [facility]  11/04/2022  9:10 AM Myna Hidalgo, DO [facility]    No orders of the defined types were placed in this encounter.   Myna Hidalgo, DO Attending Obstetrician & Gynecologist, [facility] for [employer], [facility]

## 2022-10-26 ENCOUNTER — Ambulatory Visit (INDEPENDENT_AMBULATORY_CARE_PROVIDER_SITE_OTHER): Payer: MEDICAID | Admitting: Clinical

## 2022-10-26 DIAGNOSIS — F909 Attention-deficit hyperactivity disorder, unspecified type: Secondary | ICD-10-CM

## 2022-10-26 DIAGNOSIS — F4312 Post-traumatic stress disorder, chronic: Secondary | ICD-10-CM

## 2022-10-26 DIAGNOSIS — F840 Autistic disorder: Secondary | ICD-10-CM

## 2022-10-26 NOTE — Patient Instructions (Signed)
[facility] at [facility] [address] [place], [place] [address] [phone] (main office) [phone] ('s office)  High Risk Pregnancy Support [email or web address]  Sidelines: High-Risk Pregnancy Support [email or web address]  HER Foundation [email or web address]

## 2022-10-27 NOTE — BH Specialist Note (Signed)
Integrated Behavioral Health via Telemedicine Visit  11/10/2022 Audrey Matthews [id number]  Number of Integrated Behavioral Health Clinician visits: 6-Sixth Visit  Session Start time: 1546   Session End time: 1618  Total time in minutes: 32   Referring Provider: Shawna Clamp, CNM Patient/Family location: [facility] Provider location: [facility]  All persons participating in visit: [name] and Audrey Matthews   Types of Service: Individual psychotherapy and Video visit  I connected with Audrey Matthews and/or Audrey Matthews's  n/a  via  Telephone or Video Enabled Telemedicine Application  (Video is Caregility application) and verified that I am speaking with the correct person using two identifiers. Discussed confidentiality: Yes   I discussed the limitations of telemedicine and the availability of in person appointments.  Discussed there is a possibility of technology failure and discussed alternative modes of communication if that failure occurs.  I discussed that engaging in this telemedicine visit, they consent to the provision of behavioral healthcare and the services will be billed under their insurance.  Patient and/or legal guardian expressed understanding and consented to Telemedicine visit: Yes   Presenting Concerns: Patient and/or family reports the following symptoms/concerns: Stress of being in non-active labor ("lost mucus plug, dilated 2cm"), feeling "drained" after standing 5 minutes; falls asleep after eating; goal is to make it to at least baby shower on 12/05/22 before water breaks.  Duration of problem: Less than one week; Severity of problem: moderate  Patient and/or Family's Strengths/Protective Factors: Social connections, Concrete supports in place (healthy food, safe environments, etc.), and Sense of purpose  Goals Addressed: Patient will:  Reduce symptoms of: anxiety and stress    Increase knowledge and/or ability of: stress reduction   Demonstrate ability to: Increase healthy adjustment to current life circumstances  Progress towards Goals: Ongoing  Interventions: Interventions utilized:  Solution-Focused Strategies and Supportive Reflection Standardized Assessments completed: Not Needed  Patient and/or Family Response: Patient agrees with treatment plan.   Assessment: Patient currently experiencing Autism spectrum disorder; Post-traumatic stress disorder; ADHD.   Patient may benefit from continued therapeutic intervention.  Plan: Follow up with behavioral health clinician on : Three weeks Behavioral recommendations:  -Continue taking prenatal vitamin daily; follow medical recommendations (no lifting over 5 lbs, minimize stress, cervical restrictions, etc.) -Continue prioritizing healthy self-care daily and being mindful of body signals (hunger, thirst, temperature, fatigue, emotions); maintaining kindness to self.  -Continue working on sewing projects in bed as calming distraction daily Referral(s): Integrated [employer] (In Clinic)  I discussed the assessment and treatment plan with the patient and/or parent/guardian. They were provided an opportunity to ask questions and all were answered. They agreed with the plan and demonstrated an understanding of the instructions.   They were advised to call back or seek an in-person evaluation if the symptoms worsen or if the condition fails to improve as anticipated.  [name] Daiya Tamer, LCSW

## 2022-11-01 ENCOUNTER — Other Ambulatory Visit: Payer: Self-pay

## 2022-11-01 ENCOUNTER — Encounter (HOSPITAL_COMMUNITY): Payer: Self-pay | Admitting: Obstetrics & Gynecology

## 2022-11-01 ENCOUNTER — Inpatient Hospital Stay (HOSPITAL_COMMUNITY)
Admission: AD | Admit: 2022-11-01 | Discharge: 2022-11-03 | DRG: 833 | Disposition: A | Discharge status: Home / Self Care | Payer: MEDICAID | Attending: Obstetrics and Gynecology | Admitting: Obstetrics and Gynecology

## 2022-11-01 DIAGNOSIS — M25551 Pain in right hip: Secondary | ICD-10-CM | POA: Diagnosis present

## 2022-11-01 DIAGNOSIS — O4700 False labor before 37 completed weeks of gestation, unspecified trimester: Secondary | ICD-10-CM | POA: Diagnosis not present

## 2022-11-01 DIAGNOSIS — Z6791 Unspecified blood type, Rh negative: Secondary | ICD-10-CM

## 2022-11-01 DIAGNOSIS — O3432 Maternal care for cervical incompetence, second trimester: Secondary | ICD-10-CM

## 2022-11-01 DIAGNOSIS — Z3A27 27 weeks gestation of pregnancy: Secondary | ICD-10-CM | POA: Diagnosis not present

## 2022-11-01 DIAGNOSIS — M25552 Pain in left hip: Secondary | ICD-10-CM | POA: Diagnosis present

## 2022-11-01 DIAGNOSIS — Z91040 Latex allergy status: Secondary | ICD-10-CM

## 2022-11-01 DIAGNOSIS — O26892 Other specified pregnancy related conditions, second trimester: Secondary | ICD-10-CM | POA: Diagnosis present

## 2022-11-01 DIAGNOSIS — R102 Pelvic and perineal pain: Secondary | ICD-10-CM | POA: Diagnosis present

## 2022-11-01 DIAGNOSIS — O4702 False labor before 37 completed weeks of gestation, second trimester: Secondary | ICD-10-CM | POA: Diagnosis not present

## 2022-11-01 DIAGNOSIS — Z8742 Personal history of other diseases of the female genital tract: Secondary | ICD-10-CM

## 2022-11-01 DIAGNOSIS — O3433 Maternal care for cervical incompetence, third trimester: Principal | ICD-10-CM

## 2022-11-01 LAB — TYPE AND SCREEN
ABO/RH(D): O NEG
Antibody Screen: NEGATIVE

## 2022-11-01 LAB — URINALYSIS, ROUTINE W REFLEX MICROSCOPIC
Bilirubin Urine: NEGATIVE
Glucose, UA: NEGATIVE mg/dL
Hgb urine dipstick: NEGATIVE
Ketones, ur: NEGATIVE mg/dL
Nitrite: NEGATIVE
Protein, ur: NEGATIVE mg/dL
Specific Gravity, Urine: 1.016 (ref 1.005–1.030)
pH: 7 (ref 5.0–8.0)

## 2022-11-01 MED ORDER — DOCUSATE SODIUM 100 MG PO CAPS
100.0000 mg | ORAL_CAPSULE | Freq: Every day | ORAL | Status: DC
  Administered 2022-11-02: 100 mg via ORAL
  Filled 2022-11-01 (×2): qty 1

## 2022-11-01 MED ORDER — ACETAMINOPHEN 325 MG PO TABS
650.0000 mg | ORAL_TABLET | ORAL | Status: DC | PRN
  Administered 2022-11-02 (×2): 650 mg via ORAL
  Filled 2022-11-01 (×2): qty 2

## 2022-11-01 MED ORDER — LACTATED RINGERS IV SOLN: 125.0000 mL/h | INTRAVENOUS | Status: AC

## 2022-11-01 MED ORDER — PRENATAL MULTIVITAMIN CH
1.0000 | ORAL_TABLET | Freq: Every day | ORAL | Status: DC
  Administered 2022-11-03: 1 via ORAL
  Filled 2022-11-01: qty 1

## 2022-11-01 MED ORDER — LACTATED RINGERS IV BOLUS
1000.0000 mL | Freq: Once | INTRAVENOUS | Status: AC
  Administered 2022-11-01: 1000 mL via INTRAVENOUS

## 2022-11-01 MED ORDER — LOPERAMIDE HCL 2 MG PO CAPS: 2.0000 mg | ORAL_CAPSULE | ORAL | Status: DC | PRN

## 2022-11-01 MED ORDER — DIPHENHYDRAMINE HCL 25 MG PO CAPS
25.0000 mg | ORAL_CAPSULE | Freq: Four times a day (QID) | ORAL | Status: DC | PRN
  Administered 2022-11-02: 25 mg via ORAL
  Filled 2022-11-01: qty 1

## 2022-11-01 MED ORDER — BETAMETHASONE SOD PHOS & ACET 6 (3-3) MG/ML IJ SUSP
12.0000 mg | INTRAMUSCULAR | Status: DC
  Administered 2022-11-02: 12 mg via INTRAMUSCULAR

## 2022-11-01 MED ORDER — BETAMETHASONE SOD PHOS & ACET 6 (3-3) MG/ML IJ SUSP
12.0000 mg | Freq: Once | INTRAMUSCULAR | Status: AC
  Administered 2022-11-01: 12 mg via INTRAMUSCULAR
  Filled 2022-11-01: qty 5

## 2022-11-01 MED ORDER — CALCIUM CARBONATE ANTACID 500 MG PO CHEW
2.0000 | CHEWABLE_TABLET | ORAL | Status: DC | PRN
  Administered 2022-11-02 – 2022-11-03 (×5): 400 mg via ORAL
  Filled 2022-11-01 (×5): qty 2

## 2022-11-01 NOTE — H&P (Signed)
FACULTY PRACTICE ANTEPARTUM ADMISSION HISTORY AND PHYSICAL NOTE   History of Present Illness: Audrey Matthews is a 22 y.o. [id number] at [redacted]w[redacted]d admitted for Preterm labor.   Hx of cervical insufficiency & 28 week delivery after PPROM.   Current symptoms started this morning. Reports bilateral hip/pelvic pain that is intermittent & occurs 2-3 times per hour. Has had 4 episodes of water stools since this morning. Noticed increase in mucoid discharge. Denies fever/chills, n/v, dysuria, vaginal bleeding, recent intercourse, or sick contacts. Reports good fetal movement.   Patient reports the fetal movement as active. Patient reports uterine contraction  activity as 2-3 times per hour. Patient reports  vaginal bleeding as none. Patient describes fluid per vagina as None. Fetal presentation is cephalic.  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   On adderall prior to pregnancy, stopped w/ +PT, doing well   Autism spectrum disorder 12/25/2010   Borderline personality disorder (HCC) 06/03/2018   Chronic left-sided low back pain without sciatica 02/06/2022   Chronic migraine with aura 09/27/2019   Chronic post-traumatic stress disorder 12/25/2010   Depression with anxiety 10/03/2015   07/20/22 No meds, doing well, [facility] referral ordered   Episode of shaking 09/27/2019   Major depressive disorder 04/13/2017   PTSD (post-traumatic stress disorder)     Syncopal episodes     Past Surgical History:  Procedure Laterality Date   CAUTERIZE INNER NOSE     CERVICAL CERCLAGE N/A 04/14/2019   Procedure: CERCLAGE CERVICAL;  Surgeon: Noralee Space, MD;  Location: [facility];  Service: Gynecology;  Laterality: N/A;   DENTAL SURGERY     MOUTH SURGERY     WISDOM TOOTH EXTRACTION      OB History  Gravida Para Term Preterm AB Living  3 1   1 1 1   SAB IAB Ectopic Multiple Live Births  1     0 1    # Outcome Date GA Lbr Len/2nd Weight Sex Type Anes PTL Lv  3 Current           2 SAB 06/2021          1 Preterm 06/01/19 [redacted]w[redacted]d 01:38 / 00:06 1110 g F Vag-Spont None  LIV     Complications: Short cervix with cervical cerclage in second trimester, antepartum, Preterm premature rupture of membranes    Social History   Socioeconomic History   Marital status: Married    Spouse name: Not on file   Number of children: 1   Years of education: Not on file   Highest education level: Not on file  Occupational History   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   She has one daughter - born March 2021 (three month premature).   Right-handed.   No daily caffeine use.   Social Determinants of Health   Financial Resource Strain: Low Risk  (07/20/2022)   Overall Financial Resource Strain (CARDIA)    Difficulty of Paying Living Expenses: Not hard at all  Food Insecurity: No Food Insecurity (07/20/2022)   Hunger Vital Sign    Worried About Running Out of Food in the Last Year: Never true    Ran Out of Food in the Last Year: Never true  Transportation  Needs: No Transportation Needs (07/20/2022)   PRAPARE - Administrator, Civil Service (Medical): No    Lack of Transportation (Non-Medical): No  Physical Activity: Insufficiently Active (07/20/2022)   Exercise Vital Sign    Days of Exercise per Week: 2 days    Minutes of Exercise per Session: 20 min  Stress: Stress Concern Present (07/20/2022)   [employer] of Occupational Health - Occupational Stress Questionnaire    Feeling of Stress : To some extent  Social Connections: Moderately Integrated (07/20/2022)   Social Connection and Isolation Panel [NHANES]    Frequency of Communication with Friends and Family: More than three times a week    Frequency of Social Gatherings with Friends and Family: Twice a week    Attends Religious Services: 1 to 4 times per year    Active Member of [employer] or Organizations: No    Attends Banker Meetings: Never    Marital Status: Married    Family History  Problem Relation Age of Onset   Depression Mother    Anxiety disorder Mother    ADD / ADHD Mother    Drug abuse Father    Depression Sister    Depression Maternal Grandmother    Jaundice Daughter     Allergies  Allergen Reactions   Latex Itching and Rash    Medications Prior to Admission  Medication Sig Dispense Refill Last Dose   cyclobenzaprine (FLEXERIL) 10 MG tablet Take 1 tablet (10 mg total) by mouth 2 (two) times daily as needed. 20 tablet 0 10/31/2022   doxylamine, Sleep, (UNISOM) 25 MG tablet Take 25 mg by mouth at bedtime as needed.   10/31/2022   prenatal vitamin w/FE, FA (PRENATAL 1 + 1) 27-1 MG TABS tablet Take 1 tablet by mouth daily at 12 noon. 30 tablet 12 11/01/2022   progesterone (PROMETRIUM) 200 MG capsule Place 1 capsule (200 mg total) vaginally at bedtime. 30 capsule 5 10/31/2022   acetaminophen (TYLENOL) 500 MG tablet Take 500 mg by mouth every 6 (six) hours as needed for mild pain or headache.      albuterol (VENTOLIN HFA) 108 (90 Base) MCG/ACT  inhaler Inhale 2 puffs into the lungs every 6 (six) hours as needed for wheezing or shortness of breath.      Doxylamine-Pyridoxine (DICLEGIS) 10-10 MG TBEC 2 tabs q hs, if sx persist add 1 tab q am on day 3, if sx persist add 1 tab q afternoon on day 4 (Patient not taking: Reported on 10/14/2022) 100 tablet 6    famotidine (PEPCID) 20 MG tablet Take 1 tablet (20 mg total) by mouth 2 (two) times daily. 60 tablet 0    ondansetron (ZOFRAN-ODT) 4 MG disintegrating tablet Take 1 tablet (4 mg total) by mouth  every 8 (eight) hours as needed for nausea or vomiting. (Patient not taking: Reported on 10/14/2022) 15 tablet 0     Review of Systems - Negative except pelvic pain & vaginal discharge  Vitals:  BP 122/68 (BP Location: Right Arm)   Pulse (!) 103   Temp 98.2 F (36.8 C) (Oral)   Resp 16   Ht 5\' 1"  (1.549 m)   Wt 76.4 kg   LMP 04/26/2022   SpO2 100%   BMI 31.82 kg/m  Physical Examination: CONSTITUTIONAL: Well-developed, well-nourished female in no acute distress.  HENT:  Normocephalic, atraumatic, External right and left ear normal. Oropharynx is clear and moist EYES: Conjunctivae and EOM are normal. Pupils are equal, round, and reactive to light. No scleral icterus.  NECK: Normal range of motion, supple, no masses SKIN: Skin is warm and dry. No rash noted. Not diaphoretic. No erythema. No pallor. NEUROLGIC: Alert and oriented to person, place, and time. Normal reflexes, muscle tone coordination. No cranial nerve deficit noted. PSYCHIATRIC: Normal mood and affect. Normal behavior. Normal judgment and thought content. CARDIOVASCULAR: Normal heart rate noted, regular rhythm RESPIRATORY: Effort and breath sounds normal, no problems with respiration noted ABDOMEN: Soft, nontender, nondistended, gravid. MUSCULOSKELETAL: Normal range of motion. No edema and no tenderness. 2+ distal pulses.  Cervix: Dilation: 2.5 Effacement (%): 50 Cervical Position: Middle Station: Ballotable Presentation:  Vertex Exam by:: Judeth Horn NP Membranes:intact Fetal Monitoring:Baseline: 150 bpm, Variability: Good {> 6 bpm), Accelerations: Reactive, and Decelerations: Absent Tocometer: Flat  Labs:  Results for orders placed or performed during the hospital encounter of 11/01/22 (from the past 24 hour(s))  Urinalysis, Routine w reflex microscopic -Urine, Clean Catch   Collection Time: 11/01/22  3:52 PM  Result Value Ref Range   Color, Urine YELLOW YELLOW   APPearance CLOUDY (A) CLEAR   Specific Gravity, Urine 1.016 1.005 - 1.030   pH 7.0 5.0 - 8.0   Glucose, UA NEGATIVE NEGATIVE mg/dL   Hgb urine dipstick NEGATIVE NEGATIVE   Bilirubin Urine NEGATIVE NEGATIVE   Ketones, ur NEGATIVE NEGATIVE mg/dL   Protein, ur NEGATIVE NEGATIVE mg/dL   Nitrite NEGATIVE NEGATIVE   Leukocytes,Ua MODERATE (A) NEGATIVE   RBC / HPF 0-5 0 - 5 RBC/hpf   WBC, UA 11-20 0 - 5 WBC/hpf   Bacteria, UA RARE (A) NONE SEEN   Squamous Epithelial / HPF 21-50 0 - 5 /HPF   Mucus PRESENT    Amorphous Crystal PRESENT     Imaging Studies: No results found.   Assessment and Plan: 1. Premature cervical dilation in third trimester   2. H/O cervical incompetence   3. Threatened preterm labor, antepartum   4. [redacted] weeks gestation of pregnancy    -Admit to antepartum -Monitor for labor -First BMZ given 8/11 @ 1747  Judeth Horn, NP 11/01/2022 7:36 PM

## 2022-11-01 NOTE — MAU Note (Signed)
.  Audrey Matthews is a 22 y.o. at [redacted]w[redacted]d here in MAU reporting: loss of mucous plug and bilateral hip pain that comes and goes since 1100 this morning   Onset of complaint: 1100 Pain score: 2/10 Vitals:   11/01/22 1533  BP: 122/68  Pulse: (!) 103  Resp: 16  Temp: 98.2 F (36.8 C)  SpO2: 100%     FHT:145 Lab orders placed from triage:

## 2022-11-01 NOTE — MAU Provider Note (Signed)
History     [id number]  Arrival date and time: 11/01/22 1516    Chief Complaint  Patient presents with   Hip Pain   Vaginal Discharge     HPI Audrey Matthews is a 22 y.o. at [redacted]w[redacted]d who presents for pelvic/hip pain. Hx of 28 week delivery after PPROM.   Current symptoms started this morning. Reports bilateral hip/pelvic pain that is intermittent & occurs 2-3 times per hour. Has had 4 episodes of water stools since this morning. Noticed increase in mucoid discharge. Denies fever/chills, n/v, dysuria, vaginal bleeding, recent intercourse, or sick contacts. Reports good fetal movement.   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   On adderall prior to pregnancy, stopped w/ +PT, doing well   Autism spectrum disorder 12/25/2010   Borderline personality disorder (HCC) 06/03/2018   Chronic left-sided low back pain without sciatica 02/06/2022   Chronic migraine with aura 09/27/2019   Chronic post-traumatic stress disorder 12/25/2010   Depression with anxiety 10/03/2015   07/20/22 No meds, doing well, [facility] referral ordered   Episode of shaking 09/27/2019   Major depressive disorder 04/13/2017   PTSD (post-traumatic stress disorder)    Syncopal episodes     Past Surgical History:  Procedure Laterality Date   CAUTERIZE INNER NOSE     CERVICAL CERCLAGE N/A 04/14/2019   Procedure: CERCLAGE CERVICAL;  Surgeon: Noralee Space, MD;  Location: [facility] LD [facility]S;  Service: Gynecology;  Laterality: N/A;   DENTAL SURGERY     MOUTH SURGERY     WISDOM TOOTH EXTRACTION      Family History  Problem Relation Age of Onset   Depression Mother     Anxiety disorder Mother    ADD / ADHD Mother    Drug abuse Father    Depression Sister    Depression Maternal Grandmother    Jaundice Daughter     Allergies  Allergen Reactions   Latex Itching and Rash    No current facility-administered medications on file prior to encounter.   Current Outpatient Medications on File Prior to Encounter  Medication Sig Dispense Refill   cyclobenzaprine (FLEXERIL) 10 MG tablet Take 1 tablet (10 mg total) by mouth 2 (two) times daily as needed. 20 tablet 0   doxylamine, Sleep, (UNISOM) 25 MG tablet Take 25 mg by mouth at bedtime as needed.     prenatal vitamin w/FE, FA (PRENATAL 1 + 1) 27-1 MG TABS tablet Take 1 tablet by mouth daily at 12 noon. 30 tablet 12   progesterone (PROMETRIUM) 200 MG capsule Place 1 capsule (200 mg total) vaginally at bedtime. 30 capsule 5   acetaminophen (TYLENOL) 500 MG tablet Take 500 mg by mouth every 6 (six) hours as needed for mild pain or headache.     albuterol (VENTOLIN HFA) 108 (90 Base) MCG/ACT inhaler Inhale 2 puffs into the lungs every 6 (six) hours as needed for  wheezing or shortness of breath.     Doxylamine-Pyridoxine (DICLEGIS) 10-10 MG TBEC 2 tabs q hs, if sx persist add 1 tab q am on day 3, if sx persist add 1 tab q afternoon on day 4 (Patient not taking: Reported on 10/14/2022) 100 tablet 6   famotidine (PEPCID) 20 MG tablet Take 1 tablet (20 mg total) by mouth 2 (two) times daily. 60 tablet 0   ondansetron (ZOFRAN-ODT) 4 MG disintegrating tablet Take 1 tablet (4 mg total) by mouth every 8 (eight) hours as needed for nausea or vomiting. (Patient not taking: Reported on 10/14/2022) 15 tablet 0     ROS Pertinent positives and negative per HPI, all others reviewed and negative  Physical Exam   BP 122/68 (BP Location: Right Arm)   Pulse (!) 103   Temp 98.2 F (36.8 C) (Oral)   Resp 16   Ht 5\' 1"  (1.549 m)   Wt 76.4 kg   LMP 04/26/2022   SpO2 100%   BMI 31.82 kg/m   Patient Vitals for the past 24  hrs:  BP Temp Temp src Pulse Resp SpO2 Height Weight  11/01/22 1533 122/68 98.2 F (36.8 C) Oral (!) 103 16 100 % -- --  11/01/22 1531 -- -- -- -- -- -- 5\' 1"  (1.549 m) 76.4 kg    Physical Exam Vitals and nursing note reviewed. Exam conducted with a chaperone present.  Constitutional:      General: She is not in acute distress.    Appearance: Normal appearance.  HENT:     Head: Normocephalic and atraumatic.  Eyes:     General: No scleral icterus.    Conjunctiva/sclera: Conjunctivae normal.  Genitourinary:    General: Normal vulva.     Comments: Small amount of mucoid discharge. No pooling of fluid or vaginal bleeding.  Skin:    General: Skin is warm and dry.  Neurological:     Mental Status: She is alert.  Psychiatric:        Mood and Affect: Mood normal.        Behavior: Behavior normal.      Cervical Exam Dilation: 2.5 Effacement (%): 50 Cervical Position: Middle Station: Ballotable Presentation: Vertex Exam by:: Judeth Horn NP  Bedside Ultrasound Pt informed that the ultrasound is considered a limited OB ultrasound and is not intended to be a complete ultrasound exam.  Patient also informed that the ultrasound is not being completed with the intent of assessing for fetal or placental anomalies or any pelvic abnormalities.  Explained that the purpose of today's ultrasound is to assess for  presentation.  Patient acknowledges the purpose of the exam and the limitations of the study.   My interpretation: cephalic  FHT Baseline 150, moderate variability, 15x15 accels, no decels Toco: none Cat: 1  Labs Results for orders placed or performed during the hospital encounter of 11/01/22 (from the past 24 hour(s))  Urinalysis, Routine w reflex microscopic -Urine, Clean Catch     Status: Abnormal   Collection Time: 11/01/22  3:52 PM  Result Value Ref Range   Color, Urine YELLOW YELLOW   APPearance CLOUDY (A) CLEAR   Specific Gravity, Urine 1.016 1.005 - 1.030   pH 7.0  5.0 - 8.0   Glucose, UA NEGATIVE NEGATIVE mg/dL   Hgb urine dipstick NEGATIVE NEGATIVE   Bilirubin Urine NEGATIVE NEGATIVE   Ketones, ur NEGATIVE NEGATIVE mg/dL   Protein, ur NEGATIVE NEGATIVE mg/dL   Nitrite NEGATIVE NEGATIVE   Leukocytes,Ua MODERATE (A) NEGATIVE  RBC / HPF 0-5 0 - 5 RBC/hpf   WBC, UA 11-20 0 - 5 WBC/hpf   Bacteria, UA RARE (A) NONE SEEN   Squamous Epithelial / HPF 21-50 0 - 5 /HPF   Mucus PRESENT    Amorphous Crystal PRESENT     Imaging No results found.  MAU Course  Procedures Lab Orders         Culture, OB Urine         Culture, beta strep (group b only)         Urinalysis, Routine w reflex microscopic -Urine, Clean Catch         Fetal fibronectin         Urinalysis, Routine w reflex microscopic -Urine, Clean Catch    Meds ordered this encounter  Medications   lactated ringers bolus 1,000 mL   loperamide (IMODIUM) capsule 2 mg   betamethasone acetate-betamethasone sodium phosphate (CELESTONE) injection 12 mg   lactated ringers infusion   acetaminophen (TYLENOL) tablet 650 mg   docusate sodium (COLACE) capsule 100 mg   calcium carbonate (TUMS - dosed in mg elemental calcium) chewable tablet 400 mg of elemental calcium   prenatal multivitamin tablet 1 tablet   betamethasone acetate-betamethasone sodium phosphate (CELESTONE) injection 12 mg   Imaging Orders  No imaging studies ordered today    MDM Given IV fluid bolus & first dose of BMZ BSUS to confirm presentation Recommend admission for preterm dilation  Assessment and Plan   1. Premature cervical dilation in third trimester   2. H/O cervical incompetence   3. Threatened preterm labor, antepartum   4. [redacted] weeks gestation of pregnancy    -Plan to admit to antepartum to monitor for labor given hx of cervical insufficiency.  -Complete BMZ series in 24 hours  Judeth Horn, NP 11/01/22 7:35 PM

## 2022-11-02 DIAGNOSIS — O4700 False labor before 37 completed weeks of gestation, unspecified trimester: Secondary | ICD-10-CM | POA: Diagnosis not present

## 2022-11-02 DIAGNOSIS — Z3A27 27 weeks gestation of pregnancy: Secondary | ICD-10-CM | POA: Diagnosis not present

## 2022-11-02 MED ORDER — PROGESTERONE 200 MG PO CAPS
200.0000 mg | ORAL_CAPSULE | Freq: Every day | ORAL | Status: DC
  Administered 2022-11-02: 200 mg via VAGINAL
  Filled 2022-11-02: qty 1

## 2022-11-02 NOTE — Progress Notes (Signed)
FACULTY PRACTICE ANTEPARTUM PROGRESS NOTE  Audrey Matthews is a 22 y.o. [id number] at [redacted]w[redacted]d who is admitted for advanced cervical dilation.  Estimated Date of Delivery: 01/31/23 Fetal presentation is cephalic.  Length of Stay:  1 Days. Admitted 11/01/2022  Subjective: Pt resting quietly. Patient reports normal fetal movement.  She denies uterine contractions, denies bleeding and leaking of fluid per vagina.  Vitals:  Blood pressure 115/68, pulse 79, temperature 98.4 F (36.9 C), temperature source Oral, resp. rate 17, height 5\' 1"  (1.549 m), weight 76.4 kg, last menstrual period 04/26/2022, SpO2 99%. Physical Examination: CONSTITUTIONAL: Well-developed, well-nourished female in no acute distress.  HENT:  Normocephalic, atraumatic, External right and left ear normal. Oropharynx is clear and moist EYES: Conjunctivae and EOM are normal.  NECK: Normal range of motion, supple, no masses. SKIN: Skin is warm and dry. No rash noted. Not diaphoretic. No erythema. No pallor. NEUROLGIC: Alert and oriented to person, place, and time. Normal reflexes, muscle tone coordination. No cranial nerve deficit noted. PSYCHIATRIC: Normal mood and affect. Normal behavior. Normal judgment and thought content. CARDIOVASCULAR: Normal heart rate noted, regular rhythm RESPIRATORY: Effort and breath sounds normal, no problems with respiration noted MUSCULOSKELETAL: Normal range of motion. No edema and no tenderness. ABDOMEN: Soft, nontender, nondistended, gravid. CERVIX: deferred  Fetal monitoring: FHR: 130s bpm, Variability: moderate, Accelerations: Present, Decelerations: Absent  Uterine activity: none   Results for orders placed or performed during the hospital encounter of 11/01/22 (from the past 48 hour(s))  Urinalysis, Routine w reflex microscopic -Urine, Clean Catch     Status: Abnormal   Collection Time: 11/01/22  3:52 PM  Result Value Ref Range   Color, Urine YELLOW YELLOW   APPearance CLOUDY (A) CLEAR    Specific Gravity, Urine 1.016 1.005 - 1.030   pH 7.0 5.0 - 8.0   Glucose, UA NEGATIVE NEGATIVE mg/dL   Hgb urine dipstick NEGATIVE NEGATIVE   Bilirubin Urine NEGATIVE NEGATIVE   Ketones, ur NEGATIVE NEGATIVE mg/dL   Protein, ur NEGATIVE NEGATIVE mg/dL   Nitrite NEGATIVE NEGATIVE   Leukocytes,Ua MODERATE (A) NEGATIVE   RBC / HPF 0-5 0 - 5 RBC/hpf   WBC, UA 11-20 0 - 5 WBC/hpf   Bacteria, UA RARE (A) NONE SEEN   Squamous Epithelial / HPF 21-50 0 - 5 /HPF   Mucus PRESENT    Amorphous Crystal PRESENT     Comment: Performed at [facility], [address] [place], [place] [address]  Type and screen [facility]     Status: None   Collection Time: 11/01/22  9:08 PM  Result Value Ref Range   ABO/RH(D) O NEG    Antibody Screen NEG    Sample Expiration      11/04/2022,2359 Performed at [facility], [address], [place], [place] [address]     I have reviewed the patient's current medications.  ASSESSMENT: Principal Problem:   Threatened preterm labor, antepartum   PLAN: Pt is s/p BMZ x 1, second administration will be at or around 1730 Pt not currently on magnesium, will start if clinical presentation is more worrisome for neuroprotection. Consider recheck cvx on 8/13, if unchanged possible discharge home   Continue routine antenatal care.   Mariel Aloe, MD [facility] Faculty Attending, [facility] 11/02/2022 7:48 AM

## 2022-11-02 NOTE — Progress Notes (Deleted)
Pt called out asking for RN. When Rn entered room pt was getting dressed and said " Im ready to go". RN asked if everything was okay and if staff had provided all needs. Pt said "staff gave great care but she can't stay here anymore" Rn informed pt that the doctor will be notified and that we needed to deaccess her port-a-cath. Pt stated "I already took it out" MD to be notified as well as IV team for further information on what this RN needs to do.

## 2022-11-03 ENCOUNTER — Other Ambulatory Visit: Payer: Self-pay | Admitting: Women's Health

## 2022-11-03 DIAGNOSIS — Z3A27 27 weeks gestation of pregnancy: Secondary | ICD-10-CM | POA: Diagnosis not present

## 2022-11-03 DIAGNOSIS — O4702 False labor before 37 completed weeks of gestation, second trimester: Secondary | ICD-10-CM | POA: Diagnosis not present

## 2022-11-03 LAB — CULTURE, BETA STREP (GROUP B ONLY)

## 2022-11-03 MED ORDER — FAMOTIDINE 20 MG PO TABS
20.0000 mg | ORAL_TABLET | Freq: Every day | ORAL | Status: DC
  Administered 2022-11-03: 20 mg via ORAL
  Filled 2022-11-03: qty 1

## 2022-11-03 NOTE — Discharge Summary (Signed)
Antenatal Physician Discharge Summary  Patient ID: Audrey Matthews MRN: [id number] DOB/AGE: 2000/06/09 22 y.o.  Admit date: 11/01/2022 Discharge date: 11/03/2022  Admission Diagnoses: Threatened preterm labor, antepartum [O47.00]  Discharge Diagnoses:  Advanced cervical dilation  Prenatal Procedures: NST  Consults: Neonatology, Maternal Fetal Medicine  Hospital Course:  Audrey Matthews is a 22 y.o. [id number] at [redacted]w[redacted]d admitted for advanced cervical dilation.    Presented to [facility] with 1 day of hip/pelvic pain and found to have SCE 2.5/50/ballotable. She has a history of cervical insufficiency with PPROM/PTB at 28 weeks. She received BMZ 8/11-12. Vaginal progesterone was continued. She was observed, fetal heart rate monitoring remained reassuring, and she had no signs/symptoms of progressing preterm labor or other maternal-fetal concerns.   On day of discharge, she was asymptomatic with no contractions, bleeding, or LOF. Speculum exam was performed and cervix was visually closed. SCE was deferred given her gestational age.   She was discharged home in stable condition. Strict return precautions were reviewed. She will follow up in 1 day for her scheduled ROB.   Discharge Exam: Temp:  [98.2 F (36.8 C)-98.3 F (36.8 C)] 98.2 F (36.8 C) (08/13 1149) Pulse Rate:  [74-97] 97 (08/13 1149) Resp:  [17-18] 17 (08/13 1149) BP: (105-113)/(56-67) 113/63 (08/13 1149) SpO2:  [98 %-100 %] 99 % (08/13 1149) Physical Examination: CONSTITUTIONAL: Well-developed, well-nourished female in no acute distress.  CARDIOVASCULAR: Normal heart rate noted RESPIRATORY: Effort normal, no problems with respiration noted ABDOMEN: Soft, nontender, nondistended, gravid. CERVIX: Dilation: Closed Effacement (%): 50 Cervical Position: Middle Station: Ballotable Presentation: Vertex Exam by:: Dr. Anola Gurney  Fetal monitoring: FHR: 150 bpm, Variability: moderate, Accelerations: Present (10x10), Decelerations:  Rare variables Uterine activity: flat  Significant Diagnostic Studies:  Results for orders placed or performed during the hospital encounter of 11/01/22 (from the past 168 hour(s))  Culture, OB Urine   Collection Time: 11/01/22  3:52 PM   Specimen: OB Clean Catch; Urine  Result Value Ref Range   Specimen Description OB CLEAN CATCH    Special Requests Normal    Culture (A)     MULTIPLE SPECIES PRESENT, SUGGEST RECOLLECTION NO GROUP B STREP (S.AGALACTIAE) ISOLATED Performed at [facility], [address], [place], [place] [address]    Report Status 11/02/2022 FINAL   Urinalysis, Routine w reflex microscopic -Urine, Clean Catch   Collection Time: 11/01/22  3:52 PM  Result Value Ref Range   Color, Urine YELLOW YELLOW   APPearance CLOUDY (A) CLEAR   Specific Gravity, Urine 1.016 1.005 - 1.030   pH 7.0 5.0 - 8.0   Glucose, UA NEGATIVE NEGATIVE mg/dL   Hgb urine dipstick NEGATIVE NEGATIVE   Bilirubin Urine NEGATIVE NEGATIVE   Ketones, ur NEGATIVE NEGATIVE mg/dL   Protein, ur NEGATIVE NEGATIVE mg/dL   Nitrite NEGATIVE NEGATIVE   Leukocytes,Ua MODERATE (A) NEGATIVE   RBC / HPF 0-5 0 - 5 RBC/hpf   WBC, UA 11-20 0 - 5 WBC/hpf   Bacteria, UA RARE (A) NONE SEEN   Squamous Epithelial / HPF 21-50 0 - 5 /HPF   Mucus PRESENT    Amorphous Crystal PRESENT   Culture, beta strep (group b only)   Collection Time: 11/01/22  8:03 PM   Specimen: Vaginal/Rectal; Genital  Result Value Ref Range   Specimen Description VAGINAL/RECTAL    Special Requests NONE    Culture      NO GROUP B STREP (S.AGALACTIAE) ISOLATED Performed at [facility], [address] [place], [place] [address]  Report Status 11/03/2022 FINAL   Type and screen [facility]   Collection Time: 11/01/22  9:08 PM  Result Value Ref Range   ABO/RH(D) O NEG    Antibody Screen NEG    Sample Expiration      11/04/2022,2359 Performed at [facility], [address] [place], [place]  [address]    US OB Transvaginal  Result Date: 10/15/2022 Table formatting from the original result was not included. Images from the original result were not included.  ..an [employer] of Ultrasound Medicine Technical sales engineer) accredited practice Center for [facility] [address] [place] [address] Ordering Provider: Cheral Marker, CNM FOLLOW UP SONOGRAM Audrey Matthews is in the office for a follow up sonogram for cervical length. She is a 22 y.o. year old [id number] with Estimated Date of Delivery: 01/31/23 by early ultrasound now at  [redacted]w[redacted]d weeks gestation. Thus far the pregnancy has been complicated by short cx,H/O cervical incompetence . GESTATION: SINGLETON PRESENTATION: cephalic FETAL ACTIVITY:          Heart rate         159          The fetus is active. AMNIOTIC FLUID: The amniotic fluid volume is  normal, 5.5 cm.SVP PLACENTA LOCALIZATION:  fundal, anterior GRADE 0 CERVIX: Measures 1.7-2.3 cm with and w/o pressure ADNEXA: The ovaries are normal. GESTATIONAL AGE AND  BIOMETRICS: Gestational criteria: Estimated Date of Delivery: 01/31/23 by early ultrasound now at [redacted]w[redacted]d Previous Scans:6 ANATOMICAL SURVEY                                                                            COMMENTS CEREBRAL VENTRICLES yes normal  CHOROID PLEXUS yes normal  CEREBELLUM yes normal  CISTERNA MAGNA  Yes  normal   CAVUM SEPTI PELLUCIDI YES NORMAL                      4 CHAMBERED HEART yes normal  OUTFLOW TRACTS YES normaL  3VV YES NORMAL  3VTV YES NORMAL  SITUS YES NORMAL      DIAPHRAGM yes normal  STOMACH yes normal  RENAL REGION yes normal  BLADDER yes normal          3 VESSEL CORD yes normal              GENITALIA yes normal female     SUSPECTED ABNORMALITIES:  yes QUALITY OF SCAN: satisfactory TECHNICIAN COMMENTS: US TV/TA 24+3 wks,cephalic,cx length 1.7-2.3 cm with and without pressure,no funneling,anterior fundal placenta gr 0,normal ovaries,FHR 159 bpm,SVP of fluid 5.5 cm A copy of this  report including all images has been saved and backed up to a second source for retrieval if needed. All measures and details of the anatomical scan, placentation, fluid volume and pelvic anatomy are contained in that report. Amber Flora Lipps 10/14/2022 3:14 PM Clinical Impression and recommendations: I have reviewed the sonogram results above, combined with the patient's current clinical course, below are my impressions and any appropriate recommendations for management based on the sonographic findings. 1.  [id number] Estimated Date of Delivery: 01/31/23 by serial sonographic evaluations 2.  Fetal sonographic surveillance findings: a). Normal  fluid volume b). Cervical length 1.7-2.3 cm with and without pressure,no funneling 3.  Normal general sonographic findings Prior ultrasounds reviewed.  Cervical length has slowly decreased; however, no further managaement would be indicated.  Plan to continue with vaginal progesterone.  Recommend continued prenatal evaluations and care based on this sonogram and as clinically indicated from the patient's clinical course. Myna Hidalgo, DO Attending Obstetrician & Gynecologist, [facility] for [facility], [facility]    [employer]  Result Date: 10/15/2022 Table formatting from the original result was not included. Images from the original result were not included.  ..an [employer] of Ultrasound Medicine Technical sales engineer) accredited practice Center for [facility] @ [facility] [address] [place] [address] Ordering Provider: Cheral Marker, CNM FOLLOW UP SONOGRAM Audrey Matthews is in the office for a follow up sonogram for cervical length. She is a 22 y.o. year old G3P0111 with Estimated Date of Delivery: 01/31/23 by early ultrasound now at  [redacted]w[redacted]d weeks gestation. Thus far the pregnancy has been complicated by short cx,H/O cervical incompetence . GESTATION: SINGLETON PRESENTATION: cephalic FETAL ACTIVITY:          Heart rate          159          The fetus is active. AMNIOTIC FLUID: The amniotic fluid volume is  normal, 5.5 cm.SVP PLACENTA LOCALIZATION:  fundal, anterior GRADE 0 CERVIX: Measures 1.7-2.3 cm with and w/o pressure ADNEXA: The ovaries are normal. GESTATIONAL AGE AND  BIOMETRICS: Gestational criteria: Estimated Date of Delivery: 01/31/23 by early ultrasound now at [redacted]w[redacted]d Previous Scans:6 ANATOMICAL SURVEY                                                                            COMMENTS CEREBRAL VENTRICLES yes normal  CHOROID PLEXUS yes normal  CEREBELLUM yes normal  CISTERNA MAGNA  Yes  normal   CAVUM SEPTI PELLUCIDI YES NORMAL                      4 CHAMBERED HEART yes normal  OUTFLOW TRACTS YES normaL  3VV YES NORMAL  3VTV YES NORMAL  SITUS YES NORMAL      DIAPHRAGM yes normal  STOMACH yes normal  RENAL REGION yes normal  BLADDER yes normal          3 VESSEL CORD yes normal              GENITALIA yes normal female     SUSPECTED ABNORMALITIES:  yes QUALITY OF SCAN: satisfactory TECHNICIAN COMMENTS: US TV/TA 24+3 wks,cephalic,cx length 1.7-2.3 cm with and without pressure,no funneling,anterior fundal placenta gr 0,normal ovaries,FHR 159 bpm,SVP of fluid 5.5 cm A copy of this report including all images has been saved and backed up to a second source for retrieval if needed. All measures and details of the anatomical scan, placentation, fluid volume and pelvic anatomy are contained in that report. Amber Flora Lipps 10/14/2022 3:14 PM Clinical Impression and recommendations: I have reviewed the sonogram results above, combined with the patient's current clinical course, below are my impressions and any appropriate recommendations for management based on the sonographic findings. 1.  [id number] Estimated  Date of Delivery: 01/31/23 by serial sonographic evaluations 2.  Fetal sonographic surveillance findings: a). Normal fluid volume b). Cervical length 1.7-2.3 cm with and without pressure,no funneling 3.  Normal general sonographic  findings Prior ultrasounds reviewed.  Cervical length has slowly decreased; however, no further managaement would be indicated.  Plan to continue with vaginal progesterone.  Recommend continued prenatal evaluations and care based on this sonogram and as clinically indicated from the patient's clinical course. Myna Hidalgo, DO Attending Obstetrician & Gynecologist, [facility] for [facility], [facility]     Future Appointments  Date Time Provider Department Center  11/04/2022  8:30 AM Arabella Merles, CNM [facility]  11/10/2022  3:45 PM [facility]  11/18/2022  8:30 AM [facility]    Discharge Condition: Stable  Discharge disposition: 01-Home or Self Care       Discharge Instructions     Call MD for:   Complete by: As directed    Contractions, new/worsening pain, leaking fluid like your water broke, vaginal bleeding   Diet - low sodium heart healthy   Complete by: As directed    Increase activity slowly   Complete by: As directed       Allergies as of 11/03/2022       Reactions   Latex Itching, Rash        Medication List     STOP taking these medications    cyclobenzaprine 10 MG tablet Commonly known as: FLEXERIL   Doxylamine-Pyridoxine 10-10 MG Tbec Commonly known as: Diclegis   ondansetron 4 MG disintegrating tablet Commonly known as: ZOFRAN-ODT       TAKE these medications    acetaminophen 500 MG tablet Commonly known as: TYLENOL Take 500 mg by mouth every 6 (six) hours as needed for mild pain or headache.   albuterol 108 (90 Base) MCG/ACT inhaler Commonly known as: VENTOLIN HFA Inhale 2 puffs into the lungs every 6 (six) hours as needed for wheezing or shortness of breath.   doxylamine (Sleep) 25 MG tablet Commonly known as: UNISOM Take 25 mg by mouth at bedtime as needed.   prenatal vitamin w/FE, FA 27-1 MG Tabs tablet Take 1 tablet by mouth daily at 12  noon.   progesterone 200 MG capsule Commonly known as: Prometrium Place 1 capsule (200 mg total) vaginally at bedtime.        Follow-up Information     [facility] for [facility] at [facility] Follow up on 11/04/2022.   Specialty: Obstetrics and Gynecology Why: Please keep your scheduled OB appointment. We will reschedule your blood sugar test Contact information: [address] [place] [address] [phone]                Total discharge time: 30 minutes   Signed: Lennart Pall M.D. 11/03/2022, 8:34 PM

## 2022-11-04 ENCOUNTER — Other Ambulatory Visit: Payer: MEDICAID

## 2022-11-04 ENCOUNTER — Ambulatory Visit (INDEPENDENT_AMBULATORY_CARE_PROVIDER_SITE_OTHER): Payer: MEDICAID | Admitting: Advanced Practice Midwife

## 2022-11-04 ENCOUNTER — Encounter: Payer: Self-pay | Admitting: Advanced Practice Midwife

## 2022-11-04 ENCOUNTER — Encounter: Payer: MEDICAID | Admitting: Obstetrics & Gynecology

## 2022-11-04 VITALS — BP 124/75 | HR 85 | Wt 169.0 lb

## 2022-11-04 DIAGNOSIS — Z3A27 27 weeks gestation of pregnancy: Secondary | ICD-10-CM

## 2022-11-04 DIAGNOSIS — Z302 Encounter for sterilization: Secondary | ICD-10-CM | POA: Insufficient documentation

## 2022-11-04 DIAGNOSIS — Z3A26 26 weeks gestation of pregnancy: Secondary | ICD-10-CM

## 2022-11-04 DIAGNOSIS — Z3480 Encounter for supervision of other normal pregnancy, unspecified trimester: Secondary | ICD-10-CM

## 2022-11-04 DIAGNOSIS — Z3482 Encounter for supervision of other normal pregnancy, second trimester: Secondary | ICD-10-CM

## 2022-11-04 DIAGNOSIS — Z9079 Acquired absence of other genital organ(s): Secondary | ICD-10-CM | POA: Insufficient documentation

## 2022-11-04 DIAGNOSIS — Z131 Encounter for screening for diabetes mellitus: Secondary | ICD-10-CM

## 2022-11-04 NOTE — Patient Instructions (Addendum)
Audrey Matthews, I greatly value your feedback.  If you receive a survey following your visit with [place] today, we appreciate you taking the time to fill it out.  Thanks, Philipp Deputy, CNM   You will have your sugar test next visit.  Please do not eat or drink anything after midnight the night before you come, not even water.  You will be here for at least two hours.  Please make an appointment online for the bloodwork at [email or web address] for 8:30am (or as close to this as possible). Make sure you select the [facility] service center. The day of the appointment, check in with our office first, then you will go to [facility] to start the sugar test.    [facility] HAS MOVED!!! It is now [facility] at [facility] ([address] [place], [place] [address]) Entrance C, located off of [address] [employer] [address] valet parking  Go to [email or web address] to register for FREE online childbirth classes   Call the office [phone]) or go to [facility] if: You begin to have strong, frequent contractions Your water breaks.  Sometimes it is a big gush of fluid, sometimes it is just a trickle that keeps getting your panties wet or running down your legs You have vaginal bleeding.  It is normal to have a small amount of spotting if your cervix was checked.  You don't feel your baby moving like normal.  If you don't, get you something to eat and drink and lay down and focus on feeling your baby move.   If your baby is still not moving like normal, you should call the office or go to [facility].  [place] Pediatricians/Family Doctors: [facility] [phone]           [facility] [phone]                [facility] [phone] (usually not accepting new patients unless you have family there already, you are always welcome to call and ask)      [facility] Department [phone]       [facility] Pediatricians/Family Doctors:  [facility]: [phone] [facility]: [phone] [facility]: [phone]  [facility] Doctors:  [facility]: [phone]  [facility]: [phone]  [facility] Doctors: [facility]: [phone]   Home Blood Pressure Monitoring for Patients   Your provider has recommended that you check your blood pressure (BP) at least once a week at home. If you do not have a blood pressure cuff at home, one will be provided for you. Contact your provider if you have not received your monitor within 1 week.   Helpful Tips for Accurate Home Blood Pressure Checks  Don't smoke, exercise, or drink caffeine 30 minutes before checking your BP Use the restroom before checking your BP (a full bladder can raise your pressure) Relax in a comfortable upright chair Feet on the ground Left arm resting comfortably on a flat surface at the level of your heart Legs uncrossed Back supported Sit quietly and don't talk Place the cuff on your bare arm Adjust snuggly, so that only two fingertips can fit between your skin and the top of the cuff Check 2 readings separated by at least one minute Keep a log of your BP readings For a visual, please reference this diagram: http://ccnc.care/bpdiagram  Provider Name: [facility]     Phone: [phone]  Zone 1: ALL CLEAR  Continue to monitor your symptoms:  BP reading is less than 140 (top number) or less than 90 (bottom number)  No right upper stomach pain No headaches or seeing spots No feeling nauseated or throwing up No swelling in face and hands  Zone 2: CAUTION Call your doctor's office for any of the following:  BP reading is greater than 140 (top number) or greater than 90 (bottom number)  Stomach pain under your ribs in the middle or right side Headaches or seeing spots Feeling nauseated or throwing up Swelling in face and hands  Zone 3: EMERGENCY  Seek  immediate medical care if you have any of the following:  BP reading is greater than160 (top number) or greater than 110 (bottom number) Severe headaches not improving with Tylenol Serious difficulty catching your breath Any worsening symptoms from Zone 2   Second Trimester of Pregnancy The second trimester is from week 13 through week 28, months 4 through 6. The second trimester is often a time when you feel your best. Your body has also adjusted to being pregnant, and you begin to feel better physically. Usually, morning sickness has lessened or quit completely, you may have more energy, and you may have an increase in appetite. The second trimester is also a time when the fetus is growing rapidly. At the end of the sixth month, the fetus is about 9 inches long and weighs about 1 pounds. You will likely begin to feel the baby move (quickening) between 18 and 20 weeks of the pregnancy. BODY CHANGES Your body goes through many changes during pregnancy. The changes vary from woman to woman.  Your weight will continue to increase. You will notice your lower abdomen bulging out. You may begin to get stretch marks on your hips, abdomen, and breasts. You may develop headaches that can be relieved by medicines approved by your health care provider. You may urinate more often because the fetus is pressing on your bladder. You may develop or continue to have heartburn as a result of your pregnancy. You may develop constipation because certain hormones are causing the muscles that push waste through your intestines to slow down. You may develop hemorrhoids or swollen, bulging veins (varicose veins). You may have back pain because of the weight gain and pregnancy hormones relaxing your joints between the bones in your pelvis and as a result of a shift in weight and the muscles that support your balance. Your breasts will continue to grow and be tender. Your gums may bleed and may be sensitive to brushing  and flossing. Dark spots or blotches (chloasma, mask of pregnancy) may develop on your face. This will likely fade after the baby is born. A dark line from your belly button to the pubic area (linea nigra) may appear. This will likely fade after the baby is born. You may have changes in your hair. These can include thickening of your hair, rapid growth, and changes in texture. Some women also have hair loss during or after pregnancy, or hair that feels dry or thin. Your hair will most likely return to normal after your baby is born. WHAT TO EXPECT AT YOUR PRENATAL VISITS During a routine prenatal visit: You will be weighed to make sure you and the fetus are growing normally. Your blood pressure will be taken. Your abdomen will be measured to track your baby's growth. The fetal heartbeat will be listened to. Any test results from the previous visit will be discussed. Your health care provider  may ask you: How you are feeling. If you are feeling the baby move. If you have had any abnormal symptoms, such as leaking fluid, bleeding, severe headaches, or abdominal cramping. If you have any questions. Other tests that may be performed during your second trimester include: Blood tests that check for: Low iron levels (anemia). Gestational diabetes (between 24 and 28 weeks). Rh antibodies. Urine tests to check for infections, diabetes, or protein in the urine. An ultrasound to confirm the proper growth and development of the baby. An amniocentesis to check for possible genetic problems. Fetal screens for spina bifida and Down syndrome. HOME CARE INSTRUCTIONS  Avoid all smoking, herbs, alcohol, and unprescribed drugs. These chemicals affect the formation and growth of the baby. Follow your health care provider's instructions regarding medicine use. There are medicines that are either safe or unsafe to take during pregnancy. Exercise only as directed by your health care provider. Experiencing  uterine cramps is a good sign to stop exercising. Continue to eat regular, healthy meals. Wear a good support bra for breast tenderness. Do not use hot tubs, steam rooms, or saunas. Wear your seat belt at all times when driving. Avoid raw meat, uncooked cheese, cat litter boxes, and soil used by cats. These carry germs that can cause birth defects in the baby. Take your prenatal vitamins. Try taking a stool softener (if your health care provider approves) if you develop constipation. Eat more high-fiber foods, such as fresh vegetables or fruit and whole grains. Drink plenty of fluids to keep your urine clear or pale yellow. Take warm sitz baths to soothe any pain or discomfort caused by hemorrhoids. Use hemorrhoid cream if your health care provider approves. If you develop varicose veins, wear support hose. Elevate your feet for 15 minutes, 3-4 times a day. Limit salt in your diet. Avoid heavy lifting, wear low heel shoes, and practice good posture. Rest with your legs elevated if you have leg cramps or low back pain. Visit your dentist if you have not gone yet during your pregnancy. Use a soft toothbrush to brush your teeth and be gentle when you floss. A sexual relationship may be continued unless your health care provider directs you otherwise. Continue to go to all your prenatal visits as directed by your health care provider. SEEK MEDICAL CARE IF:  You have dizziness. You have mild pelvic cramps, pelvic pressure, or nagging pain in the abdominal area. You have persistent nausea, vomiting, or diarrhea. You have a bad smelling vaginal discharge. You have pain with urination. SEEK IMMEDIATE MEDICAL CARE IF:  You have a fever. You are leaking fluid from your vagina. You have spotting or bleeding from your vagina. You have severe abdominal cramping or pain. You have rapid weight gain or loss. You have shortness of breath with chest pain. You notice sudden or extreme swelling of your face,  hands, ankles, feet, or legs. You have not felt your baby move in over an hour. You have severe headaches that do not go away with medicine. You have vision changes. Document Released: 03/03/2001 Document Revised: 03/14/2013 Document Reviewed: 05/10/2012 [facility] Patient Information 2015 [place], [place]. This information is not intended to replace advice given to you by your health care provider. Make sure you discuss any questions you have with your health care provider.

## 2022-11-04 NOTE — Progress Notes (Signed)
HIGH-RISK PREGNANCY VISIT Patient name: Ruthellen Bemiss MRN [id number]  Date of birth: 17-May-2000 Chief Complaint:   Routine Prenatal Visit (Hips hurting)  History of Present Illness:   Eirini Mcclurkin is a 22 y.o. [phone] female at [redacted]w[redacted]d with an Estimated Date of Delivery: 01/31/23 being seen today for ongoing management of a high-risk pregnancy complicated by hx PPROM with d/c yesterday from [facility] admission 8/11-8/13. Still having hip discomfort; reports occ ctx; no bldg/leaking. Had SSE with visual closed cx yesterday prior to d/c. Still using vag progesterone q hs.   Today she reports  hip pain . Contractions: Irregular. Vag. Bleeding: None.  Movement: Present. denies leaking of fluid.      07/20/2022   10:01 AM 11/05/2020    1:43 PM 02/09/2019    9:50 AM 12/27/2018    1:48 PM  Depression screen PHQ 2/9  Decreased Interest 0 1 0 1  Down, Depressed, Hopeless 0 1 0 1  PHQ - 2 Score 0 2 0 2  Altered sleeping 1 1 1  0  Tired, decreased energy 1 1 1 1   Change in appetite 2 0 0 2  Feeling bad or failure about yourself  0 1 0 1  Trouble concentrating 1 1 2  0  Moving slowly or fidgety/restless 0 0 3 1  Suicidal thoughts 0 0 0 0  PHQ-9 Score 5 6 7 7   Difficult doing work/chores   Somewhat difficult         07/20/2022   10:01 AM 11/05/2020    1:43 PM  GAD 7 : Generalized Anxiety Score  Nervous, Anxious, on Edge 1 1  Control/stop worrying 2 1  Worry too much - different things 1 1  Trouble relaxing 1 1  Restless 0 1  Easily annoyed or irritable 2 2  Afraid - awful might happen 1 1  Total GAD 7 Score 8 8     Review of Systems:   Pertinent items are noted in HPI Denies abnormal vaginal discharge w/ itching/odor/irritation, headaches, visual changes, shortness of breath, chest pain, abdominal pain, severe nausea/vomiting, or problems with urination or bowel movements unless otherwise stated above. Pertinent History Reviewed:  Reviewed past medical,surgical, social, obstetrical and  family history.  Reviewed problem list, medications and allergies. Physical Assessment:   Vitals:   11/04/22 0848  BP: 124/75  Pulse: 85  Weight: 169 lb (76.7 kg)  Body mass index is 31.93 kg/m.           Physical Examination:   General appearance: alert, well appearing, and in no distress  Mental status: alert, oriented to person, place, and time  Skin: warm & dry   Extremities: Edema: None    Cardiovascular: normal heart rate noted  Respiratory: normal respiratory effort, no distress  Abdomen: gravid, soft, non-tender  Pelvic: Cervical exam deferred         Fetal Status: Fetal Heart Rate (bpm): 155 Fundal Height: 27 cm Movement: Present    Fetal Surveillance Testing today: doppler    No results found for this or any previous visit (from the past 24 hour(s)).  Assessment & Plan:  High-risk pregnancy: G3P0111 at [redacted]w[redacted]d with an Estimated Date of Delivery: 01/31/23   1) Hx PPROM, preterm dilation (2.5cm) but stable as far as symptoms; using nightly progesterone  2) Wants salpingectomy, understands the regret factor, but the PTL/PTD is very taxing; will sign 30d papers today  3) Rh neg, will get Rhogam ~30wks    Meds: No orders of the defined types  were placed in this encounter.   Labs/procedures today: none  Treatment Plan:  q 2wk visits; cx exams per symptoms  Reviewed: Preterm labor symptoms and general obstetric precautions including but not limited to vaginal bleeding, contractions, leaking of fluid and fetal movement were reviewed in detail with the patient.  All questions were answered. Does have home bp cuff. Office bp cuff given: not applicable. Check bp weekly, let us know if consistently >140 and/or >90.  Follow-up: Return for pls add in HROB to 8/28 PN2 visit (it looks like it's just a lab visit), Sign BTL consent today.   Future Appointments  Date Time Provider Department Center  11/10/2022  3:45 PM [facility]IC[facility] ([facility]) [facility]  11/18/2022   8:30 AM [facility]    No orders of the defined types were placed in this encounter.  Arabella Merles CNM 11/04/2022 9:11 AM

## 2022-11-05 ENCOUNTER — Encounter (HOSPITAL_COMMUNITY): Payer: Self-pay | Admitting: Obstetrics & Gynecology

## 2022-11-05 ENCOUNTER — Other Ambulatory Visit: Payer: Self-pay

## 2022-11-05 ENCOUNTER — Inpatient Hospital Stay (HOSPITAL_COMMUNITY)
Admission: AD | Admit: 2022-11-05 | Discharge: 2022-11-05 | Disposition: A | Discharge status: Home / Self Care | Payer: MEDICAID | Attending: Obstetrics & Gynecology | Admitting: Obstetrics & Gynecology

## 2022-11-05 DIAGNOSIS — Z1152 Encounter for screening for COVID-19: Secondary | ICD-10-CM | POA: Diagnosis not present

## 2022-11-05 DIAGNOSIS — Z3A27 27 weeks gestation of pregnancy: Secondary | ICD-10-CM | POA: Diagnosis not present

## 2022-11-05 DIAGNOSIS — O26892 Other specified pregnancy related conditions, second trimester: Secondary | ICD-10-CM | POA: Diagnosis not present

## 2022-11-05 DIAGNOSIS — O99012 Anemia complicating pregnancy, second trimester: Secondary | ICD-10-CM | POA: Diagnosis not present

## 2022-11-05 DIAGNOSIS — O26812 Pregnancy related exhaustion and fatigue, second trimester: Secondary | ICD-10-CM

## 2022-11-05 DIAGNOSIS — R102 Pelvic and perineal pain: Secondary | ICD-10-CM | POA: Diagnosis not present

## 2022-11-05 LAB — CBC
HCT: 32.1 % — ABNORMAL LOW (ref 36.0–46.0)
Hemoglobin: 10.4 g/dL — ABNORMAL LOW (ref 12.0–15.0)
MCH: 27.1 pg (ref 26.0–34.0)
MCHC: 32.4 g/dL (ref 30.0–36.0)
MCV: 83.6 fL (ref 80.0–100.0)
Platelets: 307 10*3/uL (ref 150–400)
RBC: 3.84 MIL/uL — ABNORMAL LOW (ref 3.87–5.11)
RDW: 13.4 % (ref 11.5–15.5)
WBC: 12.1 10*3/uL — ABNORMAL HIGH (ref 4.0–10.5)
nRBC: 0 % (ref 0.0–0.2)

## 2022-11-05 LAB — GLUCOSE, CAPILLARY: Glucose-Capillary: 105 mg/dL — ABNORMAL HIGH (ref 70–99)

## 2022-11-05 LAB — URINALYSIS, ROUTINE W REFLEX MICROSCOPIC
Bilirubin Urine: NEGATIVE
Glucose, UA: NEGATIVE mg/dL
Hgb urine dipstick: NEGATIVE
Ketones, ur: NEGATIVE mg/dL
Nitrite: NEGATIVE
Protein, ur: NEGATIVE mg/dL
Specific Gravity, Urine: 1.023 (ref 1.005–1.030)
pH: 6 (ref 5.0–8.0)

## 2022-11-05 LAB — SARS CORONAVIRUS 2 BY RT PCR: SARS Coronavirus 2 by RT PCR: NEGATIVE

## 2022-11-05 MED ORDER — FERROUS SULFATE 325 (65 FE) MG PO TBEC: 325.0000 mg | DELAYED_RELEASE_TABLET | ORAL | 2 refills | Status: DC

## 2022-11-05 NOTE — MAU Provider Note (Addendum)
History    No chief complaint on file.  Audrey Matthews is a 22yo [id number] at [redacted]w[redacted]d gestation who presents to the MAU with 24 hours of fatigue, mild confusion and disorientation, hot spells and chills, headache, and increased pelvic pressure. She reports she has had increased heart rate, muscle fatigue, and body aches that have limited her physical activity. She has felt groggy and confused. She is oriented to person, place, and date but has been losing track of time of day. She has also had intermittent sweats and has felt hot without objective fever with home temp reads of 73F. She reports slightly increased pelvic pressure with normal fetal movements. She reports no vaginal bleeding or leakage, congestion, rhinorrhea, pharyngitis, cough, or dyspnea.    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   On adderall prior to pregnancy, stopped w/ +PT, doing well   Autism spectrum disorder 12/25/2010   Borderline personality disorder (HCC) 06/03/2018   Chronic left-sided low back pain without sciatica 02/06/2022   Chronic migraine with aura 09/27/2019   Chronic post-traumatic stress disorder 12/25/2010   Depression with anxiety 10/03/2015   07/20/22 No meds, doing well, IBH referral ordered   Episode of shaking 09/27/2019   Major depressive disorder 04/13/2017   PTSD (post-traumatic stress disorder)    Syncopal episodes     Past Surgical History:  Procedure Laterality Date   CAUTERIZE INNER NOSE     CERVICAL CERCLAGE N/A 04/14/2019   Procedure: CERCLAGE CERVICAL;  Surgeon: Noralee Space, MD;  Location: [facility] LD ORS;  Service: Gynecology;   Laterality: N/A;   DENTAL SURGERY     MOUTH SURGERY     WISDOM TOOTH EXTRACTION      Family History  Problem Relation Age of Onset   Depression Mother    Anxiety disorder Mother    ADD / ADHD Mother    Drug abuse Father    Depression Sister    Depression Maternal Grandmother    Jaundice Daughter     Social History   Tobacco Use   Smoking status: Never   Smokeless tobacco: Never  Vaping Use   Vaping status: Never Used  Substance Use Topics   Alcohol use: Not Currently    Comment: occ   Drug use: No    Allergies:  Allergies  Allergen Reactions   Latex Itching and Rash    Medications Prior to Admission  Medication Sig Dispense Refill Last Dose   famotidine (PEPCID) 20 MG tablet TAKE ONE TABLET TWICE DAILY 60 tablet 1 11/05/2022   prenatal vitamin w/FE, FA (PRENATAL 1 + 1) 27-1 MG TABS tablet Take 1 tablet by mouth daily at 12 noon. 30 tablet 12 11/05/2022   progesterone (PROMETRIUM) 200 MG capsule Place 1 capsule (200 mg total) vaginally at bedtime. 30 capsule 5 11/04/2022   acetaminophen (TYLENOL) 500 MG tablet Take 500 mg by mouth every 6 (  six) hours as needed for mild pain or headache.      albuterol (VENTOLIN HFA) 108 (90 Base) MCG/ACT inhaler Inhale 2 puffs into the lungs every 6 (six) hours as needed for wheezing or shortness of breath. (Patient not taking: Reported on 11/04/2022)      doxylamine, Sleep, (UNISOM) 25 MG tablet Take 25 mg by mouth at bedtime as needed.       Review of Systems  Constitutional:  Positive for activity change, chills and fatigue. Negative for fever.  HENT:  Negative for congestion, rhinorrhea and sore throat.   Eyes:  Negative for visual disturbance.  Respiratory:  Negative for cough, shortness of breath and wheezing.   Cardiovascular:  Negative for chest pain and leg swelling.  Musculoskeletal:  Positive for myalgias.   Physical Exam Blood pressure 117/69, pulse (!) 107, temperature 98.4 F (36.9 C), temperature source Oral, resp.  rate 16, last menstrual period 04/26/2022, SpO2 95%. Physical Exam Constitutional:      General: She is not in acute distress.    Appearance: She is not ill-appearing.  HENT:     Nose: No congestion or rhinorrhea.     Mouth/Throat:     Mouth: Mucous membranes are dry.     Pharynx: Oropharynx is clear. No oropharyngeal exudate or posterior oropharyngeal erythema.  Eyes:     Extraocular Movements: Extraocular movements intact.     Conjunctiva/sclera: Conjunctivae normal.     Pupils: Pupils are equal, round, and reactive to light.  Cardiovascular:     Rate and Rhythm: Normal rate and regular rhythm.     Pulses: Normal pulses.     Heart sounds: Normal heart sounds. No murmur heard.    No friction rub. No gallop.  Pulmonary:     Effort: Pulmonary effort is normal.     Breath sounds: Normal breath sounds. No wheezing, rhonchi or rales.  Genitourinary:    Vagina: No vaginal discharge.  Skin:    General: Skin is warm and dry.     Coloration: Skin is not jaundiced or pale.  Neurological:     General: No focal deficit present.     Mental Status: She is alert and oriented to person, place, and time.     MAU Course - Urinalysis: normal - CBG: 105 - COVID test: negative - CBC: Hgb 10.4 -> mild anemia - Pelvic exam: no cervical changes  Assessment/Plan Audrey Matthews is a well-appearing 22yo at [redacted]w[redacted]d who presents with increased fatigue, confusion, and pelvic pressure. Differential diagnosis includes anemia, URI, and hypoglycemia. The patient's fatigue is consistent with mild anemia. URI is less likely given no fever, congestion, rhinorrhea, pharyngitis, or cough. Hypoglycemia is less likely with CBG of 105 in the MAU today despite persistent symptoms.  -Every other day iron supplement to treat anemia -Cervical exam showing unchanged cervix.  No contractions appreciated while on monitoring. - Discussed return precautions with patient, including persistent or worsening symptoms, vaginal  bleeding, decreased fetal movement  Procedures  MDM  Fellow Attestation  I saw and evaluated the patient, performing the key elements of the service.I  personally performed or re-performed the history, physical exam, and medical decision making activities of this service and have verified that the service and findings are accurately documented in the student's note. I developed the management plan that is described in the medical student's note, and I agree with the content, with my edits above.    Derrel Nip, MD OB Fellow

## 2022-11-05 NOTE — Discharge Instructions (Signed)
It was a pleasure taking care of you today!  I am sorry you are feeling so fatigued.  Your anemia is worsened prior evaluation so I would like to start you on iron every other day.  I will send a prescription of this to your pharmacy.  Your cervix is unchanged from previous evaluations.  If you have any concerns, worsening contractions, bleeding, gush of fluid please return for further evaluation.  I hope you have a wonderful afternoon!

## 2022-11-05 NOTE — MAU Note (Signed)
.  Audrey Matthews is a 22 y.o. at [redacted]w[redacted]d here in MAU reporting: Pt reports she woke up this morning confused and disoriented. Pt reports pelvic pressure that started today. Pt reports she is extraordinarily uncomfortable with hot flashes.   Pt reports she has bladder leakage all the time, but has not had any gushes of water.  Denies vaginal bleeding  Onset of complaint: today  Pain score: 5/10 pelvic pressure  There were no vitals filed for this visit.    Lab orders placed from triage:   ua

## 2022-11-08 ENCOUNTER — Inpatient Hospital Stay (HOSPITAL_COMMUNITY)
Admission: AD | Admit: 2022-11-08 | Discharge: 2022-11-08 | Disposition: A | Discharge status: Home / Self Care | Payer: MEDICAID | Attending: Obstetrics and Gynecology | Admitting: Obstetrics and Gynecology

## 2022-11-08 ENCOUNTER — Encounter (HOSPITAL_COMMUNITY): Payer: Self-pay | Admitting: Obstetrics and Gynecology

## 2022-11-08 DIAGNOSIS — N898 Other specified noninflammatory disorders of vagina: Secondary | ICD-10-CM | POA: Insufficient documentation

## 2022-11-08 DIAGNOSIS — Z3A28 28 weeks gestation of pregnancy: Secondary | ICD-10-CM | POA: Insufficient documentation

## 2022-11-08 DIAGNOSIS — M549 Dorsalgia, unspecified: Secondary | ICD-10-CM | POA: Insufficient documentation

## 2022-11-08 DIAGNOSIS — O26893 Other specified pregnancy related conditions, third trimester: Secondary | ICD-10-CM | POA: Insufficient documentation

## 2022-11-08 LAB — URINALYSIS, ROUTINE W REFLEX MICROSCOPIC
Bilirubin Urine: NEGATIVE
Glucose, UA: NEGATIVE mg/dL
Hgb urine dipstick: NEGATIVE
Ketones, ur: NEGATIVE mg/dL
Nitrite: NEGATIVE
Protein, ur: NEGATIVE mg/dL
Specific Gravity, Urine: 1.014 (ref 1.005–1.030)
pH: 7 (ref 5.0–8.0)

## 2022-11-08 LAB — WET PREP, GENITAL
Clue Cells Wet Prep HPF POC: NONE SEEN
Sperm: NONE SEEN
Trich, Wet Prep: NONE SEEN
WBC, Wet Prep HPF POC: >=10 — AB (ref <10)
Yeast Wet Prep HPF POC: NONE SEEN

## 2022-11-08 MED ORDER — ACETAMINOPHEN 500 MG PO TABS
1000.0000 mg | ORAL_TABLET | Freq: Once | ORAL | Status: AC
  Administered 2022-11-08: 1000 mg via ORAL
  Filled 2022-11-08: qty 2

## 2022-11-08 MED ORDER — CYCLOBENZAPRINE HCL 10 MG PO TABS: 10.0000 mg | ORAL_TABLET | Freq: Two times a day (BID) | ORAL | 0 refills | Status: DC | PRN

## 2022-11-08 MED ORDER — CYCLOBENZAPRINE HCL 5 MG PO TABS
10.0000 mg | ORAL_TABLET | Freq: Once | ORAL | Status: AC
  Administered 2022-11-08: 10 mg via ORAL
  Filled 2022-11-08: qty 2

## 2022-11-08 NOTE — MAU Provider Note (Signed)
History     CSN: [id number]  Arrival date and time: 11/08/22 2009   Event Date/Time   First Provider Initiated Contact with Patient 11/08/22 2109      Chief Complaint  Patient presents with  . Vaginal Discharge  . Vaginal Bleeding   HPI  {GYN/OB HQ:[id number]}  Past Medical History:  Diagnosis Date  . ADHD (attention deficit hyperactivity disorder)   . Alteration consciousness 09/27/2019  . Anxiety   . Asthma   . 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 Autism spectrum disorder 12/25/2010  . Borderline personality disorder (HCC) 06/03/2018  . Chronic left-sided low back pain without sciatica 02/06/2022  . Chronic migraine with aura 09/27/2019  . Chronic post-traumatic stress disorder 12/25/2010  . Depression with anxiety 10/03/2015   07/20/22 No meds, doing well, IBH referral ordered  . Episode of shaking 09/27/2019  . Major depressive disorder 04/13/2017  . PTSD (post-traumatic stress disorder)   . Syncopal episodes     Past Surgical History:  Procedure Laterality Date  . CAUTERIZE INNER NOSE    . CERVICAL CERCLAGE N/A 04/14/2019   Procedure: CERCLAGE CERVICAL;  Surgeon: Noralee Space, MD;  Location: [facility] LD ORS;  Service: Gynecology;  Laterality: N/A;  . DENTAL SURGERY    . MOUTH SURGERY    . WISDOM TOOTH EXTRACTION      Family History  Problem Relation Age of Onset  . Depression Mother   . Anxiety disorder Mother   . ADD / ADHD Mother   . Drug abuse Father   . Depression Sister   . Depression Maternal Grandmother   . Jaundice Daughter     Social History   Tobacco Use  . Smoking status: Never  . Smokeless tobacco: Never  Vaping Use  . Vaping status: Never Used  Substance Use Topics  . Alcohol use: Not Currently    Comment: occ  . Drug use: No    Allergies:  Allergies  Allergen Reactions  . Latex Itching and Rash    Medications Prior to Admission   Medication Sig Dispense Refill Last Dose  . acetaminophen (TYLENOL) 500 MG tablet Take 500 mg by mouth every 6 (six) hours as needed for mild pain or headache.   11/08/2022  . doxylamine, Sleep, (UNISOM) 25 MG tablet Take 25 mg by mouth at bedtime as needed.   11/07/2022  . famotidine (PEPCID) 20 MG tablet TAKE ONE TABLET TWICE DAILY 60 tablet 1 11/08/2022  . ferrous sulfate 325 (65 FE) MG EC tablet Take 1 tablet (325 mg total) by mouth every other day. 60 tablet 2 11/08/2022  . prenatal vitamin w/FE, FA (PRENATAL 1 + 1) 27-1 MG TABS tablet Take 1 tablet by mouth daily at 12 noon. 30 tablet 12 11/08/2022  . progesterone (PROMETRIUM) 200 MG capsule Place 1 capsule (200 mg total) vaginally at bedtime. 30 capsule 5 11/07/2022  . albuterol (VENTOLIN HFA) 108 (90 Base) MCG/ACT inhaler Inhale 2 puffs into the lungs every 6 (six) hours as needed for wheezing or shortness of breath. (Patient not taking: Reported on 11/04/2022)   More than a month    Review of Systems Physical Exam   Blood pressure 121/71, pulse 98, temperature 98.3 F (36.8 C), temperature source Oral, resp. rate 16, height 5\' 1"  (1.549 m), weight 75.1 kg, last menstrual period 04/26/2022, SpO2 98%.  Physical Exam  MAU Course  Procedures  MDM ***  Assessment and  Plan  ***  Brand Males 11/08/2022, 10:03 PM

## 2022-11-08 NOTE — MAU Note (Signed)
.  Audrey Matthews is a 22 y.o. at [redacted]w[redacted]d here in MAU reporting: "abnormal amount discharge and contractions 2-3 an hour, after last urination noted pink discharge on tissue Endorses fetal movement Denies odor to vaginal discharge Pt reports this is her 3rd episode of vaginal bleeding or spotting  Onset of complaint: 4pm Pain score: 6 hips intermittent Vitals:   11/08/22 2023  BP: 121/71  Pulse: 98  Resp: 16  Temp: 98.3 F (36.8 C)  SpO2: 98%     FHT:145bpm Lab orders placed from triage:  UA

## 2022-11-09 LAB — GC/CHLAMYDIA PROBE AMP (~~LOC~~) NOT AT ARMC
Chlamydia: NEGATIVE
Comment: NEGATIVE
Comment: NORMAL
Neisseria Gonorrhea: NEGATIVE

## 2022-11-10 ENCOUNTER — Ambulatory Visit (INDEPENDENT_AMBULATORY_CARE_PROVIDER_SITE_OTHER): Payer: MEDICAID | Admitting: Clinical

## 2022-11-10 DIAGNOSIS — F840 Autistic disorder: Secondary | ICD-10-CM | POA: Diagnosis not present

## 2022-11-10 DIAGNOSIS — F909 Attention-deficit hyperactivity disorder, unspecified type: Secondary | ICD-10-CM

## 2022-11-10 DIAGNOSIS — F4312 Post-traumatic stress disorder, chronic: Secondary | ICD-10-CM

## 2022-11-10 NOTE — Patient Instructions (Signed)
[facility] [address] [place], [place] [address] [phone] (main office) [phone] (Jamie's office)   

## 2022-11-17 NOTE — BH Specialist Note (Signed)
Pt did not arrive to video visit and did not answer the phone; Left HIPPA-compliant message to call back Jamie from [facility] at  [facility] at  [phone] (Jamie's office).  ?; left MyChart message for patient.  ? ?

## 2022-11-18 ENCOUNTER — Encounter: Payer: MEDICAID | Admitting: Obstetrics & Gynecology

## 2022-11-18 ENCOUNTER — Other Ambulatory Visit: Payer: MEDICAID

## 2022-11-20 ENCOUNTER — Other Ambulatory Visit: Payer: MEDICAID

## 2022-11-20 ENCOUNTER — Encounter: Payer: Self-pay | Admitting: Obstetrics & Gynecology

## 2022-11-20 ENCOUNTER — Ambulatory Visit (INDEPENDENT_AMBULATORY_CARE_PROVIDER_SITE_OTHER): Payer: MEDICAID | Admitting: Obstetrics & Gynecology

## 2022-11-20 VITALS — BP 121/80 | HR 101 | Wt 167.0 lb

## 2022-11-20 DIAGNOSIS — Z3480 Encounter for supervision of other normal pregnancy, unspecified trimester: Secondary | ICD-10-CM

## 2022-11-20 DIAGNOSIS — Z3A29 29 weeks gestation of pregnancy: Secondary | ICD-10-CM

## 2022-11-20 DIAGNOSIS — Z3483 Encounter for supervision of other normal pregnancy, third trimester: Secondary | ICD-10-CM | POA: Diagnosis not present

## 2022-11-20 DIAGNOSIS — Z131 Encounter for screening for diabetes mellitus: Secondary | ICD-10-CM

## 2022-11-20 NOTE — Progress Notes (Signed)
   LOW-RISK PREGNANCY VISIT Patient name: Audrey Matthews MRN [id number]  Date of birth: 08/07/2000 Chief Complaint:   Routine Prenatal Visit (PN2)  History of Present Illness:   Audrey Matthews is a 22 y.o. [phone] female at [redacted]w[redacted]d with an Estimated Date of Delivery: 01/31/23 being seen today for ongoing management of a low-risk pregnancy.     07/20/2022   10:01 AM 11/05/2020    1:43 PM 02/09/2019    9:50 AM 12/27/2018    1:48 PM  Depression screen PHQ 2/9  Decreased Interest 0 1 0 1  Down, Depressed, Hopeless 0 1 0 1  PHQ - 2 Score 0 2 0 2  Altered sleeping 1 1 1  0  Tired, decreased energy 1 1 1 1   Change in appetite 2 0 0 2  Feeling bad or failure about yourself  0 1 0 1  Trouble concentrating 1 1 2  0  Moving slowly or fidgety/restless 0 0 3 1  Suicidal thoughts 0 0 0 0  PHQ-9 Score 5 6 7 7   Difficult doing work/chores   Somewhat difficult     Today she reports no complaints. Contractions: Not present. Vag. Bleeding: None.  Movement: Present. denies leaking of fluid. Review of Systems:   Pertinent items are noted in HPI Denies abnormal vaginal discharge w/ itching/odor/irritation, headaches, visual changes, shortness of breath, chest pain, abdominal pain, severe nausea/vomiting, or problems with urination or bowel movements unless otherwise stated above. Pertinent History Reviewed:  Reviewed past medical,surgical, social, obstetrical and family history.  Reviewed problem list, medications and allergies. Physical Assessment:   Vitals:   11/20/22 0907  BP: 121/80  Pulse: (!) 101  Weight: 167 lb (75.8 kg)  Body mass index is 31.55 kg/m.        Physical Examination:   General appearance: Well appearing, and in no distress  Mental status: Alert, oriented to person, place, and time  Skin: Warm & dry  Cardiovascular: Normal heart rate noted  Respiratory: Normal respiratory effort, no distress  Abdomen: Soft, gravid, nontender  Pelvic: Cervical exam deferred          Extremities: Edema: None  Fetal Status:     Movement: Present    Chaperone: n/a    No results found for this or any previous visit (from the past 24 hour(s)).  Assessment & Plan:  1) Low-risk pregnancy G3P0111 at [redacted]w[redacted]d with an Estimated Date of Delivery: 01/31/23   2) Rhogam, next week   Meds: No orders of the defined types were placed in this encounter.  Labs/procedures today: PN2  Plan:  Continue routine obstetrical care  Next visit: prefers in person    Reviewed: Preterm labor symptoms and general obstetric precautions including but not limited to vaginal bleeding, contractions, leaking of fluid and fetal movement were reviewed in detail with the patient.  All questions were answered. Has home bp cuff. Rx faxed to . Check bp weekly, let us know if >140/90.   Follow-up: Return for Tuesday 11/24/22 for RhoGam, keep 12/02/22 appt.  No orders of the defined types were placed in this encounter.   Lazaro Arms, MD 11/20/2022 10:50 AM

## 2022-11-21 LAB — HIV ANTIBODY (ROUTINE TESTING W REFLEX): HIV Screen 4th Generation wRfx: NONREACTIVE

## 2022-11-21 LAB — ANTIBODY SCREEN: Antibody Screen: NEGATIVE

## 2022-11-21 LAB — CBC
Hematocrit: 32.2 % — ABNORMAL LOW (ref 34.0–46.6)
Hemoglobin: 10.5 g/dL — ABNORMAL LOW (ref 11.1–15.9)
MCH: 27.2 pg (ref 26.6–33.0)
MCHC: 32.6 g/dL (ref 31.5–35.7)
MCV: 83 fL (ref 79–97)
Platelets: 240 10*3/uL (ref 150–450)
RBC: 3.86 x10E6/uL (ref 3.77–5.28)
RDW: 13.6 % (ref 11.7–15.4)
WBC: 8.3 10*3/uL (ref 3.4–10.8)

## 2022-11-21 LAB — GLUCOSE TOLERANCE, 2 HOURS W/ 1HR
Glucose, 1 hour: 121 mg/dL (ref 70–179)
Glucose, 2 hour: 67 mg/dL — ABNORMAL LOW (ref 70–152)
Glucose, Fasting: 73 mg/dL (ref 70–91)

## 2022-11-21 LAB — RPR: RPR Ser Ql: NONREACTIVE

## 2022-11-24 ENCOUNTER — Ambulatory Visit: Payer: MEDICAID | Admitting: *Deleted

## 2022-11-24 VITALS — BP 116/76 | HR 80

## 2022-11-24 DIAGNOSIS — Z2913 Encounter for prophylactic Rho(D) immune globulin: Secondary | ICD-10-CM | POA: Diagnosis not present

## 2022-11-24 NOTE — Progress Notes (Signed)
   NURSE VISIT- INJECTION  SUBJECTIVE:  Audrey Matthews is a 22 y.o. [phone] female here for a Rhophylac for Rh neg status during pregnancy. She is [redacted]w[redacted]d pregnant.   OBJECTIVE:  BP 116/76 (BP Location: Right Arm, Patient Position: Sitting, Cuff Size: Normal)   Pulse 80   LMP 04/26/2022   Appears well, in no apparent distress  Injection administered in: Right upper quad. gluteus  No orders of the defined types were placed in this encounter.   ASSESSMENT: Pregnancy [redacted]w[redacted]d Rhophylac for Rh neg status during pregnancy PLAN: Follow-up: as scheduled   Annamarie Dawley  11/24/2022 2:28 PM

## 2022-12-01 ENCOUNTER — Ambulatory Visit: Payer: MEDICAID | Admitting: Clinical

## 2022-12-01 DIAGNOSIS — Z91199 Patient's noncompliance with other medical treatment and regimen due to unspecified reason: Secondary | ICD-10-CM

## 2022-12-02 ENCOUNTER — Encounter: Payer: Self-pay | Admitting: Women's Health

## 2022-12-02 ENCOUNTER — Inpatient Hospital Stay (HOSPITAL_COMMUNITY): Payer: MEDICAID

## 2022-12-02 ENCOUNTER — Encounter (HOSPITAL_COMMUNITY): Payer: Self-pay | Admitting: Obstetrics & Gynecology

## 2022-12-02 ENCOUNTER — Ambulatory Visit: Payer: MEDICAID | Admitting: Women's Health

## 2022-12-02 ENCOUNTER — Encounter: Payer: MEDICAID | Admitting: Obstetrics & Gynecology

## 2022-12-02 ENCOUNTER — Inpatient Hospital Stay (HOSPITAL_COMMUNITY)
Admission: AD | Admit: 2022-12-02 | Discharge: 2022-12-02 | Disposition: A | Discharge status: Home / Self Care | Payer: MEDICAID | Attending: Obstetrics & Gynecology | Admitting: Obstetrics & Gynecology

## 2022-12-02 VITALS — BP 128/82 | HR 93 | Wt 172.0 lb

## 2022-12-02 DIAGNOSIS — O09213 Supervision of pregnancy with history of pre-term labor, third trimester: Secondary | ICD-10-CM | POA: Diagnosis not present

## 2022-12-02 DIAGNOSIS — Z3A31 31 weeks gestation of pregnancy: Secondary | ICD-10-CM | POA: Diagnosis not present

## 2022-12-02 DIAGNOSIS — Z3480 Encounter for supervision of other normal pregnancy, unspecified trimester: Secondary | ICD-10-CM

## 2022-12-02 DIAGNOSIS — O3433 Maternal care for cervical incompetence, third trimester: Secondary | ICD-10-CM | POA: Diagnosis not present

## 2022-12-02 DIAGNOSIS — O4703 False labor before 37 completed weeks of gestation, third trimester: Secondary | ICD-10-CM | POA: Diagnosis not present

## 2022-12-02 DIAGNOSIS — O4700 False labor before 37 completed weeks of gestation, unspecified trimester: Secondary | ICD-10-CM

## 2022-12-02 DIAGNOSIS — Z3483 Encounter for supervision of other normal pregnancy, third trimester: Secondary | ICD-10-CM

## 2022-12-02 DIAGNOSIS — O26899 Other specified pregnancy related conditions, unspecified trimester: Secondary | ICD-10-CM

## 2022-12-02 DIAGNOSIS — Z302 Encounter for sterilization: Secondary | ICD-10-CM

## 2022-12-02 DIAGNOSIS — Z23 Encounter for immunization: Secondary | ICD-10-CM

## 2022-12-02 LAB — URINALYSIS, ROUTINE W REFLEX MICROSCOPIC
Bilirubin Urine: NEGATIVE
Glucose, UA: NEGATIVE mg/dL
Hgb urine dipstick: NEGATIVE
Ketones, ur: NEGATIVE mg/dL
Nitrite: NEGATIVE
Protein, ur: NEGATIVE mg/dL
Specific Gravity, Urine: 1.009 (ref 1.005–1.030)
pH: 7 (ref 5.0–8.0)

## 2022-12-02 MED ORDER — NIFEDIPINE ER OSMOTIC RELEASE 30 MG PO TB24: 30.0000 mg | ORAL_TABLET | Freq: Every day | ORAL | 0 refills | Status: DC

## 2022-12-02 NOTE — MAU Note (Signed)
.  Audrey Matthews is a 22 y.o. at [redacted]w[redacted]d here in MAU reporting: sent from office. Pt having ct on monitor but no t feeling them. SVE 3.5/80. has history of preterm delivery 28 week. Good fetal movement felt. Denies s any vag bleeding or leaking a t this time.  LMP:  Onset of complaint: today Pain score: 0 Vitals:   12/02/22 1754  BP: 125/76  Pulse: (!) 113  Resp: 18  Temp: 98.3 F (36.8 C)     FHT:157  Lab orders placed from triage:  u/a

## 2022-12-02 NOTE — Progress Notes (Signed)
LOW-RISK PREGNANCY VISIT Patient name: Audrey Matthews MRN [id number]  Date of birth: Mar 29, 2000 Chief Complaint:   Routine Prenatal Visit (Feels like contractions are back)  History of Present Illness:   Audrey Matthews is a 22 y.o. [phone] female at [redacted]w[redacted]d with an Estimated Date of Delivery: 01/31/23 being seen today for ongoing management of a low-risk pregnancy.   Today she reports  contractions since yesterday, about q . Cx was 2.5/50ballotable on 8/18 MAU visit. Denies abnormal discharge, itching/odor/irritation.  Denies UTI sx.  Contractions: Irregular (every 45 minutes). Vag. Bleeding: None.  Movement: Present. denies leaking of fluid.     07/20/2022   10:01 AM 11/05/2020    1:43 PM 02/09/2019    9:50 AM 12/27/2018    1:48 PM  Depression screen PHQ 2/9  Decreased Interest 0 1 0 1  Down, Depressed, Hopeless 0 1 0 1  PHQ - 2 Score 0 2 0 2  Altered sleeping 1 1 1  0  Tired, decreased energy 1 1 1 1   Change in appetite 2 0 0 2  Feeling bad or failure about yourself  0 1 0 1  Trouble concentrating 1 1 2  0  Moving slowly or fidgety/restless 0 0 3 1  Suicidal thoughts 0 0 0 0  PHQ-9 Score 5 6 7 7   Difficult doing work/chores   Somewhat difficult         07/20/2022   10:01 AM 11/05/2020    1:43 PM  GAD 7 : Generalized Anxiety Score  Nervous, Anxious, on Edge 1 1  Control/stop worrying 2 1  Worry too much - different things 1 1  Trouble relaxing 1 1  Restless 0 1  Easily annoyed or irritable 2 2  Afraid - awful might happen 1 1  Total GAD 7 Score 8 8      Review of Systems:   Pertinent items are noted in HPI Denies abnormal vaginal discharge w/ itching/odor/irritation, headaches, visual changes, shortness of breath, chest pain, abdominal pain, severe nausea/vomiting, or problems with urination or bowel movements unless otherwise stated above. Pertinent History Reviewed:  Reviewed past medical,surgical, social, obstetrical and family history.  Reviewed problem list,  medications and allergies. Physical Assessment:   Vitals:   12/02/22 1454  BP: 128/82  Pulse: 93  Weight: 172 lb (78 kg)  Body mass index is 32.5 kg/m.        Physical Examination:   General appearance: Well appearing, and in no distress  Mental status: Alert, oriented to person, place, and time  Skin: Warm & dry  Cardiovascular: Normal heart rate noted  Respiratory: Normal respiratory effort, no distress  Abdomen: Soft, gravid, nontender  Pelvic: Cervical exam performed  Dilation: 3.5 Effacement (%): 80 Station: -2  Extremities: Edema: None  Fetal Status:     Movement: Present Presentation: Vertex NST: FHR baseline 135 bpm, Variability: moderate, Accelerations:present, Decelerations:  Absent= Cat 1/reactive Toco: 2 uc's w/ UI in   Chaperone: Sherri Kaywood   No results found for this or any previous visit (from the past 24 hour(s)).  Assessment & Plan:  1) Low-risk pregnancy G3P0111 at [redacted]w[redacted]d with an Estimated Date of Delivery: 01/31/23   2) H/O 28wk PTB d/t incompetent cx/PPROM/PTL, admitted this pregnancy @ 27wks d/t threatened PTL, cx was 2.5/50/ballotable, received BMZ 8/11 & 8/12, on prometrium at bedtime. Cx now 3.5/80/-2 and contracting w/ UI on EFM, to MAU for further eval. Message routed to MAU providers   Meds: No orders of the  defined types were placed in this encounter.  Labs/procedures today: SVE and NST  Plan:  Continue routine obstetrical care  Next visit: prefers in person    Reviewed: Preterm labor symptoms and general obstetric precautions including but not limited to vaginal bleeding, contractions, leaking of fluid and fetal movement were reviewed in detail with the patient.  All questions were answered. Does have home bp cuff. Office bp cuff given: not applicable. Check bp weekly, let us know if consistently >140 and/or >90.  Follow-up: Return for As scheduled.  Future Appointments  Date Time Provider Department Center  12/16/2022  2:50 PM Arabella Merles, CNM [facility]  12/30/2022  2:50 PM Arabella Merles, CNM [facility]  01/06/2023  2:50 PM Cheral Marker, CNM [facility]  01/13/2023  2:50 PM Cheral Marker, CNM [facility]  01/20/2023  2:50 PM Cheral Marker, CNM [facility]    No orders of the defined types were placed in this encounter.  Cheral Marker CNM, [facility] 12/02/2022 3:14 PM

## 2022-12-02 NOTE — MAU Provider Note (Signed)
History     CSN: [id number]  Arrival date and time: 12/02/22 1738   Event Date/Time   First Provider Initiated Contact with Patient 12/02/22 1833      Chief Complaint  Patient presents with   Contractions   HPI Shakierra Imler is a 22 y.o. [id number] at [redacted]w[redacted]d who presents today after clinic visit where she reported ctx every 45 minutes. Her cervical exam was 3.5cm/80/-2 and was recommended to go to MAU for further evaluation. Patient states she does not feel her contractions at all, has had maybe 3 contractions all day per her recall. She denies any VB, LOF. Having good FM.   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   On adderall prior to pregnancy, stopped w/ +PT, doing well   Autism spectrum disorder 12/25/2010   Borderline personality disorder (HCC) 06/03/2018   Chronic left-sided low back pain without sciatica 02/06/2022   Chronic migraine with aura 09/27/2019   Chronic post-traumatic stress disorder 12/25/2010   Depression with anxiety 10/03/2015   07/20/22 No meds, doing well, [facility] referral ordered   Episode of shaking 09/27/2019   Major depressive disorder 04/13/2017   PTSD (post-traumatic stress disorder)    Syncopal episodes     Past Surgical History:  Procedure Laterality Date   CAUTERIZE INNER NOSE     CERVICAL CERCLAGE N/A 04/14/2019   Procedure: CERCLAGE CERVICAL;  Surgeon: Noralee Space, MD;  Location: [facility] LD [facility]S;  Service: Gynecology;  Laterality: N/A;   DENTAL SURGERY     MOUTH SURGERY     WISDOM TOOTH EXTRACTION      Family History  Problem Relation Age of Onset   Depression Mother    Anxiety disorder Mother    ADD / ADHD Mother    Drug abuse Father    Depression Sister    Depression Maternal Grandmother    Jaundice Daughter     Social History   Tobacco Use   Smoking  status: Never   Smokeless tobacco: Never  Vaping Use   Vaping status: Never Used  Substance Use Topics   Alcohol use: Not Currently    Comment: occ   Drug use: No    Allergies:  Allergies  Allergen Reactions   Latex Itching and Rash    No medications prior to admission.    Review of Systems  Constitutional:  Negative for chills and fever.  Respiratory:  Negative for chest tightness and shortness of breath.   Cardiovascular:  Negative for chest pain and palpitations.  Gastrointestinal:  Negative for abdominal pain, constipation, diarrhea, nausea and vomiting.  Genitourinary:  Negative for pelvic pain, vaginal bleeding and vaginal discharge.  Skin:  Negative for rash.  Neurological:  Negative for light-headedness and headaches.   Physical Exam   Blood pressure 118/72, pulse 96, temperature 98.6 F (37 C), temperature source Oral, resp. rate 18, height 5\' 1"  (1.549 m), weight 78 kg, last menstrual period 04/26/2022, SpO2 99%.  Physical Exam Constitutional:      General: She is not in acute distress.    Appearance: Normal appearance. She is not ill-appearing.  HENT:     Head: Normocephalic and atraumatic.  Cardiovascular:     Rate and Rhythm: Normal rate and regular rhythm.     Heart sounds: Normal heart sounds.  Pulmonary:     Effort: Pulmonary  effort is normal. No respiratory distress.     Breath sounds: Normal breath sounds.  Abdominal:     General: There is no distension.     Palpations: Abdomen is soft.     Tenderness: There is no abdominal tenderness. There is no guarding.  Musculoskeletal:        General: Normal range of motion.  Skin:    General: Skin is warm and dry.  Neurological:     General: No focal deficit present.     Mental Status: She is alert and oriented to person, place, and time.   Dilation: 3.5 Effacement (%): 80 Exam by:: Dr. Lucianne Muss  EFM: 150/mod/+a/-d  MAU Course  Procedures  MDM 22 y.o. [id number] at [redacted]w[redacted]d who presents today for  threatened preterm labor. She was recently admitted close to 27 wks for the same and is s/p course of BMZ and GBS cx collected at that time was neg. She reported at [facility] appt earlier today that she felt irregular ctx and had change in cervical exam. On arrival to MAU, no ctx noted on toco and patient not feeling ctx and her cervical exam is the same. Discussed case with Dr. Jolayne Panther, who recommended starting Procardia XL 30mg  and continuing close f/up at prenatal care appts. Discussed labor precautions in detail with patient.  Addendum:  On EFM just prior to d/c, noted to have a variable decel -- pt discussed w Dr Crissie Reese who rec BPP and if was ok, would be stable for d/c. BPP 8/8. Will arrange closer f/up in clinic for patient. Message sent.   Assessment and Plan  Threatened premature labor in third trimester - Plan: Discharge patient  Patient discussed with Dr. Jolayne Panther and Dr. Crissie Reese.  Sundra Aland 12/02/2022, 9:24 PM

## 2022-12-02 NOTE — Patient Instructions (Signed)
Mon[place]ie, thank you for choosing our office today! We appreciate the opportunity to meet your healthcare needs. You may receive a short survey by mail, e-mail, or through [employer]. If you are happy with your care we would appreciate if you could take just a few minutes to complete the survey questions. We read all of your comments and take your feedback very seriously. Thank you again for choosing our office.  Center for [employer] Team at [facility]  [facility] at [facility] ([address] [place], [place] [address]) Entrance C, located off of [address] Free 24/7 valet parking   CLASSES: Go to [email or web address] to register for classes (childbirth, breastfeeding, waterbirth, infant CPR, daddy bootcamp, etc.)  Call the office [phone]) or go to [facility] if: You begin to have strong, frequent contractions Your water breaks.  Sometimes it is a big gush of fluid, sometimes it is just a trickle that keeps getting your panties wet or running down your legs You have vaginal bleeding.  It is normal to have a small amount of spotting if your cervix was checked.  You don't feel your baby moving like normal.  If you don't, get you something to eat and drink and lay down and focus on feeling your baby move.   If your baby is still not moving like normal, you should call the office or go to [facility].  Call the office [phone]) or go to [facility] hospital for these signs of pre-eclampsia: Severe headache that does not go away with Tylenol Visual changes- seeing spots, double, blurred vision Pain under your right breast or upper abdomen that does not go away with Tums or heartburn medicine Nausea and/or vomiting Severe swelling in your hands, feet, and face   Tdap Vaccine It is recommended that you get the Tdap vaccine during the third trimester of EACH pregnancy to help protect your baby from getting pertussis (whooping cough) 27-36 weeks is the BEST time to do  this so that you can pass the protection on to your baby. During pregnancy is better than after pregnancy, but if you are unable to get it during pregnancy it will be offered at the hospital.  You can get this vaccine with [place], at the health department, your family doctor, or some local pharmacies Everyone who will be around your baby should also be up-to-date on their vaccines before the baby comes. Adults (who are not pregnant) only need 1 dose of Tdap during adulthood.   [facility] Pediatricians/Family Doctors [facility]): [address] Colette Ribas, [phone]           [facility]: [address], [phone]                [facility] Medicine [facility]): [address], [phone] (call to ask if accepting patients) [facility] Department: [address], [place], [phone]    [facility] Pediatricians/Family Doctors [facility]): [phone] [address], [phone] [facility]: [address] [place], [phone] [facility]: [address], [phone]  [facility] Doctors  [facility]): [phone] [facility]: [address] [phone]   [facility] Doctors [facility]: [address] [phone]  [facility] Family Doctors  [facility]: [phone], [phone]  Home Blood Pressure Monitoring for Patients   Your provider has recommended that you check your  blood pressure (BP) at least once a week at home. If you do not have a blood pressure cuff at home, one will be provided for you. Contact your provider if you have not received your monitor within 1 week.   Helpful Tips for Accurate Home Blood Pressure Checks  Don't smoke, exercise, or drink caffeine 30 minutes before checking your BP Use the restroom before checking your BP (a full bladder can raise your  pressure) Relax in a comfortable upright chair Feet on the ground Left arm resting comfortably on a flat surface at the level of your heart Legs uncrossed Back supported Sit quietly and don't talk Place the cuff on your bare arm Adjust snuggly, so that only two fingertips can fit between your skin and the top of the cuff Check 2 readings separated by at least one minute Keep a log of your BP readings For a visual, please reference this diagram: http://ccnc.care/bpdiagram  Provider Name: [facility]     Phone: [phone]  Zone 1: ALL CLEAR  Continue to monitor your symptoms:  BP reading is less than 140 (top number) or less than 90 (bottom number)  No right upper stomach pain No headaches or seeing spots No feeling nauseated or throwing up No swelling in face and hands  Zone 2: CAUTION Call your doctor's office for any of the following:  BP reading is greater than 140 (top number) or greater than 90 (bottom number)  Stomach pain under your ribs in the middle or right side Headaches or seeing spots Feeling nauseated or throwing up Swelling in face and hands  Zone 3: EMERGENCY  Seek immediate medical care if you have any of the following:  BP reading is greater than160 (top number) or greater than 110 (bottom number) Severe headaches not improving with Tylenol Serious difficulty catching your breath Any worsening symptoms from Zone 2  Preterm Labor and Birth Information  The normal length of a pregnancy is 39-41 weeks. Preterm labor is when labor starts before 37 completed weeks of pregnancy. What are the risk factors for preterm labor? Preterm labor is more likely to occur in women who: Have certain infections during pregnancy such as a bladder infection, sexually transmitted infection, or infection inside the uterus (chorioamnionitis). Have a shorter-than-normal cervix. Have gone into preterm labor before. Have had surgery on their cervix. Are younger than age 17  or older than age 35. Are African American. Are pregnant with twins or multiple babies (multiple gestation). Take street drugs or smoke while pregnant. Do not gain enough weight while pregnant. Became pregnant shortly after having been pregnant. What are the symptoms of preterm labor? Symptoms of preterm labor include: Cramps similar to those that can happen during a menstrual period. The cramps may happen with diarrhea. Pain in the abdomen or lower back. Regular uterine contractions that may feel like tightening of the abdomen. A feeling of increased pressure in the pelvis. Increased watery or bloody mucus discharge from the vagina. Water breaking (ruptured amniotic sac). Why is it important to recognize signs of preterm labor? It is important to recognize signs of preterm labor because babies who are born prematurely may not be fully developed. This can put them at an increased risk for: Long-term (chronic) heart and lung problems. Difficulty immediately after birth with regulating body systems, including blood sugar, body temperature, heart rate, and breathing rate. Bleeding in the brain. Cerebral palsy. Learning difficulties. Death. These risks are highest for babies who are born before 34 weeks   of pregnancy. How is preterm labor treated? Treatment depends on the length of your pregnancy, your condition, and the health of your baby. It may involve: Having a stitch (suture) placed in your cervix to prevent your cervix from opening too early (cerclage). Taking or being given medicines, such as: Hormone medicines. These may be given early in pregnancy to help support the pregnancy. Medicine to stop contractions. Medicines to help mature the baby's lungs. These may be prescribed if the risk of delivery is high. Medicines to prevent your baby from developing cerebral palsy. If the labor happens before 34 weeks of pregnancy, you may need to stay in the hospital. What should I do if I  think I am in preterm labor? If you think that you are going into preterm labor, call your health care provider right away. How can I prevent preterm labor in future pregnancies? To increase your chance of having a full-term pregnancy: Do not use any tobacco products, such as cigarettes, chewing tobacco, and e-cigarettes. If you need help quitting, ask your health care provider. Do not use street drugs or medicines that have not been prescribed to you during your pregnancy. Talk with your health care provider before taking any herbal supplements, even if you have been taking them regularly. Make sure you gain a healthy amount of weight during your pregnancy. Watch for infection. If you think that you might have an infection, get it checked right away. Make sure to tell your health care provider if you have gone into preterm labor before. This information is not intended to replace advice given to you by your health care provider. Make sure you discuss any questions you have with your health care provider. Document Revised: 07/01/2018 Document Reviewed: 07/31/2015 Elsevier Patient Education  2020 Elsevier Inc.   

## 2022-12-04 LAB — CULTURE, BETA STREP (GROUP B ONLY)

## 2022-12-08 ENCOUNTER — Telehealth: Payer: Self-pay | Admitting: *Deleted

## 2022-12-08 ENCOUNTER — Encounter: Payer: Self-pay | Admitting: Obstetrics & Gynecology

## 2022-12-08 ENCOUNTER — Ambulatory Visit (INDEPENDENT_AMBULATORY_CARE_PROVIDER_SITE_OTHER): Payer: MEDICAID | Admitting: Obstetrics & Gynecology

## 2022-12-08 VITALS — BP 114/77 | HR 86 | Wt 172.0 lb

## 2022-12-08 DIAGNOSIS — Z3A32 32 weeks gestation of pregnancy: Secondary | ICD-10-CM

## 2022-12-08 DIAGNOSIS — Z3480 Encounter for supervision of other normal pregnancy, unspecified trimester: Secondary | ICD-10-CM

## 2022-12-08 DIAGNOSIS — O4703 False labor before 37 completed weeks of gestation, third trimester: Secondary | ICD-10-CM

## 2022-12-08 NOTE — Telephone Encounter (Signed)
Patient called stating she just went to the bathroom and noted blood when she wiped.  States she has been continuing to have contractions the last couple of days but has noticed an increase in pressure in her hips and lower back.  She has been taking the Procardia as prescribed but still is contracting about every 15 minutes.  Denies leaking.  Patient lives in [place] and will not be able to get to our office before we close for lunch at 12:30. Will have patient come in at 1:30 but advised if pressure or bleeding increases, to go to MAU. Pt verbalized understanding and agreeable to plan.

## 2022-12-08 NOTE — Progress Notes (Signed)
LOW-RISK PREGNANCY VISIT Patient name: Audrey Matthews MRN [id number]  Date of birth: 09/15/2000 Chief Complaint:   Routine Prenatal Visit (Having pain in back and lower abdomen. Decreased fetal movement since Saturday. Spotting today just when wiping pink in color.)  History of Present Illness:   Audrey Matthews is a 22 y.o. [phone] female at [redacted]w[redacted]d with an Estimated Date of Delivery: 01/31/23 being seen today for ongoing management of a low-risk pregnancy.   She is worked in today due to preterm contractions she was seen 6 days ago in MAU, 3.5 cm 80% effaced  Has procardia xl 10 prn also still using the prometrium     07/20/2022   10:01 AM 11/05/2020    1:43 PM 02/09/2019    9:50 AM 12/27/2018    1:48 PM  Depression screen PHQ 2/9  Decreased Interest 0 1 0 1  Down, Depressed, Hopeless 0 1 0 1  PHQ - 2 Score 0 2 0 2  Altered sleeping 1 1 1  0  Tired, decreased energy 1 1 1 1   Change in appetite 2 0 0 2  Feeling bad or failure about yourself  0 1 0 1  Trouble concentrating 1 1 2  0  Moving slowly or fidgety/restless 0 0 3 1  Suicidal thoughts 0 0 0 0  PHQ-9 Score 5 6 7 7   Difficult doing work/chores   Somewhat difficult     Today she reports no complaints. Contractions: Irregular. Vag. Bleeding: Scant (pink when wiping).  Movement: Present (Less than usual). denies leaking of fluid. Review of Systems:   Pertinent items are noted in HPI Denies abnormal vaginal discharge w/ itching/odor/irritation, headaches, visual changes, shortness of breath, chest pain, abdominal pain, severe nausea/vomiting, or problems with urination or bowel movements unless otherwise stated above. Pertinent History Reviewed:  Reviewed past medical,surgical, social, obstetrical and family history.  Reviewed problem list, medications and allergies. Physical Assessment:   Vitals:   12/08/22 1358  BP: 114/77  Pulse: 86  Weight: 172 lb (78 kg)  Body mass index is 32.5 kg/m.        Physical Examination:    General appearance: Well appearing, and in no distress  Mental status: Alert, oriented to person, place, and time  Skin: Warm & dry  Cardiovascular: Normal heart rate noted  Respiratory: Normal respiratory effort, no distress  Abdomen: Soft, gravid, nontender  Pelvic: Cervical exam performed       3.5/50/-2/soft  Extremities: Edema: None  Fetal Status:     Movement: Present (Less than usual)    Audrey Matthews is at [redacted]w[redacted]d Estimated Date of Delivery: 01/31/23  NST being performed due to preterm contractions  Today the NST is Reactive  Fetal Monitoring:  Baseline: 140 bpm, Variability: Good {> 6 bpm), Accelerations: Reactive, and Decelerations: Absent   reactive  The accelerations are >15 bpm and more than 2 in 20 minutes  Final diagnosis:  Reactive NST  Lazaro Arms, MD     Chaperone:  Freddie Apley, RN     No results found for this or any previous visit (from the past 24 hour(s)).  Assessment & Plan:  1) Low-risk pregnancy G3P0111 at [redacted]w[redacted]d with an Estimated Date of Delivery: 01/31/23   2) PTL, cont meds   Meds: No orders of the defined types were placed in this encounter.  Labs/procedures today: NST  Plan:  Continue routine obstetrical care  Next visit: prefers in person      Follow-up: No follow-ups on file.  No orders  of the defined types were placed in this encounter.   Lazaro Arms, MD 12/08/2022 3:08 PM

## 2022-12-10 ENCOUNTER — Encounter: Payer: Self-pay | Admitting: Women's Health

## 2022-12-10 ENCOUNTER — Inpatient Hospital Stay (HOSPITAL_COMMUNITY): Payer: MEDICAID

## 2022-12-10 ENCOUNTER — Telehealth: Payer: Self-pay | Admitting: *Deleted

## 2022-12-10 ENCOUNTER — Encounter (HOSPITAL_COMMUNITY): Payer: Self-pay | Admitting: Obstetrics & Gynecology

## 2022-12-10 ENCOUNTER — Inpatient Hospital Stay (HOSPITAL_COMMUNITY)
Admission: AD | Admit: 2022-12-10 | Discharge: 2022-12-10 | Disposition: A | Discharge status: Home / Self Care | Payer: MEDICAID | Attending: Obstetrics & Gynecology | Admitting: Obstetrics & Gynecology

## 2022-12-10 DIAGNOSIS — Z3A32 32 weeks gestation of pregnancy: Secondary | ICD-10-CM | POA: Insufficient documentation

## 2022-12-10 DIAGNOSIS — O4703 False labor before 37 completed weeks of gestation, third trimester: Secondary | ICD-10-CM

## 2022-12-10 DIAGNOSIS — O43193 Other malformation of placenta, third trimester: Secondary | ICD-10-CM

## 2022-12-10 DIAGNOSIS — O09213 Supervision of pregnancy with history of pre-term labor, third trimester: Secondary | ICD-10-CM

## 2022-12-10 DIAGNOSIS — O42913 Preterm premature rupture of membranes, unspecified as to length of time between rupture and onset of labor, third trimester: Secondary | ICD-10-CM

## 2022-12-10 DIAGNOSIS — O09293 Supervision of pregnancy with other poor reproductive or obstetric history, third trimester: Secondary | ICD-10-CM | POA: Diagnosis present

## 2022-12-10 DIAGNOSIS — O479 False labor, unspecified: Secondary | ICD-10-CM

## 2022-12-10 DIAGNOSIS — Z0371 Encounter for suspected problem with amniotic cavity and membrane ruled out: Secondary | ICD-10-CM | POA: Diagnosis not present

## 2022-12-10 LAB — URINALYSIS, ROUTINE W REFLEX MICROSCOPIC
Bacteria, UA: NONE SEEN
Bilirubin Urine: NEGATIVE
Glucose, UA: NEGATIVE mg/dL
Hgb urine dipstick: NEGATIVE
Ketones, ur: NEGATIVE mg/dL
Nitrite: NEGATIVE
Protein, ur: 30 mg/dL — AB
Specific Gravity, Urine: 1.026 (ref 1.005–1.030)
WBC, UA: >50 WBC/hpf (ref 0–5)
pH: 6 (ref 5.0–8.0)

## 2022-12-10 LAB — POCT FERN TEST: POCT Fern Test: POSITIVE

## 2022-12-10 LAB — RUPTURE OF MEMBRANE (ROM)PLUS: Rom Plus: NEGATIVE

## 2022-12-10 NOTE — MAU Note (Signed)
.  Audrey Matthews is a 22 y.o. at [redacted]w[redacted]d here in MAU reporting: she started leaking clear fluid around 0900 this morning and has continued to leak since. She has changed her pad once today. She has also been having lower back/hip/abdominal pain that started sometime in the middle of the night. The back pain is aching and stabbing and comes on for 10-20 minutes at a time - she has about 2 episodes of this pain per hour. She also has intermittent abdominal cramping that feels like a period. Has had a HA for the past two days, responded to tylenol for a little bit, but came back. Denies VB, reports good FM.   Seen in MAU on 9/11 for UC and SVE 3.5/80.   Onset of complaint: Today Pain score: 6/10 abdomen and back, 7/10 HA Vitals:   12/10/22 1750 12/10/22 1758  BP: 114/78 113/83  Pulse: (!) 111 (!) 125  Resp: 20   Temp: 98.1 F (36.7 C)   SpO2: 99%      FHT:155 Lab orders placed from triage:  U/A

## 2022-12-10 NOTE — MAU Provider Note (Addendum)
History     [id number]  Arrival date and time: 12/10/22 1730    Chief Complaint  Patient presents with   Abdominal Pain   Back Pain   Rupture of Membranes     HPI Audrey Matthews is a 22 y.o. at [redacted]w[redacted]d who presents for abdominal pain, back pain, & vaginal discharge. Hx of 28 week delivery after PPROM. Was admitted in August due to premature cervical dilation. Was given BMZ at the time & discharged on procardia 10. Was seen last week in MAU & cervix 3.5 cm dilated.  Noticed clear fluid leaking at 9 am this morning. Had to wear a pad. No vaginal bleeding, dysuria, recent intercourse, vaginal irritation.  Also reports abdominal/back pain that is intermittent & occurs every 10-20 minutes.  Reports good fetal movement.    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   On adderall prior to pregnancy, stopped w/ +PT, doing well   Autism spectrum disorder 12/25/2010   Borderline personality disorder (HCC) 06/03/2018   Chronic left-sided low back pain without sciatica 02/06/2022   Chronic migraine with aura 09/27/2019   Chronic post-traumatic stress disorder 12/25/2010   Depression with anxiety 10/03/2015   07/20/22 No meds, doing well, [facility] referral ordered   Episode of shaking 09/27/2019   Major depressive disorder 04/13/2017   PTSD (post-traumatic stress disorder)    Syncopal episodes     Past Surgical History:  Procedure Laterality Date   CAUTERIZE INNER NOSE     CERVICAL CERCLAGE N/A 04/14/2019   Procedure: CERCLAGE CERVICAL;  Surgeon: Noralee Space, MD;  Location: [facility];  Service: Gynecology;  Laterality: N/A;    DENTAL SURGERY     MOUTH SURGERY     WISDOM TOOTH EXTRACTION      Family History  Problem Relation Age of Onset   Depression Mother    Anxiety disorder Mother    ADD / ADHD Mother    Drug abuse Father    Depression Sister    Depression Maternal Grandmother    Jaundice Daughter     Allergies  Allergen Reactions   Latex Itching and Rash    No current facility-administered medications on file prior to encounter.   Current Outpatient Medications on File Prior to Encounter  Medication Sig Dispense Refill   acetaminophen (TYLENOL) 500 MG tablet Take 500 mg by mouth every 6 (six) hours as needed for mild pain or headache.     prenatal vitamin w/FE, FA (PRENATAL 1 + 1) 27-1 MG TABS tablet Take 1 tablet by mouth daily at 12 noon. 30 tablet 12   albuterol (VENTOLIN HFA) 108 (90 Base) MCG/ACT inhaler Inhale 2 puffs into the lungs every 6 (six) hours as needed for wheezing or shortness of breath. (Patient not taking: Reported on 11/04/2022)     progesterone (PROMETRIUM) 200 MG capsule Place 1 capsule (200 mg total) vaginally at bedtime.  30 capsule 5     ROS Pertinent positives and negative per HPI, all others reviewed and negative  Physical Exam   BP 113/83   Pulse (!) 125   Temp 98.1 F (36.7 C) (Oral)   Resp 20   LMP 04/26/2022   SpO2 99%   Patient Vitals for the past 24 hrs:  BP Temp Temp src Pulse Resp SpO2  12/10/22 1758 113/83 -- -- (!) 125 -- --  12/10/22 1750 114/78 98.1 F (36.7 C) Oral (!) 111 20 99 %    Physical Exam Vitals reviewed. Exam conducted with a chaperone present.  Constitutional:      General: She is not in acute distress.    Appearance: She is well-developed.  HENT:     Head: Normocephalic and atraumatic.  Pulmonary:     Effort: Pulmonary effort is normal. No respiratory distress.  Abdominal:     Palpations: Abdomen is soft.     Tenderness: There is no abdominal tenderness.  Genitourinary:    Comments: SSE: no pooling. Small amount of thick  white discharge. No blood.  Skin:    General: Skin is warm and dry.     Coloration: Skin is pale.  Neurological:     Mental Status: She is alert.      Cervical Exam Dilation: 3.5 Effacement (%): 50 Cervical Position: Posterior Station: Ballotable Exam by:: Judeth Horn NP  FHT Baseline 130, moderate variability, 15x15 accels, no decels Toco: irregular (decreased after PO fluids) Cat: 1  Labs Results for orders placed or performed during the hospital encounter of 12/10/22 (from the past 24 hour(s))  Urinalysis, Routine w reflex microscopic -Urine, Clean Catch     Status: Abnormal   Collection Time: 12/10/22  6:20 PM  Result Value Ref Range   Color, Urine AMBER (A) YELLOW   APPearance CLOUDY (A) CLEAR   Specific Gravity, Urine 1.026 1.005 - 1.030   pH 6.0 5.0 - 8.0   Glucose, UA NEGATIVE NEGATIVE mg/dL   Hgb urine dipstick NEGATIVE NEGATIVE   Bilirubin Urine NEGATIVE NEGATIVE   Ketones, ur NEGATIVE NEGATIVE mg/dL   Protein, ur 30 (A) NEGATIVE mg/dL   Nitrite NEGATIVE NEGATIVE   Leukocytes,Ua MODERATE (A) NEGATIVE   RBC / HPF 0-5 0 - 5 RBC/hpf   WBC, UA >50 0 - 5 WBC/hpf   Bacteria, UA NONE SEEN NONE SEEN   Squamous Epithelial / HPF 11-20 0 - 5 /HPF   Mucus PRESENT   Rupture of Membrane (ROM) Plus     Status: None   Collection Time: 12/10/22  6:34 PM  Result Value Ref Range   Rom Plus NEGATIVE   POCT fern test     Status: Abnormal   Collection Time: 12/10/22  7:08 PM  Result Value Ref Range   POCT Fern Test Positive = ruptured amniotic membanes     Imaging No results found.  MAU Course  Procedures Lab Orders         Urinalysis, Routine w reflex microscopic -Urine, Clean Catch         Rupture of Membrane (ROM) Plus         POCT fern test    No orders of the defined types were placed in this encounter.  Imaging Orders         [place] MFM OB LIMITED      MDM LOF -SSE performed. No pooling of fluid but fern positive. ROM plus negative & AFI 11.5 (was 12.4 on  9/11)  Abd/back  pain -U/a with moderate leuks. No urinary complaints. Will send for culture -Cervix 3.5/50/ballotable & unchanged after 3 hours of monitoring -Reviewed PTL precautions & reasons to return Assessment and Plan   1. Encounter for suspected PROM, with rupture of membranes not found   2. Braxton Hicks contractions   3. [redacted] weeks gestation of pregnancy    -PTL precautions & kick counts -Keep f/u with office as scheduled -urine culture pending  Judeth Horn, NP 12/10/22 9:29 PM

## 2022-12-10 NOTE — Telephone Encounter (Signed)
Pt reports leaking clear fluid that is wetting her clothes since this morning.Pt also experiencing pain and contractions. Advised to go to MAU for evaluation. No other questions at this time.

## 2022-12-11 LAB — CULTURE, OB URINE
Culture: NO GROWTH
Special Requests: NORMAL

## 2022-12-16 ENCOUNTER — Ambulatory Visit (INDEPENDENT_AMBULATORY_CARE_PROVIDER_SITE_OTHER): Payer: MEDICAID | Admitting: Advanced Practice Midwife

## 2022-12-16 ENCOUNTER — Encounter: Payer: Self-pay | Admitting: Advanced Practice Midwife

## 2022-12-16 VITALS — BP 108/72 | HR 88 | Wt 170.5 lb

## 2022-12-16 DIAGNOSIS — Z3A33 33 weeks gestation of pregnancy: Secondary | ICD-10-CM

## 2022-12-16 DIAGNOSIS — N898 Other specified noninflammatory disorders of vagina: Secondary | ICD-10-CM

## 2022-12-16 DIAGNOSIS — O26893 Other specified pregnancy related conditions, third trimester: Secondary | ICD-10-CM | POA: Diagnosis not present

## 2022-12-16 LAB — POCT FERNING: Ferning, POC: NEGATIVE

## 2022-12-16 NOTE — Progress Notes (Signed)
LOW-RISK PREGNANCY VISIT Patient name: Audrey Matthews MRN [id number]  Date of birth: March 14, 2001 Chief Complaint:   Routine Prenatal Visit (Leaking clear fluid since 12:30pm today)  History of Present Illness:   Audrey Matthews is a 22 y.o. [phone] female at [redacted]w[redacted]d with an Estimated Date of Delivery: 01/31/23 being seen today for ongoing management of a low-risk pregnancy.  Today she reports  leaking since she woke up at 12:30pm today, esp with wiping; still taking Procardia and using progesterone vag . Contractions: Irregular. Vag. Bleeding: None.  Movement: Present. Reports leaking of fluid. Review of Systems:   Pertinent items are noted in HPI Denies abnormal vaginal discharge w/ itching/odor/irritation, headaches, visual changes, shortness of breath, chest pain, abdominal pain, severe nausea/vomiting, or problems with urination or bowel movements unless otherwise stated above. Pertinent History Reviewed:  Reviewed past medical,surgical, social, obstetrical and family history.  Reviewed problem list, medications and allergies. Physical Assessment:   Vitals:   12/16/22 1451  BP: 108/72  Pulse: 88  Weight: 170 lb 8 oz (77.3 kg)  Body mass index is 32.22 kg/m.        Physical Examination:   General appearance: Well appearing, and in no distress  Mental status: Alert, oriented to person, place, and time  Skin: Warm & dry  Cardiovascular: Normal heart rate noted  Respiratory: Normal respiratory effort, no distress  Abdomen: Soft, gravid, nontender  Pelvic:  SSE performed (no digital exam); cx visually closed, thin white d/c present          Extremities: Edema: None  Fetal Status: Fetal Heart Rate (bpm): 146 Fundal Height: 33 cm Movement: Present    Results for orders placed or performed in visit on 12/16/22 (from the past 24 hour(s))  POCT Ferning   Collection Time: 12/16/22  3:16 PM  Result Value Ref Range   Ferning, POC Negative Negative    Assessment & Plan:  1) Low-risk  pregnancy G3P0111 at [redacted]w[redacted]d with an Estimated Date of Delivery: 01/31/23   2) Hx 28wk PTB after PPROM/incomp cx with leaking today, cx previously 3.5cm (not examined digitally today); ROM ruled out today  3) Will add LROB next week to be able to monitor symptoms   Meds: No orders of the defined types were placed in this encounter.  Labs/procedures today: SSE/fern  Plan:  Continue routine obstetrical care   Reviewed: Preterm labor symptoms and general obstetric precautions including but not limited to vaginal bleeding, contractions, leaking of fluid and fetal movement were reviewed in detail with the patient.  All questions were answered. Has home bp cuff. Check bp weekly, let us know if >140/90.   Follow-up: Return for add in another LROB for next week.  Orders Placed This Encounter  Procedures   POCT Ferning   Arabella Merles [facility] 12/16/2022 3:26 PM

## 2022-12-20 ENCOUNTER — Encounter (HOSPITAL_COMMUNITY): Payer: Self-pay | Admitting: Obstetrics and Gynecology

## 2022-12-20 ENCOUNTER — Inpatient Hospital Stay (HOSPITAL_COMMUNITY)
Admission: AD | Admit: 2022-12-20 | Discharge: 2022-12-20 | Disposition: A | Discharge status: Home / Self Care | Payer: MEDICAID | Attending: Obstetrics and Gynecology | Admitting: Obstetrics and Gynecology

## 2022-12-20 ENCOUNTER — Other Ambulatory Visit: Payer: Self-pay

## 2022-12-20 DIAGNOSIS — Z3A34 34 weeks gestation of pregnancy: Secondary | ICD-10-CM | POA: Insufficient documentation

## 2022-12-20 DIAGNOSIS — O26893 Other specified pregnancy related conditions, third trimester: Secondary | ICD-10-CM | POA: Insufficient documentation

## 2022-12-20 DIAGNOSIS — O99013 Anemia complicating pregnancy, third trimester: Secondary | ICD-10-CM | POA: Diagnosis not present

## 2022-12-20 DIAGNOSIS — Z79899 Other long term (current) drug therapy: Secondary | ICD-10-CM | POA: Insufficient documentation

## 2022-12-20 DIAGNOSIS — K5900 Constipation, unspecified: Secondary | ICD-10-CM | POA: Insufficient documentation

## 2022-12-20 DIAGNOSIS — O99613 Diseases of the digestive system complicating pregnancy, third trimester: Secondary | ICD-10-CM | POA: Insufficient documentation

## 2022-12-20 DIAGNOSIS — N898 Other specified noninflammatory disorders of vagina: Secondary | ICD-10-CM | POA: Insufficient documentation

## 2022-12-20 DIAGNOSIS — R55 Syncope and collapse: Secondary | ICD-10-CM | POA: Insufficient documentation

## 2022-12-20 LAB — COMPREHENSIVE METABOLIC PANEL
ALT: 17 U/L (ref 0–44)
AST: 20 U/L (ref 15–41)
Albumin: 2.6 g/dL — ABNORMAL LOW (ref 3.5–5.0)
Alkaline Phosphatase: 92 U/L (ref 38–126)
Anion gap: 12 (ref 5–15)
BUN: 7 mg/dL (ref 6–20)
CO2: 19 mmol/L — ABNORMAL LOW (ref 22–32)
Calcium: 9.2 mg/dL (ref 8.9–10.3)
Chloride: 102 mmol/L (ref 98–111)
Creatinine, Ser: 0.42 mg/dL — ABNORMAL LOW (ref 0.44–1.00)
GFR, Estimated: >60 mL/min (ref >60)
Glucose, Bld: 75 mg/dL (ref 70–99)
Potassium: 3.5 mmol/L (ref 3.5–5.1)
Sodium: 133 mmol/L — ABNORMAL LOW (ref 135–145)
Total Bilirubin: 0.2 mg/dL — ABNORMAL LOW (ref 0.3–1.2)
Total Protein: 6.6 g/dL (ref 6.5–8.1)

## 2022-12-20 LAB — URINALYSIS, ROUTINE W REFLEX MICROSCOPIC
Bilirubin Urine: NEGATIVE
Glucose, UA: NEGATIVE mg/dL
Hgb urine dipstick: NEGATIVE
Ketones, ur: 5 mg/dL — AB
Nitrite: NEGATIVE
Protein, ur: 30 mg/dL — AB
Specific Gravity, Urine: 1.028 (ref 1.005–1.030)
pH: 5 (ref 5.0–8.0)

## 2022-12-20 LAB — CBC
HCT: 31.9 % — ABNORMAL LOW (ref 36.0–46.0)
Hemoglobin: 10.3 g/dL — ABNORMAL LOW (ref 12.0–15.0)
MCH: 25.4 pg — ABNORMAL LOW (ref 26.0–34.0)
MCHC: 32.3 g/dL (ref 30.0–36.0)
MCV: 78.6 fL — ABNORMAL LOW (ref 80.0–100.0)
Platelets: 314 10*3/uL (ref 150–400)
RBC: 4.06 MIL/uL (ref 3.87–5.11)
RDW: 14.2 % (ref 11.5–15.5)
WBC: 11.1 10*3/uL — ABNORMAL HIGH (ref 4.0–10.5)
nRBC: 0 % (ref 0.0–0.2)

## 2022-12-20 LAB — POCT FERN TEST: POCT Fern Test: NEGATIVE

## 2022-12-20 LAB — MAGNESIUM: Magnesium: 1.8 mg/dL (ref 1.7–2.4)

## 2022-12-20 MED ORDER — LACTATED RINGERS IV BOLUS
1000.0000 mL | Freq: Once | INTRAVENOUS | Status: AC
  Administered 2022-12-20: 1000 mL via INTRAVENOUS

## 2022-12-20 NOTE — Discharge Instructions (Addendum)
-   Please restart oral iron - Miralax is the powder medication for constipation. This can be found over the counter. You can take 1-2 times per day as needed - You will receive a call from the [facility] cardiologist to get scheduled

## 2022-12-20 NOTE — MAU Provider Note (Signed)
History     CSN: [id number]  Arrival date and time: 12/20/22 1840   Event Date/Time   First Provider Initiated Contact with Patient 12/20/22 1923      Chief Complaint  Patient presents with   Contractions   Rupture of Membranes   Dizziness   Dizziness  Patient presents with episode of lightheadedness and dizziness on the toilet today. Has felt off since one this afternoon with feeling a headache when she stands that resolves when she sits. She then tried to have a bowel movement. Has strained as she is constipated. Then felt much more lightheaded and had spots in her vision. No falls or injuries. FOB helped her lay down on the bed, and noted her eyes twitched and had a bounding pulse. Notes history of anemia during this pregnancy. Took iron for ~1 month, then stopped.  Ate and drank well today. Denies cardiac and neurologic disorders. Is taking Procardia XL 30 mg every day for preterm contractions.  Denies urinary symptoms, fever/chills, DFM, vaginal bleeding, CP, SOB, N/V. Notes increase in vaginal discharge today. Clear. No itching, change in odor.   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   On adderall prior to pregnancy, stopped w/ +PT, doing well   Autism spectrum disorder 12/25/2010   Borderline personality disorder (HCC) 06/03/2018   Chronic left-sided low back pain without sciatica 02/06/2022   Chronic migraine with aura 09/27/2019   Chronic post-traumatic stress disorder 12/25/2010   Depression with anxiety 10/03/2015   07/20/22 No meds, doing well, [facility] referral ordered   Episode of shaking 09/27/2019   Major depressive disorder 04/13/2017   PTSD (post-traumatic stress disorder)    Syncopal episodes     Past Surgical History:  Procedure Laterality Date   CAUTERIZE INNER NOSE      CERVICAL CERCLAGE N/A 04/14/2019   Procedure: CERCLAGE CERVICAL;  Surgeon: Noralee Space, MD;  Location: [facility] LD ORS;  Service: Gynecology;  Laterality: N/A;   DENTAL SURGERY     MOUTH SURGERY     WISDOM TOOTH EXTRACTION      Family History  Problem Relation Age of Onset   Depression Mother    Anxiety disorder Mother    ADD / ADHD Mother    Drug abuse Father    Depression Sister    Depression Maternal Grandmother    Jaundice Daughter     Social History   Tobacco Use   Smoking status: Never   Smokeless tobacco: Never  Vaping Use   Vaping status: Never Used  Substance Use Topics   Alcohol use: Not Currently    Comment: occ   Drug use: No    Allergies:  Allergies  Allergen Reactions   Latex Itching and Rash    Medications Prior to Admission  Medication Sig Dispense Refill Last Dose   famotidine (PEPCID) 20 MG tablet TAKE ONE TABLET TWICE DAILY 60 tablet 1 12/20/2022   NIFEdipine (PROCARDIA XL) 30 MG 24 hr tablet Take 1 tablet (30 mg total) by mouth daily. 30 tablet 0 12/20/2022   prenatal vitamin w/FE, FA (PRENATAL 1 + 1) 27-1 MG TABS tablet Take 1 tablet by mouth daily at 12 noon. 30 tablet 12 12/20/2022   progesterone (PROMETRIUM) 200 MG capsule Place 1 capsule (200 mg total) vaginally at bedtime. 30 capsule 5 Past Week  acetaminophen (TYLENOL) 500 MG tablet Take 500 mg by mouth every 6 (six) hours as needed for mild pain or headache.      albuterol (VENTOLIN HFA) 108 (90 Base) MCG/ACT inhaler Inhale 2 puffs into the lungs every 6 (six) hours as needed for wheezing or shortness of breath. (Patient not taking: Reported on 11/04/2022)      cyclobenzaprine (FLEXERIL) 10 MG tablet Take 1 tablet (10 mg total) by mouth 2 (two) times daily as needed for muscle spasms. 20 tablet 0    doxylamine, Sleep, (UNISOM) 25 MG tablet Take 25 mg by mouth at bedtime as needed.      ferrous sulfate 325 (65 FE) MG EC tablet Take 1 tablet (325 mg total) by mouth every other day. 60 tablet 2      Review of Systems  Neurological:  Positive for dizziness.   See HPI.  Physical Exam   Blood pressure 119/75, pulse 86, temperature 98 F (36.7 C), temperature source Oral, resp. rate 17, last menstrual period 04/26/2022, SpO2 100%.  Physical Exam Constitutional:      General: She is not in acute distress.    Appearance: She is not ill-appearing.     Comments: Appears pale  HENT:     Head: Normocephalic and atraumatic.     Mouth/Throat:     Mouth: Mucous membranes are moist.     Pharynx: Oropharynx is clear.  Eyes:     Extraocular Movements: Extraocular movements intact.  Cardiovascular:     Rate and Rhythm: Normal rate and regular rhythm.     Pulses: Normal pulses.     Heart sounds: Normal heart sounds.  Pulmonary:     Effort: Pulmonary effort is normal.     Breath sounds: Normal breath sounds.  Abdominal:     General: Bowel sounds are normal.     Tenderness: There is no abdominal tenderness.     Comments: Gravid  Musculoskeletal:        General: No swelling.  Skin:    General: Skin is warm and dry.     Capillary Refill: Capillary refill takes less than 2 seconds.  Neurological:     General: No focal deficit present.     Mental Status: Mental status is at baseline.  Psychiatric:        Mood and Affect: Mood normal.    Results for orders placed or performed during the hospital encounter of 12/20/22 (from the past 24 hour(s))  Urinalysis, Routine w reflex microscopic -Urine, Clean Catch     Status: Abnormal   Collection Time: 12/20/22  7:29 PM  Result Value Ref Range   Color, Urine AMBER (A) YELLOW   APPearance HAZY (A) CLEAR   Specific Gravity, Urine 1.028 1.005 - 1.030   pH 5.0 5.0 - 8.0   Glucose, UA NEGATIVE NEGATIVE mg/dL   Hgb urine dipstick NEGATIVE NEGATIVE   Bilirubin Urine NEGATIVE NEGATIVE   Ketones, ur 5 (A) NEGATIVE mg/dL   Protein, ur 30 (A) NEGATIVE mg/dL   Nitrite NEGATIVE NEGATIVE   Leukocytes,Ua SMALL (A) NEGATIVE   RBC / HPF 0-5 0 - 5  RBC/hpf   WBC, UA 6-10 0 - 5 WBC/hpf   Bacteria, UA RARE (A) NONE SEEN   Squamous Epithelial / HPF 11-20 0 - 5 /HPF   Mucus PRESENT   CBC     Status: Abnormal   Collection Time: 12/20/22  8:19 PM  Result Value Ref Range   WBC 11.1 (H) 4.0 - 10.5 K/uL  RBC 4.06 3.87 - 5.11 MIL/uL   Hemoglobin 10.3 (L) 12.0 - 15.0 g/dL   HCT 78.2 (L) 95.6 - 21.3 %   MCV 78.6 (L) 80.0 - 100.0 fL   MCH 25.4 (L) 26.0 - 34.0 pg   MCHC 32.3 30.0 - 36.0 g/dL   RDW 08.6 57.8 - 46.9 %   Platelets 314 150 - 400 K/uL   nRBC 0.0 0.0 - 0.2 %  Comprehensive metabolic panel     Status: Abnormal   Collection Time: 12/20/22  8:19 PM  Result Value Ref Range   Sodium 133 (L) 135 - 145 mmol/L   Potassium 3.5 3.5 - 5.1 mmol/L   Chloride 102 98 - 111 mmol/L   CO2 19 (L) 22 - 32 mmol/L   Glucose, Bld 75 70 - 99 mg/dL   BUN 7 6 - 20 mg/dL   Creatinine, Ser 6.29 (L) 0.44 - 1.00 mg/dL   Calcium 9.2 8.9 - 52.8 mg/dL   Total Protein 6.6 6.5 - 8.1 g/dL   Albumin 2.6 (L) 3.5 - 5.0 g/dL   AST 20 15 - 41 U/L   ALT 17 0 - 44 U/L   Alkaline Phosphatase 92 38 - 126 U/L   Total Bilirubin 0.2 (L) 0.3 - 1.2 mg/dL   GFR, Estimated >41 >32 mL/min   Anion gap 12 5 - 15  Magnesium     Status: None   Collection Time: 12/20/22  8:19 PM  Result Value Ref Range   Magnesium 1.8 1.7 - 2.4 mg/dL  POCT fern test     Status: Normal   Collection Time: 12/20/22  8:46 PM  Result Value Ref Range   POCT Fern Test Negative = intact amniotic membranes     FWB: Reactive. FHT 135/moderate variability/+accels/no decels. Mild uterine irritability  EKG: HR 89. Normal rhythm, axis, intervals. No ST changes  MAU Course  Procedures  MDM Patient presents with picture consistent with vasovagal presyncopal event considering it occurred after straining with lightheadedness, change in vision, and resolution with lying flat. Do suspect anemia may be contributing. Will also evaluate for UTI, e'lyte abnormalities, orthostasis. Do not suspect malignant  arrhythmia, ACS, seizure to be underlying etiology. Gave 1 L of fluids given spec grav of 1.028 and low normal Bps. Additionally r/o rupture.  Given non-compelling history for rupture, negative fern, and already threatened preterm labor with cervix 3.5/50/ballotable previously, will not pursue further swabs/spec exam/digital exam to minimize cervix irritation.  2111: patient felt improved with above interventions. Discussed largely normal results and clinical picture most consistent with vasovagal near syncopal event. To restart oral iron. Will discontinue Procardia XL at this time, as patient did not feel it helped with contractions and may be contributing to presyncopal event. Has close follow-up with primary OB. Offered a referral to [facility] cardiology for likely Zio patch which patient accepted. Extensive education provided and close return precautions discussed. Pt and FOB verbalized understanding of plan.  Assessment and Plan   #Vasovagal near syncope: Close return precautions discussed. To stop procardia XL. OB cards referral placed.  #Anemia: Hgb 10.3. Instructed to restart PO iron. Can start miralax for associated constipation.  #Membranes intact: fern negative.   Joanne Gavel, MD 12/20/2022, 9:15 PM

## 2022-12-20 NOTE — MAU Note (Signed)
.  Audrey Matthews is a 22 y.o. at [redacted]w[redacted]d here in MAU reporting: headache when standing that resolves when sitting, tunnel vision, dizziness that all started around 1300.  Pt reports LOF since 0700 this morning.  Reports ctx q8-10 min.     Pain score: 5 Vitals:   12/20/22 1852  BP: 114/69  Pulse: 96  Resp: 17  Temp: 98 F (36.7 C)  SpO2: (!) 82%     FHT:155 Lab orders placed from triage:

## 2022-12-21 LAB — CULTURE, OB URINE

## 2022-12-23 ENCOUNTER — Encounter: Payer: Self-pay | Admitting: Women's Health

## 2022-12-23 ENCOUNTER — Ambulatory Visit (INDEPENDENT_AMBULATORY_CARE_PROVIDER_SITE_OTHER): Payer: MEDICAID | Admitting: Women's Health

## 2022-12-23 VITALS — BP 117/73 | HR 97 | Wt 169.0 lb

## 2022-12-23 DIAGNOSIS — Z3483 Encounter for supervision of other normal pregnancy, third trimester: Secondary | ICD-10-CM

## 2022-12-23 NOTE — Progress Notes (Signed)
LOW-RISK PREGNANCY VISIT Patient name: Audrey Matthews MRN [id number]  Date of birth: 08-Jul-2000 Chief Complaint:   Routine Prenatal Visit  History of Present Illness:   Audrey Matthews is a 22 y.o. [phone] female at [redacted]w[redacted]d with an Estimated Date of Delivery: 01/31/23 being seen today for ongoing management of a low-risk pregnancy.   Today she reports  tired, dizzy, occ sob-went to MAU 9/29 for same, hgb 10.3- taking po Fe every other day, instructed to stop procardia for preterm contractions, referral to OB cards, hasn't heard from them yet . Contractions: Irregular. Vag. Bleeding: None.  Movement: Present. denies leaking of fluid.     07/20/2022   10:01 AM 11/05/2020    1:43 PM 02/09/2019    9:50 AM 12/27/2018    1:48 PM  Depression screen PHQ 2/9  Decreased Interest 0 1 0 1  Down, Depressed, Hopeless 0 1 0 1  PHQ - 2 Score 0 2 0 2  Altered sleeping 1 1 1  0  Tired, decreased energy 1 1 1 1   Change in appetite 2 0 0 2  Feeling bad or failure about yourself  0 1 0 1  Trouble concentrating 1 1 2  0  Moving slowly or fidgety/restless 0 0 3 1  Suicidal thoughts 0 0 0 0  PHQ-9 Score 5 6 7 7   Difficult doing work/chores   Somewhat difficult         07/20/2022   10:01 AM 11/05/2020    1:43 PM  GAD 7 : Generalized Anxiety Score  Nervous, Anxious, on Edge 1 1  Control/stop worrying 2 1  Worry too much - different things 1 1  Trouble relaxing 1 1  Restless 0 1  Easily annoyed or irritable 2 2  Afraid - awful might happen 1 1  Total GAD 7 Score 8 8      Review of Systems:   Pertinent items are noted in HPI Denies abnormal vaginal discharge w/ itching/odor/irritation, headaches, visual changes, shortness of breath, chest pain, abdominal pain, severe nausea/vomiting, or problems with urination or bowel movements unless otherwise stated above. Pertinent History Reviewed:  Reviewed past medical,surgical, social, obstetrical and family history.  Reviewed problem list, medications and  allergies. Physical Assessment:   Vitals:   12/23/22 1342  BP: 117/73  Pulse: 97  Weight: 169 lb (76.7 kg)  Body mass index is 31.93 kg/m.        Physical Examination:   General appearance: Well appearing, and in no distress  Mental status: Alert, oriented to person, place, and time  Skin: Warm & dry  Cardiovascular: Normal heart rate noted, HRRR  Respiratory: Normal respiratory effort, no distress, LCTAB  Abdomen: Soft, gravid, nontender  Pelvic: Cervical exam deferred         Extremities: Edema: None  Fetal Status: Fetal Heart Rate (bpm): 152 Fundal Height: 34 cm Movement: Present    Chaperone: N/A   No results found for this or any previous visit (from the past 24 hour(s)).  Assessment & Plan:  1) Low-risk pregnancy G3P0111 at [redacted]w[redacted]d with an Estimated Date of Delivery: 01/31/23   2) Fatigue, dizziness, sob, evaluated for same 9/29 in MAU, slightly anemic-taking po Fe every other day, stopped procardia for preterm contractions as directed. Has OB cards referral pending, let us know if doesn't hear from them by next week. Stay hydrated, eat small frequent snacks w/ protein, change positions slowly. Reviewed warning s/s, reasons to seek care  3) H/O PTB   Meds: No  orders of the defined types were placed in this encounter.  Labs/procedures today: none  Plan:  Continue routine obstetrical care  Next visit: prefers in person    Reviewed: Preterm labor symptoms and general obstetric precautions including but not limited to vaginal bleeding, contractions, leaking of fluid and fetal movement were reviewed in detail with the patient.  All questions were answered. Does have home bp cuff. Office bp cuff given: not applicable. Check bp weekly, let us know if consistently >140 and/or >90.  Follow-up: Return for As scheduled.  Future Appointments  Date Time Provider Department Center  12/30/2022  2:50 PM Arabella Merles, CNM [facility]  01/06/2023  2:50 PM Cheral Marker, CNM  [facility]  01/13/2023  2:50 PM Cheral Marker, CNM [facility]  01/20/2023  2:50 PM Cheral Marker, CNM [facility]  01/27/2023  2:50 PM Arabella Merles, CNM [facility]    No orders of the defined types were placed in this encounter.  Cheral Marker CNM, [facility] 12/23/2022 2:09 PM

## 2022-12-23 NOTE — Patient Instructions (Signed)
Mon[place]ie, thank you for choosing our office today! We appreciate the opportunity to meet your healthcare needs. You may receive a short survey by mail, e-mail, or through [employer]. If you are happy with your care we would appreciate if you could take just a few minutes to complete the survey questions. We read all of your comments and take your feedback very seriously. Thank you again for choosing our office.  Center for [employer] Team at [facility]  [facility] at [facility] ([address] [place], [place] [address]) Entrance C, located off of [address] Free 24/7 valet parking   CLASSES: Go to [email or web address] to register for classes (childbirth, breastfeeding, waterbirth, infant CPR, daddy bootcamp, etc.)  Call the office [phone]) or go to [facility] if: You begin to have strong, frequent contractions Your water breaks.  Sometimes it is a big gush of fluid, sometimes it is just a trickle that keeps getting your panties wet or running down your legs You have vaginal bleeding.  It is normal to have a small amount of spotting if your cervix was checked.  You don't feel your baby moving like normal.  If you don't, get you something to eat and drink and lay down and focus on feeling your baby move.   If your baby is still not moving like normal, you should call the office or go to [facility].  Call the office [phone]) or go to [facility] hospital for these signs of pre-eclampsia: Severe headache that does not go away with Tylenol Visual changes- seeing spots, double, blurred vision Pain under your right breast or upper abdomen that does not go away with Tums or heartburn medicine Nausea and/or vomiting Severe swelling in your hands, feet, and face   Tdap Vaccine It is recommended that you get the Tdap vaccine during the third trimester of EACH pregnancy to help protect your baby from getting pertussis (whooping cough) 27-36 weeks is the BEST time to do  this so that you can pass the protection on to your baby. During pregnancy is better than after pregnancy, but if you are unable to get it during pregnancy it will be offered at the hospital.  You can get this vaccine with [place], at the health department, your family doctor, or some local pharmacies Everyone who will be around your baby should also be up-to-date on their vaccines before the baby comes. Adults (who are not pregnant) only need 1 dose of Tdap during adulthood.   [facility] Pediatricians/Family Doctors [facility]): [address] Colette Ribas, [phone]           [facility]: [address], [phone]                [facility] Medicine [facility]): [address], [phone] (call to ask if accepting patients) [facility] Department: [address], [place], [phone]    [facility] Pediatricians/Family Doctors [facility]): [phone] [address], [phone] [facility]: [address] [place], [phone] [facility]: [address], [phone]  [facility] Doctors  [facility]): [phone] [facility]: [address] [phone]   [facility] Doctors [facility]: [address] [phone]  [facility] Family Doctors  [facility]: [phone], [phone]  Home Blood Pressure Monitoring for Patients   Your provider has recommended that you check your  blood pressure (BP) at least once a week at home. If you do not have a blood pressure cuff at home, one will be provided for you. Contact your provider if you have not received your monitor within 1 week.   Helpful Tips for Accurate Home Blood Pressure Checks  Don't smoke, exercise, or drink caffeine 30 minutes before checking your BP Use the restroom before checking your BP (a full bladder can raise your  pressure) Relax in a comfortable upright chair Feet on the ground Left arm resting comfortably on a flat surface at the level of your heart Legs uncrossed Back supported Sit quietly and don't talk Place the cuff on your bare arm Adjust snuggly, so that only two fingertips can fit between your skin and the top of the cuff Check 2 readings separated by at least one minute Keep a log of your BP readings For a visual, please reference this diagram: http://ccnc.care/bpdiagram  Provider Name: [facility]     Phone: [phone]  Zone 1: ALL CLEAR  Continue to monitor your symptoms:  BP reading is less than 140 (top number) or less than 90 (bottom number)  No right upper stomach pain No headaches or seeing spots No feeling nauseated or throwing up No swelling in face and hands  Zone 2: CAUTION Call your doctor's office for any of the following:  BP reading is greater than 140 (top number) or greater than 90 (bottom number)  Stomach pain under your ribs in the middle or right side Headaches or seeing spots Feeling nauseated or throwing up Swelling in face and hands  Zone 3: EMERGENCY  Seek immediate medical care if you have any of the following:  BP reading is greater than160 (top number) or greater than 110 (bottom number) Severe headaches not improving with Tylenol Serious difficulty catching your breath Any worsening symptoms from Zone 2  Preterm Labor and Birth Information  The normal length of a pregnancy is 39-41 weeks. Preterm labor is when labor starts before 37 completed weeks of pregnancy. What are the risk factors for preterm labor? Preterm labor is more likely to occur in women who: Have certain infections during pregnancy such as a bladder infection, sexually transmitted infection, or infection inside the uterus (chorioamnionitis). Have a shorter-than-normal cervix. Have gone into preterm labor before. Have had surgery on their cervix. Are younger than age 17  or older than age 35. Are African American. Are pregnant with twins or multiple babies (multiple gestation). Take street drugs or smoke while pregnant. Do not gain enough weight while pregnant. Became pregnant shortly after having been pregnant. What are the symptoms of preterm labor? Symptoms of preterm labor include: Cramps similar to those that can happen during a menstrual period. The cramps may happen with diarrhea. Pain in the abdomen or lower back. Regular uterine contractions that may feel like tightening of the abdomen. A feeling of increased pressure in the pelvis. Increased watery or bloody mucus discharge from the vagina. Water breaking (ruptured amniotic sac). Why is it important to recognize signs of preterm labor? It is important to recognize signs of preterm labor because babies who are born prematurely may not be fully developed. This can put them at an increased risk for: Long-term (chronic) heart and lung problems. Difficulty immediately after birth with regulating body systems, including blood sugar, body temperature, heart rate, and breathing rate. Bleeding in the brain. Cerebral palsy. Learning difficulties. Death. These risks are highest for babies who are born before 34 weeks   of pregnancy. How is preterm labor treated? Treatment depends on the length of your pregnancy, your condition, and the health of your baby. It may involve: Having a stitch (suture) placed in your cervix to prevent your cervix from opening too early (cerclage). Taking or being given medicines, such as: Hormone medicines. These may be given early in pregnancy to help support the pregnancy. Medicine to stop contractions. Medicines to help mature the baby's lungs. These may be prescribed if the risk of delivery is high. Medicines to prevent your baby from developing cerebral palsy. If the labor happens before 34 weeks of pregnancy, you may need to stay in the hospital. What should I do if I  think I am in preterm labor? If you think that you are going into preterm labor, call your health care provider right away. How can I prevent preterm labor in future pregnancies? To increase your chance of having a full-term pregnancy: Do not use any tobacco products, such as cigarettes, chewing tobacco, and e-cigarettes. If you need help quitting, ask your health care provider. Do not use street drugs or medicines that have not been prescribed to you during your pregnancy. Talk with your health care provider before taking any herbal supplements, even if you have been taking them regularly. Make sure you gain a healthy amount of weight during your pregnancy. Watch for infection. If you think that you might have an infection, get it checked right away. Make sure to tell your health care provider if you have gone into preterm labor before. This information is not intended to replace advice given to you by your health care provider. Make sure you discuss any questions you have with your health care provider. Document Revised: 07/01/2018 Document Reviewed: 07/31/2015 Elsevier Patient Education  2020 Elsevier Inc.   

## 2022-12-27 ENCOUNTER — Encounter (HOSPITAL_COMMUNITY): Payer: Self-pay | Admitting: Obstetrics and Gynecology

## 2022-12-27 ENCOUNTER — Inpatient Hospital Stay (HOSPITAL_COMMUNITY)
Admission: AD | Admit: 2022-12-27 | Discharge: 2022-12-28 | Disposition: A | Discharge status: Home / Self Care | Payer: MEDICAID | Attending: Obstetrics and Gynecology | Admitting: Obstetrics and Gynecology

## 2022-12-27 ENCOUNTER — Other Ambulatory Visit: Payer: Self-pay

## 2022-12-27 DIAGNOSIS — O4703 False labor before 37 completed weeks of gestation, third trimester: Secondary | ICD-10-CM | POA: Diagnosis present

## 2022-12-27 DIAGNOSIS — Z0371 Encounter for suspected problem with amniotic cavity and membrane ruled out: Secondary | ICD-10-CM | POA: Insufficient documentation

## 2022-12-27 DIAGNOSIS — Z3A35 35 weeks gestation of pregnancy: Secondary | ICD-10-CM | POA: Diagnosis not present

## 2022-12-27 LAB — URINALYSIS, ROUTINE W REFLEX MICROSCOPIC
Bilirubin Urine: NEGATIVE
Glucose, UA: NEGATIVE mg/dL
Hgb urine dipstick: NEGATIVE
Ketones, ur: NEGATIVE mg/dL
Nitrite: NEGATIVE
Protein, ur: NEGATIVE mg/dL
Specific Gravity, Urine: 1.02 (ref 1.005–1.030)
pH: 7 (ref 5.0–8.0)

## 2022-12-27 NOTE — MAU Provider Note (Incomplete)
History     CSN: [id number]  Arrival date and time: 12/27/22 2207   None     Chief Complaint  Patient presents with  . Contractions   HPI  {GYN/OB [id number]}  Past Medical History:  Diagnosis Date  . ADHD (attention deficit hyperactivity disorder)   . Alteration consciousness 09/27/2019  . Anxiety   . Asthma   . 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 Autism spectrum disorder 12/25/2010  . Borderline personality disorder (HCC) 06/03/2018  . Chronic left-sided low back pain without sciatica 02/06/2022  . Chronic migraine with aura 09/27/2019  . Chronic post-traumatic stress disorder 12/25/2010  . Depression with anxiety 10/03/2015   07/20/22 No meds, doing well, [facility] referral ordered  . Episode of shaking 09/27/2019  . Major depressive disorder 04/13/2017  . PTSD (post-traumatic stress disorder)   . Syncopal episodes     Past Surgical History:  Procedure Laterality Date  . CAUTERIZE INNER NOSE    . CERVICAL CERCLAGE N/A 04/14/2019   Procedure: CERCLAGE CERVICAL;  Surgeon: Noralee Space, MD;  Location: [facility];  Service: Gynecology;  Laterality: N/A;  . DENTAL SURGERY    . MOUTH SURGERY    . WISDOM TOOTH EXTRACTION      Family History  Problem Relation Age of Onset  . Depression Mother   . Anxiety disorder Mother   . ADD / ADHD Mother   . Drug abuse Father   . Depression Sister   . Depression Maternal Grandmother   . Jaundice Daughter     Social History   Tobacco Use  . Smoking status: Never  . Smokeless tobacco: Never  Vaping Use  . Vaping status: Never Used  Substance Use Topics  . Alcohol use: Not Currently    Comment: occ  . Drug use: No    Allergies:  Allergies  Allergen Reactions  . Latex Itching and Rash    Medications Prior to Admission  Medication Sig Dispense Refill Last Dose  . doxylamine, Sleep, (UNISOM) 25 MG tablet Take 25 mg by  mouth at bedtime as needed.   12/26/2022  . ferrous sulfate 325 (65 FE) MG EC tablet Take 1 tablet (325 mg total) by mouth every other day. 60 tablet 2 12/26/2022  . prenatal vitamin w/FE, FA (PRENATAL 1 + 1) 27-1 MG TABS tablet Take 1 tablet by mouth daily at 12 noon. 30 tablet 12 12/27/2022  . progesterone (PROMETRIUM) 200 MG capsule Place 1 capsule (200 mg total) vaginally at bedtime. 30 capsule 5 Past Week  . acetaminophen (TYLENOL) 500 MG tablet Take 500 mg by mouth every 6 (six) hours as needed for mild pain or headache.     . albuterol (VENTOLIN HFA) 108 (90 Base) MCG/ACT inhaler Inhale 2 puffs into the lungs every 6 (six) hours as needed for wheezing or shortness of breath. (Patient not taking: Reported on 11/04/2022)     . cyclobenzaprine (FLEXERIL) 10 MG tablet Take 1 tablet (10 mg total) by mouth 2 (two) times daily as needed for muscle spasms. 20 tablet 0   . famotidine (PEPCID) 20 MG tablet TAKE ONE TABLET TWICE DAILY 60 tablet 1     Review of Systems Physical Exam   Blood pressure 117/72, pulse 79, temperature 98.2 F (36.8 C), temperature source Oral, resp. rate 18, height 5\' 1"  (1.549 m), weight 77.5 kg, last menstrual period 04/26/2022, SpO2 99%.  Physical Exam  MAU  Course  Procedures  MDM Wet Prep GC/CT -- Results pending   Results for orders placed or performed during the hospital encounter of 12/27/22 (from the past 24 hour(s))  Urinalysis, Routine w reflex microscopic -Urine, Clean Catch     Status: Abnormal   Collection Time: 12/27/22 10:21 PM  Result Value Ref Range   Color, Urine YELLOW YELLOW   APPearance HAZY (A) CLEAR   Specific Gravity, Urine 1.020 1.005 - 1.030   pH 7.0 5.0 - 8.0   Glucose, UA NEGATIVE NEGATIVE mg/dL   Hgb urine dipstick NEGATIVE NEGATIVE   Bilirubin Urine NEGATIVE NEGATIVE   Ketones, ur NEGATIVE NEGATIVE mg/dL   Protein, ur NEGATIVE NEGATIVE mg/dL   Nitrite NEGATIVE NEGATIVE   Leukocytes,Ua TRACE (A) NEGATIVE   RBC / HPF 0-5 0 - 5  RBC/hpf   WBC, UA 0-5 0 - 5 WBC/hpf   Bacteria, UA RARE (A) NONE SEEN   Squamous Epithelial / HPF 6-10 0 - 5 /HPF   Mucus PRESENT     Assessment and Plan  ***  Raelyn Mora, CNM 12/27/2022, 11:38 PM

## 2022-12-27 NOTE — MAU Provider Note (Signed)
History     CSN: [id number]  Arrival date and time: 12/27/22 2207   None     Chief Complaint  Patient presents with   Contractions   HPI [name] is a 22 y.o. year old G68P0111 female at [redacted] weeks gestation who presents to MAU reporting contractions every 3 to 6 minutes for the last hour.  She also reports that she has been leaking for the past 2 weeks.  She states "they said it was nothing."  She reports having a watery "occasional thick" white discharge that is clear when it is watery.  She rates her contraction pain 7 out of 10.  She denies any vaginal bleeding.  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   On adderall prior to pregnancy, stopped w/ +PT, doing well   Autism spectrum disorder 12/25/2010   Borderline personality disorder (HCC) 06/03/2018   Chronic left-sided low back pain without sciatica 02/06/2022   Chronic migraine with aura 09/27/2019   Chronic post-traumatic stress disorder 12/25/2010   Depression with anxiety 10/03/2015   07/20/22 No meds, doing well, [facility] referral ordered   Episode of shaking 09/27/2019   Major depressive disorder 04/13/2017   PTSD (post-traumatic stress disorder)    Syncopal episodes     Past Surgical History:  Procedure Laterality Date   CAUTERIZE INNER NOSE     CERVICAL CERCLAGE N/A 04/14/2019   Procedure: CERCLAGE CERVICAL;  Surgeon: Noralee Space, MD;  Location: [facility];  Service: Gynecology;  Laterality: N/A;   DENTAL SURGERY     MOUTH SURGERY     WISDOM TOOTH EXTRACTION      Family History  Problem Relation Age of Onset   Depression Mother    Anxiety  disorder Mother    ADD / ADHD Mother    Drug abuse Father    Depression Sister    Depression Maternal Grandmother    Jaundice Daughter     Social History   Tobacco Use   Smoking status: Never   Smokeless tobacco: Never  Vaping Use   Vaping status: Never Used  Substance Use Topics   Alcohol use: Not Currently    Comment: occ   Drug use: No    Allergies:  Allergies  Allergen Reactions   Latex Itching and Rash    Medications Prior to Admission  Medication Sig Dispense Refill Last Dose   doxylamine, Sleep, (UNISOM) 25 MG tablet Take 25 mg by mouth at bedtime as needed.   12/26/2022   ferrous sulfate 325 (65 FE) MG EC tablet Take 1 tablet (325 mg total) by mouth every other day. 60 tablet 2 12/26/2022   prenatal vitamin w/FE, FA (PRENATAL 1 + 1) 27-1 MG TABS tablet Take 1 tablet by mouth daily at 12 noon. 30 tablet 12 12/27/2022   progesterone (PROMETRIUM) 200 MG capsule Place 1 capsule (200 mg total) vaginally at  bedtime. 30 capsule 5 Past Week   acetaminophen (TYLENOL) 500 MG tablet Take 500 mg by mouth every 6 (six) hours as needed for mild pain or headache.      albuterol (VENTOLIN HFA) 108 (90 Base) MCG/ACT inhaler Inhale 2 puffs into the lungs every 6 (six) hours as needed for wheezing or shortness of breath. (Patient not taking: Reported on 11/04/2022)      cyclobenzaprine (FLEXERIL) 10 MG tablet Take 1 tablet (10 mg total) by mouth 2 (two) times daily as needed for muscle spasms. 20 tablet 0    famotidine (PEPCID) 20 MG tablet TAKE ONE TABLET TWICE DAILY 60 tablet 1     Review of Systems  Constitutional: Negative.   HENT: Negative.    Eyes: Negative.   Respiratory: Negative.    Cardiovascular: Negative.   Endocrine: Negative.   Genitourinary:  Positive for pelvic pain (contractions every 3-6 mins x 1 hour) and vaginal discharge (x 2 wks; watery>clear and "occ thick and white").  Musculoskeletal: Negative.   Skin: Negative.   Allergic/Immunologic: Negative.    Neurological: Negative.   Hematological: Negative.   Psychiatric/Behavioral: Negative.     Physical Exam   Blood pressure 117/72, pulse 79, temperature 98.2 F (36.8 C), temperature source Oral, resp. rate 18, height 5\' 1"  (1.549 m), weight 77.5 kg, last menstrual period 04/26/2022, SpO2 99%.  Physical Exam Vitals and nursing note reviewed. Exam conducted with a chaperone present.  Constitutional:      Appearance: Normal appearance. She is normal weight.  Genitourinary:    General: Normal vulva.     Comments: Pelvic exam: External genitalia normal, SE: vaginal walls pink and well rugated, cervix is smooth, pink, no lesions, moderate amt of yellowish to clear mucoid vaginal d/c -- WP, GC/CT done, Uterus is non-tender, S=D, no CMT or friability, no adnexal tenderness.  Dilation: 3.5 Effacement (%): 70 Cervical Position: Middle Station: [place], -3 Presentation: Vertex Exam by:: Carloyn Jaeger, CNM  Musculoskeletal:        General: Normal range of motion.  Skin:    General: Skin is warm and dry.  Neurological:     Mental Status: She is alert.   REACTIVE NST - FHR: 130 bpm / moderate variability / accels present / decels absent / TOCO: irregular every 3-7 mins   MAU Course  Procedures  MDM Wet Prep GC/CT -- Results pending   Results for orders placed or performed during the hospital encounter of 12/27/22 (from the past 24 hour(s))  Urinalysis, Routine w reflex microscopic -Urine, Clean Catch     Status: Abnormal   Collection Time: 12/27/22 10:21 PM  Result Value Ref Range   Color, Urine YELLOW YELLOW   APPearance HAZY (A) CLEAR   Specific Gravity, Urine 1.020 1.005 - 1.030   pH 7.0 5.0 - 8.0   Glucose, UA NEGATIVE NEGATIVE mg/dL   Hgb urine dipstick NEGATIVE NEGATIVE   Bilirubin Urine NEGATIVE NEGATIVE   Ketones, ur NEGATIVE NEGATIVE mg/dL   Protein, ur NEGATIVE NEGATIVE mg/dL   Nitrite NEGATIVE NEGATIVE   Leukocytes,Ua TRACE (A) NEGATIVE   RBC / HPF 0-5 0 - 5 RBC/hpf    WBC, UA 0-5 0 - 5 WBC/hpf   Bacteria, UA RARE (A) NONE SEEN   Squamous Epithelial / HPF 6-10 0 - 5 /HPF   Mucus PRESENT     Assessment and Plan  1. Preterm uterine contractions in third trimester, antepartum   2. No leakage of amniotic fluid into vagina - Advised of negative  tests for ruptured membranes - Reassurance given that having thin vaginal discharge is normal variation at this stage in pregnancy.  3. [redacted] weeks gestation of pregnancy  - Discharge patient - [facility] on December 30, 2022 - Patient verbalized an understanding of the plan of care and agrees.    Raelyn Mora, CNM 12/27/2022, 11:38 PM

## 2022-12-27 NOTE — MAU Note (Deleted)
Dr.Ervin back in with patient discussing plan of care and need for transfer to [facility] hospital

## 2022-12-27 NOTE — MAU Note (Signed)
.  Audrey Matthews is a 22 y.o. at [redacted]w[redacted]d here in MAU reporting: ctx 3-6 minutes for the past hour. Denies VB, but reports "I've been leaking for 2 weeks, but they said it was nothing. It's hard to tell if it's nothing or something new" - reports watery discharge, states discharge is "occasionally thick". "When it's watery, it's clear. When it has the thickness, it's white". +FM.   Onset of complaint: 2115 Pain score: 7 Vitals:   12/27/22 2217  BP: 112/78  Pulse: 95  Resp: 18  Temp: 98.2 F (36.8 C)  SpO2: 100%     FHT:158 Lab orders placed from triage:  UA

## 2022-12-28 ENCOUNTER — Ambulatory Visit: Payer: MEDICAID | Admitting: Cardiology

## 2022-12-28 ENCOUNTER — Encounter: Payer: Self-pay | Admitting: Obstetrics & Gynecology

## 2022-12-28 DIAGNOSIS — Z3A35 35 weeks gestation of pregnancy: Secondary | ICD-10-CM | POA: Diagnosis not present

## 2022-12-28 DIAGNOSIS — O4703 False labor before 37 completed weeks of gestation, third trimester: Secondary | ICD-10-CM | POA: Diagnosis not present

## 2022-12-28 LAB — GC/CHLAMYDIA PROBE AMP (~~LOC~~) NOT AT ARMC
Chlamydia: NEGATIVE
Comment: NEGATIVE
Comment: NORMAL
Neisseria Gonorrhea: NEGATIVE

## 2022-12-28 LAB — WET PREP, GENITAL
Clue Cells Wet Prep HPF POC: NONE SEEN
Sperm: NONE SEEN
Trich, Wet Prep: NONE SEEN
WBC, Wet Prep HPF POC: >=10 — AB (ref <10)
Yeast Wet Prep HPF POC: NONE SEEN

## 2022-12-28 LAB — FETAL FIBRONECTIN: Fetal Fibronectin: POSITIVE — AB

## 2022-12-28 MED ORDER — LACTATED RINGERS IV BOLUS
1000.0000 mL | Freq: Once | INTRAVENOUS | Status: AC
  Administered 2022-12-28: 1000 mL via INTRAVENOUS

## 2022-12-30 ENCOUNTER — Ambulatory Visit: Payer: MEDICAID

## 2022-12-30 ENCOUNTER — Ambulatory Visit: Payer: MEDICAID | Attending: Cardiology | Admitting: Cardiology

## 2022-12-30 ENCOUNTER — Encounter: Payer: Self-pay | Admitting: Advanced Practice Midwife

## 2022-12-30 ENCOUNTER — Encounter: Payer: Self-pay | Admitting: Cardiology

## 2022-12-30 ENCOUNTER — Ambulatory Visit (INDEPENDENT_AMBULATORY_CARE_PROVIDER_SITE_OTHER): Payer: MEDICAID | Admitting: Advanced Practice Midwife

## 2022-12-30 VITALS — BP 118/72 | HR 104 | Ht 61.0 in | Wt 170.6 lb

## 2022-12-30 VITALS — BP 122/79 | HR 102 | Wt 171.0 lb

## 2022-12-30 DIAGNOSIS — R42 Dizziness and giddiness: Secondary | ICD-10-CM | POA: Diagnosis not present

## 2022-12-30 DIAGNOSIS — Z3480 Encounter for supervision of other normal pregnancy, unspecified trimester: Secondary | ICD-10-CM

## 2022-12-30 DIAGNOSIS — R55 Syncope and collapse: Secondary | ICD-10-CM

## 2022-12-30 DIAGNOSIS — Z3A35 35 weeks gestation of pregnancy: Secondary | ICD-10-CM

## 2022-12-30 NOTE — Progress Notes (Unsigned)
[facility]  New Evaluation  Date:  12/30/2022   ID:  Audrey Matthews, DOB [birth year], MRN [id number]  PCP:  Audrey Chimera, MD   [facility] Providers Cardiologist:  None  Electrophysiologist:  None   { Click to update primary MD,subspecialty MD or APP then REFRESH:1}    Referring MD: Audrey Gavel, MD   Chief Complaint: ***  History of Present Illness:    Audrey Matthews is a 22 y.o. female [G3P0111] who is being seen today for the evaluation of *** at the request of Audrey Gavel, MD.    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   On adderall prior to pregnancy, stopped w/ +PT, doing well   Autism spectrum disorder 12/25/2010   Borderline personality disorder (HCC) 06/03/2018   Chronic left-sided low back pain without sciatica 02/06/2022   Chronic migraine with aura 09/27/2019   Chronic post-traumatic stress disorder 12/25/2010   Depression with anxiety 10/03/2015   07/20/22 No meds, doing well, [facility] referral ordered   Episode of shaking 09/27/2019   Major depressive disorder 04/13/2017   PTSD (post-traumatic stress disorder)    Syncopal episodes     Past Surgical History:  Procedure Laterality Date   CAUTERIZE INNER NOSE     CERVICAL CERCLAGE N/A 04/14/2019   Procedure: CERCLAGE CERVICAL;  Surgeon: Audrey Space, MD;  Location: [facility];  Service: Gynecology;  Laterality: N/A;   DENTAL SURGERY     MOUTH SURGERY     WISDOM TOOTH EXTRACTION     { Click here to update PMH, PSH, OB Hx then refresh note  :1}   OB History     Gravida  3   Para  1   Term      Preterm  1   AB  1   Living  1      SAB  1   IAB      Ectopic      Multiple  0   Live Births  1            { Click here to update OB Charting then refresh note  :1}    Current Medications: Current Meds  Medication Sig   acetaminophen (TYLENOL) 500 MG tablet Take 500 mg by mouth every 6 (six) hours as needed for mild pain or headache.   albuterol (VENTOLIN HFA) 108 (90 Base) MCG/ACT inhaler Inhale 2 puffs into the lungs every 6 (six) hours as needed for wheezing or shortness of breath.   cyclobenzaprine (FLEXERIL) 10 MG tablet Take 1 tablet (10 mg total) by mouth 2 (two) times daily as needed for muscle spasms.   doxylamine, Sleep, (UNISOM) 25 MG tablet Take 25 mg by mouth at bedtime as needed.   famotidine (PEPCID) 20 MG tablet TAKE ONE TABLET TWICE DAILY   ferrous sulfate 325 (65 FE) MG EC tablet Take 1 tablet (325 mg total) by mouth every other day.   prenatal vitamin w/FE, FA (PRENATAL 1 + 1) 27-1 MG TABS tablet Take 1 tablet by mouth daily at 12 noon.   progesterone (PROMETRIUM) 200 MG capsule Place 1 capsule (200 mg total) vaginally at bedtime.     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   She has one daughter - born March 2021 (three month premature).   Right-handed.   No daily caffeine use.   Social Determinants of Health   Financial Resource Strain: Low Risk  (07/20/2022)   Overall Financial Resource Strain (CARDIA)     Difficulty of Paying Living Expenses: Not hard at all  Food Insecurity: No Food Insecurity (11/01/2022)   Hunger Vital Sign    Worried About Running Out of Food in the Last Year: Never true    Ran Out of Food in the Last Year: Never true  Transportation Needs: No Transportation Needs (11/01/2022)   PRAPARE - Administrator, Civil Service (Medical): No    Lack of Transportation (Non-Medical): No  Physical Activity: Insufficiently Active (07/20/2022)   Exercise Vital Sign    Days of Exercise per Week: 2 days    Minutes of Exercise per Session: 20 min  Stress: Stress Concern Present (07/20/2022)   [employer] of Occupational Health - Occupational Stress Questionnaire    Feeling of Stress : To some extent  Social Connections: Moderately Integrated (07/20/2022)   Social Connection and Isolation Panel [NHANES]    Frequency of Communication with Friends and Family: More than three times a week    Frequency of Social Gatherings with Friends and Family: Twice a week    Attends Religious Services: 1 to 4 times per year    Active Member of [employer] or Organizations: No    Attends Banker Meetings: Never    Marital Status: Married  { Click here to update SDOH then refresh :1}    Family History  Problem Relation Age of Onset   Depression Mother    Anxiety disorder Mother    ADD / ADHD Mother    Drug abuse Father    Depression Sister    Depression Maternal Grandmother    Jaundice Daughter    { Click here to update FH then refresh note    :1}   ROS:   Please see the history of present illness.    *** All other systems reviewed and are negative.   Labs/EKG Reviewed:    EKG:   EKG is *** ordered today.  The ekg ordered today demonstrates ***  Recent Labs: 12/20/2022: ALT 17; BUN 7; Creatinine, Ser 0.42; Hemoglobin 10.3; Magnesium 1.8; Platelets 314; Potassium 3.5; Sodium 133   Recent Lipid Panel Lab Results  Component Value Date/Time   CHOL 191 (H)  10/04/2015 06:29 AM   TRIG 132 10/04/2015 06:29 AM   HDL 44 10/04/2015 06:29 AM   CHOLHDL 4.3 10/04/2015 06:29 AM   LDLCALC 121 (H) 10/04/2015 06:29 AM    Physical Exam:    VS:  BP 118/72 (BP Location:  Right Arm, Patient Position: Sitting, Cuff Size: Normal)   Pulse (!) 104   Ht 5\' 1"  (1.549 m)   Wt 170 lb 9.6 oz (77.4 kg)   LMP 04/26/2022   SpO2 92%   BMI 32.23 kg/m     Wt Readings from Last 3 Encounters:  12/30/22 170 lb 9.6 oz (77.4 kg)  12/30/22 171 lb (77.6 kg)  12/27/22 170 lb 14.4 oz (77.5 kg)     GEN: *** Well nourished, well developed in no acute distress HEENT: Normal NECK: No JVD; No carotid bruits LYMPHATICS: No lymphadenopathy CARDIAC: ***RRR, no murmurs, rubs, gallops RESPIRATORY:  Clear to auscultation without rales, wheezing or rhonchi  ABDOMEN: Soft, non-tender, non-distended MUSCULOSKELETAL:  No edema; No deformity  SKIN: Warm and dry NEUROLOGIC:  Alert and oriented x 3 PSYCHIATRIC:  Normal affect    Risk Assessment/Risk Calculators:   { Click to calculate CARPREG II - THEN refresh note :1}    { Click to caclulate Mod WHO Class of CV Risk - THEN refresh note :1}     { Click for CHADS2VASc Score - THEN Refresh Note    :[id number]}      ASSESSMENT & PLAN:    *** There are no Patient Instructions on file for this visit.   Dispo:  No follow-ups on file.   Medication Adjustments/Labs and Tests Ordered: Current medicines are reviewed at length with the patient today.  Concerns regarding medicines are outlined above.  Tests Ordered: No orders of the defined types were placed in this encounter.  Medication Changes: No orders of the defined types were placed in this encounter.

## 2022-12-30 NOTE — Progress Notes (Unsigned)
Enrolled for [employer] to mail a ZIO XT long term holter monitor to the patients address on file.  

## 2022-12-30 NOTE — Patient Instructions (Signed)
Medication Instructions:  Your physician recommends that you continue on your current medications as directed. Please refer to the Current Medication list given to you today.  *If you need a refill on your cardiac medications before your next appointment, please call your pharmacy*  Lab Work: None  Testing/Procedures: Your physician has requested that you have an echocardiogram. Echocardiography is a painless test that uses sound waves to create images of your heart. It provides your doctor with information about the size and shape of your heart and how well your heart's chambers and valves are working. This procedure takes approximately one hour. There are no restrictions for this procedure. Please do NOT wear cologne, perfume, aftershave, or lotions (deodorant is allowed). Please arrive 15 minutes prior to your appointment time.   ZIO XT- Long Term Monitor Instructions  Your physician has requested you wear a ZIO patch monitor for 14 days.  This is a single patch monitor. [employer] supplies one patch monitor per enrollment. Additional stickers are not available. Please do not apply patch if you will be having a Nuclear Stress Test,  Echocardiogram, Cardiac CT, MRI, or Chest Xray during the period you would be wearing the  monitor. The patch cannot be worn during these tests. You cannot remove and re-apply the  ZIO XT patch monitor.  Your ZIO patch monitor will be mailed 3 day USPS to your address on file. It may take 3-5 days  to receive your monitor after you have been enrolled.  Once you have received your monitor, please review the enclosed instructions. Your monitor  has already been registered assigning a specific monitor serial # to you.  Billing and Patient Assistance Program Information  We have supplied [employer] with any of your insurance information on file for billing purposes. [employer] offers a sliding scale Patient Assistance Program for patients that do not have  insurance,  or whose insurance does not completely cover the cost of the ZIO monitor.  You must apply for the Patient Assistance Program to qualify for this discounted rate.  To apply, please call [employer] at [phone], select option 4, select option 2, ask to apply for  Patient Assistance Program. Meredeth Ide will ask your household income, and how many people  are in your household. They will quote your out-of-pocket cost based on that information.  [employer] will also be able to set up a 30-month, interest-free payment plan if needed.  Applying the monitor   Shave hair from upper left chest.  Hold abrader disc by orange tab. Rub abrader in 40 strokes over the upper left chest as  indicated in your monitor instructions.  Clean area with 4 enclosed alcohol pads. Let dry.  Apply patch as indicated in monitor instructions. Patch will be placed under collarbone on left  side of chest with arrow pointing upward.  Rub patch adhesive wings for 2 minutes. Remove white label marked "1". Remove the white  label marked "2". Rub patch adhesive wings for 2 additional minutes.  While looking in a mirror, press and release button in center of patch. A small green light will  flash 3-4 times. This will be your only indicator that the monitor has been turned on.  Do not shower for the first 24 hours. You may shower after the first 24 hours.  Press the button if you feel a symptom. You will hear a small click. Record Date, Time and  Symptom in the Patient Logbook.  When you are ready to remove the patch, follow instructions  on the last 2 pages of Patient  Logbook. Stick patch monitor onto the last page of Patient Logbook.  Place Patient Logbook in the blue and white box. Use locking tab on box and tape box closed  securely. The blue and white box has prepaid postage on it. Please place it in the mailbox as  soon as possible. Your physician should have your test results approximately 7 days after the  monitor has been  mailed back to [facility].  Call [facility] Customer Care at [phone] if you have questions regarding  your ZIO XT patch monitor. Call them immediately if you see an orange light blinking on your  monitor.  If your monitor falls off in less than 4 days, contact our Monitor department at [phone].  If your monitor becomes loose or falls off after 4 days call [employer] at [phone] for  suggestions on securing your monitor    Follow-Up: At [facility], you and your health needs are our priority.  As part of our continuing mission to provide you with exceptional heart care, we have created designated Provider Care Teams.  These Care Teams include your primary Cardiologist (physician) and Advanced Practice Providers (APPs -  Physician Assistants and Nurse Practitioners) who all work together to provide you with the care you need, when you need it.   Your next appointment:   8 week(s)  Provider:   Thomasene Ripple, DO

## 2022-12-30 NOTE — Progress Notes (Signed)
LOW-RISK PREGNANCY VISIT Patient name: Audrey Matthews MRN [id number]  Date of birth: 03-12-2001 Chief Complaint:   Routine Prenatal Visit ("My contractions are 5 minutes apart. I think I lost my mucus plug yesterday. I am having clear watery discharge and cloudy thick mucus like snot.")  History of Present Illness:   Audrey Matthews is a 22 y.o. [id number] female at [redacted]w[redacted]d with an Estimated Date of Delivery: 01/31/23 being seen today for ongoing management of a low-risk pregnancy.  Today she reports  having continued prodromal symptoms . Contractions: Regular (5 minutes apart). Vag. Bleeding: None.  Movement: Present. denies leaking of fluid. Review of Systems:   Pertinent items are noted in HPI Denies abnormal vaginal discharge w/ itching/odor/irritation, headaches, visual changes, shortness of breath, chest pain, abdominal pain, severe nausea/vomiting, or problems with urination or bowel movements unless otherwise stated above. Pertinent History Reviewed:  Reviewed past medical,surgical, social, obstetrical and family history.  Reviewed problem list, medications and allergies. Physical Assessment:   Vitals:   12/30/22 1446  BP: 122/79  Pulse: (!) 102  Weight: 171 lb (77.6 kg)  Body mass index is 32.31 kg/m.        Physical Examination:   General appearance: Well appearing, and in no distress  Mental status: Alert, oriented to person, place, and time  Skin: Warm & dry  Cardiovascular: Normal heart rate noted  Respiratory: Normal respiratory effort, no distress  Abdomen: Soft, gravid, nontender  Pelvic: Cervical exam performed  Dilation: 3.5 Effacement (%): 60 Station: -2  Extremities: Edema: None  Fetal Status: Fetal Heart Rate (bpm): 161 Fundal Height: 35 cm Movement: Present Presentation: Vertex  No results found for this or any previous visit (from the past 24 hour(s)).  Assessment & Plan:  1) Low-risk pregnancy [id number] at [redacted]w[redacted]d with an Estimated Date of Delivery: 01/31/23    2) Hx 28wk PTB, having prodromal labor now, but no cx change in about a month; tips given, including Tylenol PM for sleep prn   Meds: No orders of the defined types were placed in this encounter.  Labs/procedures today: SVE  Plan:  Continue routine obstetrical care   Reviewed: Preterm labor symptoms and general obstetric precautions including but not limited to vaginal bleeding, contractions, leaking of fluid and fetal movement were reviewed in detail with the patient.  All questions were answered. Has home bp cuff. Check bp weekly, let us know if >140/90.   Follow-up: Return for As scheduled. (GBS only at next visit)  No orders of the defined types were placed in this encounter.  Arabella Merles CNM 12/30/2022 3:07 PM

## 2022-12-31 NOTE — Addendum Note (Signed)
Addended by: Thomasene Ripple on: 12/31/2022 10:31 PM   Modules accepted: Level of Service

## 2023-01-02 ENCOUNTER — Inpatient Hospital Stay (HOSPITAL_COMMUNITY)
Admission: AD | Admit: 2023-01-02 | Discharge: 2023-01-05 | DRG: 796 | Disposition: A | Discharge status: Home / Self Care | Payer: MEDICAID | Attending: Obstetrics and Gynecology | Admitting: Obstetrics and Gynecology

## 2023-01-02 ENCOUNTER — Encounter: Payer: Self-pay | Admitting: Cardiology

## 2023-01-02 ENCOUNTER — Encounter (HOSPITAL_COMMUNITY): Payer: Self-pay | Admitting: Obstetrics and Gynecology

## 2023-01-02 DIAGNOSIS — O9952 Diseases of the respiratory system complicating childbirth: Secondary | ICD-10-CM | POA: Diagnosis present

## 2023-01-02 DIAGNOSIS — D62 Acute posthemorrhagic anemia: Secondary | ICD-10-CM | POA: Diagnosis not present

## 2023-01-02 DIAGNOSIS — O26899 Other specified pregnancy related conditions, unspecified trimester: Secondary | ICD-10-CM

## 2023-01-02 DIAGNOSIS — Z302 Encounter for sterilization: Secondary | ICD-10-CM

## 2023-01-02 DIAGNOSIS — J45909 Unspecified asthma, uncomplicated: Secondary | ICD-10-CM | POA: Diagnosis present

## 2023-01-02 DIAGNOSIS — O368130 Decreased fetal movements, third trimester, not applicable or unspecified: Secondary | ICD-10-CM | POA: Diagnosis present

## 2023-01-02 DIAGNOSIS — Z3480 Encounter for supervision of other normal pregnancy, unspecified trimester: Principal | ICD-10-CM

## 2023-01-02 DIAGNOSIS — O26893 Other specified pregnancy related conditions, third trimester: Secondary | ICD-10-CM | POA: Diagnosis present

## 2023-01-02 DIAGNOSIS — Z6791 Unspecified blood type, Rh negative: Secondary | ICD-10-CM | POA: Diagnosis not present

## 2023-01-02 DIAGNOSIS — Z3A36 36 weeks gestation of pregnancy: Secondary | ICD-10-CM

## 2023-01-02 DIAGNOSIS — O9081 Anemia of the puerperium: Secondary | ICD-10-CM | POA: Diagnosis not present

## 2023-01-02 DIAGNOSIS — Z813 Family history of other psychoactive substance abuse and dependence: Secondary | ICD-10-CM

## 2023-01-02 DIAGNOSIS — Z23 Encounter for immunization: Secondary | ICD-10-CM | POA: Diagnosis not present

## 2023-01-02 DIAGNOSIS — Z3A35 35 weeks gestation of pregnancy: Secondary | ICD-10-CM | POA: Diagnosis not present

## 2023-01-02 DIAGNOSIS — O42013 Preterm premature rupture of membranes, onset of labor within 24 hours of rupture, third trimester: Secondary | ICD-10-CM | POA: Diagnosis not present

## 2023-01-02 DIAGNOSIS — Z91040 Latex allergy status: Secondary | ICD-10-CM | POA: Diagnosis not present

## 2023-01-02 DIAGNOSIS — Z818 Family history of other mental and behavioral disorders: Secondary | ICD-10-CM

## 2023-01-02 LAB — URINALYSIS, ROUTINE W REFLEX MICROSCOPIC
Bilirubin Urine: NEGATIVE
Glucose, UA: NEGATIVE mg/dL
Hgb urine dipstick: NEGATIVE
Ketones, ur: NEGATIVE mg/dL
Nitrite: NEGATIVE
Protein, ur: NEGATIVE mg/dL
Specific Gravity, Urine: 1.009 (ref 1.005–1.030)
pH: 8 (ref 5.0–8.0)

## 2023-01-02 LAB — WET PREP, GENITAL
Clue Cells Wet Prep HPF POC: NONE SEEN
Sperm: NONE SEEN
Trich, Wet Prep: NONE SEEN
WBC, Wet Prep HPF POC: >=10 — AB (ref <10)
Yeast Wet Prep HPF POC: NONE SEEN

## 2023-01-02 LAB — POCT FERN TEST: POCT Fern Test: NEGATIVE

## 2023-01-02 LAB — GROUP B STREP BY PCR: Group B strep by PCR: NEGATIVE

## 2023-01-02 MED ORDER — ACETAMINOPHEN 500 MG PO TABS
1000.0000 mg | ORAL_TABLET | Freq: Once | ORAL | Status: AC
  Administered 2023-01-02: 1000 mg via ORAL
  Filled 2023-01-02: qty 2

## 2023-01-02 MED ORDER — CYCLOBENZAPRINE HCL 5 MG PO TABS
10.0000 mg | ORAL_TABLET | Freq: Once | ORAL | Status: AC
  Administered 2023-01-02: 10 mg via ORAL
  Filled 2023-01-02: qty 2

## 2023-01-02 NOTE — MAU Provider Note (Addendum)
History     CSN: [id number]  Arrival date and time: 01/02/23 1835   Event Date/Time   First Provider Initiated Contact with Patient 01/02/23 1927      Chief Complaint  Patient presents with   Hip Pain   Abdominal Pain   Audrey Matthews , a  22 y.o. [id number] at [redacted]w[redacted]d presents to MAU with complaints of pelvic pain. Patient reports that about 4 hours ago, she began having "sharp burning, pubic and low spinal pain," "lower period like tearing, and vaginal burning" and "sharp hip pain on both sides that radiates to her back." Currently rates pain 7/10. Attempted to relieve symptoms with PO tylenol at 3pm with minimal relief. She also reports "white cloudy snotty" vaginal discharged that has been on-going for 2-3 weeks without improvement. She also endorses that she has been leaking fluid for 3 weeks. She reports a "pin-hole leak that has continued to leak enough to wear a pad." Denies fever or an odor. Reports decreased fetal movement "but unsure because she has an anterior placenta." Only reports 4 movements today. No other complaints.          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   On adderall prior to pregnancy, stopped w/ +PT, doing well   Autism spectrum disorder 12/25/2010   Borderline personality disorder (HCC) 06/03/2018   Chronic left-sided low back pain without sciatica 02/06/2022   Chronic migraine with aura 09/27/2019   Chronic post-traumatic stress disorder 12/25/2010   Depression with anxiety 10/03/2015   07/20/22 No meds, doing well, [facility] referral ordered   Episode of shaking 09/27/2019   Major depressive  disorder 04/13/2017   PTSD (post-traumatic stress disorder)    Syncopal episodes     Past Surgical History:  Procedure Laterality Date   CAUTERIZE INNER NOSE     CERVICAL CERCLAGE N/A 04/14/2019   Procedure: CERCLAGE CERVICAL;  Surgeon: Noralee Space, MD;  Location: [facility];  Service: Gynecology;  Laterality: N/A;   DENTAL SURGERY     MOUTH SURGERY     WISDOM TOOTH EXTRACTION      Family History  Problem Relation Age of Onset   Depression Mother    Anxiety disorder Mother    ADD / ADHD Mother    Drug abuse Father    Depression Sister    Depression Maternal Grandmother    Jaundice Daughter     Social History   Tobacco Use   Smoking status: Never   Smokeless tobacco: Never  Vaping Use   Vaping status: Never Used  Substance Use Topics   Alcohol use: Not Currently    Comment: occ   Drug use: No    Allergies:  Allergies  Allergen Reactions   Latex Itching and Rash    Medications Prior to Admission  Medication Sig Dispense Refill Last Dose   acetaminophen (TYLENOL) 500 MG tablet Take 500  mg by mouth every 6 (six) hours as needed for mild pain or headache.   01/02/2023 at 1530   cyclobenzaprine (FLEXERIL) 10 MG tablet Take 1 tablet (10 mg total) by mouth 2 (two) times daily as needed for muscle spasms. 20 tablet 0 Past Week   famotidine (PEPCID) 20 MG tablet TAKE ONE TABLET TWICE DAILY 60 tablet 1 01/02/2023   ferrous sulfate 325 (65 FE) MG EC tablet Take 1 tablet (325 mg total) by mouth every other day. 60 tablet 2 01/01/2023   prenatal vitamin w/FE, FA (PRENATAL 1 + 1) 27-1 MG TABS tablet Take 1 tablet by mouth daily at 12 noon. 30 tablet 12 01/02/2023   progesterone (PROMETRIUM) 200 MG capsule Place 1 capsule (200 mg total) vaginally at bedtime. 30 capsule 5 01/01/2023   albuterol (VENTOLIN HFA) 108 (90 Base) MCG/ACT inhaler Inhale 2 puffs into the lungs every 6 (six) hours as needed for wheezing or shortness of breath.      doxylamine, Sleep, (UNISOM) 25 MG tablet  Take 25 mg by mouth at bedtime as needed.       Review of Systems  Constitutional:  Negative for chills, fatigue and fever.  Eyes:  Negative for pain and visual disturbance.  Respiratory:  Negative for apnea, shortness of breath and wheezing.   Cardiovascular:  Negative for chest pain and palpitations.  Gastrointestinal:  Positive for abdominal pain. Negative for constipation, diarrhea, nausea and vomiting.  Genitourinary:  Positive for pelvic pain, vaginal discharge and vaginal pain. Negative for difficulty urinating, dysuria and vaginal bleeding.  Musculoskeletal:  Positive for back pain.  Neurological:  Negative for seizures, weakness and headaches.  Psychiatric/Behavioral:  Negative for suicidal ideas.    Physical Exam   Blood pressure 119/83, pulse (!) 106, temperature 97.9 F (36.6 C), temperature source Oral, resp. rate 14, weight 78.5 kg, last menstrual period 04/26/2022, SpO2 100%.  Physical Exam Vitals and nursing note reviewed. Exam conducted with a chaperone present.  Constitutional:      General: She is not in acute distress.    Appearance: Normal appearance.  HENT:     Head: Normocephalic.  Pulmonary:     Effort: Pulmonary effort is normal.  Abdominal:     Palpations: Abdomen is soft.     Tenderness: There is no abdominal tenderness. There is no guarding.  Genitourinary:    Comments: Dilation: 5.5 Effacement (%): 90 Presentation: Vertex Exam by:: Dorathy Daft, CNM  Negative for pooling. White vaginal discharge noted within the vaginal vault. Attempted a SSP but unable to see underside of cervix.  Musculoskeletal:     Cervical back: Normal range of motion.  Skin:    General: Skin is warm and dry.  Neurological:     Mental Status: She is alert and oriented to person, place, and time.  Psychiatric:        Mood and Affect: Mood normal.   FHT: 125 bpm with moderate variability. Accels present, no decels  Toco: ctx x2 since arrival to MAU  MAU Course   Procedures Orders Placed This Encounter  Procedures   Wet prep, genital   Urinalysis, Routine w reflex microscopic -Urine, Clean Catch   Fern Test   Meds ordered this encounter  Medications   cyclobenzaprine (FLEXERIL) tablet 10 mg   acetaminophen (TYLENOL) tablet 1,000 mg   - Patient provided a fetal kick count clicker.  Results for orders placed or performed during the hospital encounter of 01/02/23 (from the past 24 hour(s))  Urinalysis, Routine w  reflex microscopic -Urine, Clean Catch     Status: Abnormal   Collection Time: 01/02/23  7:19 PM  Result Value Ref Range   Color, Urine YELLOW YELLOW   APPearance CLEAR CLEAR   Specific Gravity, Urine 1.009 1.005 - 1.030   pH 8.0 5.0 - 8.0   Glucose, UA NEGATIVE NEGATIVE mg/dL   Hgb urine dipstick NEGATIVE NEGATIVE   Bilirubin Urine NEGATIVE NEGATIVE   Ketones, ur NEGATIVE NEGATIVE mg/dL   Protein, ur NEGATIVE NEGATIVE mg/dL   Nitrite NEGATIVE NEGATIVE   Leukocytes,Ua MODERATE (A) NEGATIVE   RBC / HPF 0-5 0 - 5 RBC/hpf   WBC, UA 0-5 0 - 5 WBC/hpf   Bacteria, UA RARE (A) NONE SEEN   Squamous Epithelial / HPF 0-5 0 - 5 /HPF   Mucus PRESENT    Amorphous Crystal PRESENT   Wet prep, genital     Status: Abnormal   Collection Time: 01/02/23  7:52 PM  Result Value Ref Range   Yeast Wet Prep HPF POC NONE SEEN NONE SEEN   Trich, Wet Prep NONE SEEN NONE SEEN   Clue Cells Wet Prep HPF POC NONE SEEN NONE SEEN   WBC, Wet Prep HPF POC >=10 (A) <10   Sperm NONE SEEN   Fern Test     Status: None   Collection Time: 01/02/23  8:08 PM  Result Value Ref Range   POCT Fern Test Negative = intact amniotic membranes      MDM - Cervical change from 3-5cm since 10/6. Only 2 contractions noted on Monitor. Patient previously 3 and 90% for weeks.  - Possible admission? Given 35 weeks pregnancy possible overnight obs?  - Tylenol and flexeril ordered.  - Plan for recheck in 1 hour.  - Transfer of care to [place] Sous CNM 01/02/23 @ 2018.    Shantonette Danella Deis) Suzie Portela, MSN, CNM     Assessment and Plan   01/02/2023, 7:27 PM    Dilation: 6 Effacement (%): 90 Presentation: Vertex Exam by:: Gerrit Heck, CNM  Reassessment (10:05 PM) -Patient with minimal cervical change during contraction. However cervix can be stretched to 7cm when not contracting. Bulging bag of fluid noted with contraction as well. -Patient sleeping prior to provider at bedside and reports minimal perception of contractions, which occur every . -Patient does not desire epidural. -Discussed findings with patient who is agreeable with continued monitoring.  Reassessment (11:22 PM) -Nurse reporting patient with increased discomfort and perception of contractions. -Provider to bedside and patient confirms. -Discussed admission with expectant management. Cautioned that if no progression we would discharge home. -Patient agreeable with plan.  -L&D Team notified.   Cherre Robins MSN, CNM Advanced Practice Provider, [facility]

## 2023-01-02 NOTE — H&P (Shared)
OBSTETRIC ADMISSION HISTORY AND PHYSICAL  Audrey Matthews is a 22 y.o. female [phone] with IUP at [redacted]w[redacted]d by LMP presenting for PTL. She reports +FMs, No LOF, no VB, no blurry vision, headaches or peripheral edema, and RUQ pain.  She plans on breast feeding. She request BTL (8/14 papers signed) for birth control. She received her prenatal care at [facility]   Dating: By LMP --->  Estimated Date of Delivery: 01/31/23  Sono:    @[redacted]w[redacted]d , CWD, normal anatomy, cephalic presentation, anterior placental lie with marginal cord insertion, 355g, 54% EFW  Prenatal History/Complications:  Patient Active Problem List   Diagnosis Date Noted   Indication for care or intervention in labor or delivery 01/03/2023   Request for sterilization 11/04/2022   Threatened preterm labor, antepartum 11/01/2022   Rh negative state in antepartum period 07/13/2022   Rubella non-immune status, antepartum 07/06/2022   Encounter for supervision of normal pregnancy, antepartum 07/02/2022   Asthma 08/23/2019   History of preterm delivery 07/06/2019   H/O cervical incompetence 07/06/2019  Hx pPROM @ 28w NURSING  PROVIDER  [facility] Dating by LMP c/w U/S at 7 wks  [facility] Model Traditional Anatomy U/S Nl girl "Hailey"  Initiated care at  [employer]  English               LAB RESULTS   Support Person   Genetics NIPS: LR female            AFP:                  NT/IT (FT only) Neg/neg      Carrier Screen Horizon:negative  Rhogam  O/Negative/-- (04/12 1021) 9/3 A1C/GTT Early:             Third trimester: 73/121/67  Flu Vaccine        TDaP Vaccine  12/02/22  Blood Type O/Negative/-- (04/12 1021)  Covid Vaccine   Antibody Positive, See Final Results (04/12 1021)      Rubella 0.92 (04/12 1021)  Feeding Plan breast RPR Non Reactive (04/12 1021)  Contraception salpingectomy HBsAg Negative (04/12 1021)  Circumcision N/a HIV Non Reactive (04/12 1021)  Pediatrician  [facility] HCVAb Non Reactive  (04/12 1021)  Prenatal Classes discussed          Pap       Diagnosis  Date Value Ref Range Status  11/05/2020     Final    - Negative for intraepithelial lesion or malignancy (NILM)    BTLConsent 11/04/2022  GC/CT Initial:  -/-           35wks: -/-  VBAC  Consent   GBS For PCN allergy, check sensitivities            DME Rx [x]  BP cuff [ ]  Weight Scale Waterbirth  [ ]  Class [ ]  Consent [ ]  CNM visit  PHQ9 & GAD7 [ x] new OB [  ] 28 weeks  [  ] 36 weeks Induction  [ ]  Orders Entered [ ] Foley Y/N    Past Medical History: 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   On adderall prior to pregnancy, stopped w/ +PT, doing well   Autism spectrum disorder 12/25/2010  Borderline personality disorder (HCC) 06/03/2018   Chronic left-sided low back pain without sciatica 02/06/2022   Chronic migraine with aura 09/27/2019   Chronic post-traumatic stress disorder 12/25/2010   Depression with anxiety 10/03/2015   07/20/22 No meds, doing well, IBH referral ordered   Episode of shaking 09/27/2019   Major depressive disorder 04/13/2017   PTSD (post-traumatic stress disorder)    Syncopal episodes     Past Surgical History: Past Surgical History:  Procedure Laterality Date   CAUTERIZE INNER NOSE     CERVICAL CERCLAGE N/A 04/14/2019   Procedure: CERCLAGE CERVICAL;  Surgeon: Noralee Space, MD;  Location: [facility] LD [facility]S;  Service: Gynecology;  Laterality: N/A;   DENTAL SURGERY     MOUTH SURGERY     WISDOM TOOTH EXTRACTION      Obstetrical History: OB History     Gravida  3   Para  1   Term      Preterm  1   AB  1   Living  1      SAB  1   IAB      Ectopic      Multiple  0   Live Births  1           Social History Social History   Socioeconomic History   Marital status: Married     Spouse name: Not on file   Number of children: 1   Years of education: Not on file   Highest education level: Not on file  Occupational History   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   She has one daughter - born March 2021 (three month premature).   Right-handed.   No daily caffeine use.   Social Determinants of Health   Financial Resource Strain: Low Risk  (07/20/2022)   Overall Financial Resource Strain (CARDIA)    Difficulty of Paying Living Expenses: Not hard at all  Food Insecurity: No Food Insecurity (11/01/2022)   Hunger Vital Sign    Worried About Running Out of Food in the Last Year: Never true    Ran Out of Food in the Last Year: Never true  Transportation Needs: No Transportation Needs (11/01/2022)   PRAPARE - Administrator, Civil Service (Medical): No    Lack of Transportation (Non-Medical): No  Physical Activity: Insufficiently Active (07/20/2022)   Exercise Vital Sign    Days of Exercise per Week: 2 days    Minutes of Exercise per Session: 20 min  Stress: Stress Concern Present (07/20/2022)   [employer] of Occupational Health - Occupational Stress Questionnaire    Feeling of Stress : To some extent  Social Connections: Moderately Integrated (07/20/2022)   Social Connection and Isolation Panel [NHANES]    Frequency of Communication with Friends and Family: More than three times a week    Frequency of Social Gatherings with Friends and Family: Twice a week    Attends Religious Services: 1 to 4 times per year    Active Member of [employer]  or  Organizations: No    Attends Banker Meetings: Never    Marital Status: Married    Family History: Family History  Problem Relation Age of Onset   Depression Mother    Anxiety disorder Mother    ADD / ADHD Mother    Drug abuse Father    Depression Sister    Depression Maternal Grandmother    Jaundice Daughter     Allergies: Allergies  Allergen Reactions   Latex Itching and Rash    Medications Prior to Admission  Medication Sig Dispense Refill Last Dose   acetaminophen (TYLENOL) 500 MG tablet Take 500 mg by mouth every 6 (six) hours as needed for mild pain or headache.   01/02/2023 at 1530   cyclobenzaprine (FLEXERIL) 10 MG tablet Take 1 tablet (10 mg total) by mouth 2 (two) times daily as needed for muscle spasms. 20 tablet 0 Past Week   famotidine (PEPCID) 20 MG tablet TAKE ONE TABLET TWICE DAILY 60 tablet 1 01/02/2023   ferrous sulfate 325 (65 FE) MG EC tablet Take 1 tablet (325 mg total) by mouth every other day. 60 tablet 2 01/01/2023   prenatal vitamin w/FE, FA (PRENATAL 1 + 1) 27-1 MG TABS tablet Take 1 tablet by mouth daily at 12 noon. 30 tablet 12 01/02/2023   progesterone (PROMETRIUM) 200 MG capsule Place 1 capsule (200 mg total) vaginally at bedtime. 30 capsule 5 01/01/2023   albuterol (VENTOLIN HFA) 108 (90 Base) MCG/ACT inhaler Inhale 2 puffs into the lungs every 6 (six) hours as needed for wheezing or shortness of breath.      doxylamine, Sleep, (UNISOM) 25 MG tablet Take 25 mg by mouth at bedtime as needed.        Review of Systems   All systems reviewed and negative except as stated in HPI  Blood pressure 125/71, pulse 99, temperature 98.8 F (37.1 C), temperature source Oral, resp. rate 14, weight 78.5 kg, last menstrual period 04/26/2022, SpO2 98%. General appearance: alert, cooperative, and appears stated age Lungs: clear to auscultation bilaterally Heart: regular rate and rhythm Abdomen: soft, non-tender; bowel sounds normal Pelvic: normal  female genitalia  Extremities: Homans sign is negative, no sign of DVT Presentation: cephalic  Fetal monitoringBaseline: 130 bpm, Variability: Good {> 6 bpm), Accelerations: Reactive, and Decelerations: Absent Uterine activity q3-60min  Dilation: 6 Effacement (%): 90 Exam by:: Gerrit Heck, CNM  Prenatal labs: ABO, Rh: --/--/O NEG (08/11 2108) Antibody: Negative (08/30 0830) Rubella: 0.92 (04/12 1021) RPR: Non Reactive (08/30 0830)  HBsAg: Negative (04/12 1021)  HIV: Non Reactive (08/30 0830)  GBS: PRESUMPTIVE NEGATIVE/-- (10/12 2214)  1 hr Glucola: 3rd trimester normal Genetic screening: NIPS LR, Horizon negative Anatomy US normal  NURSING  PROVIDER  [facility] Location [facility] Dating by LMP c/w U/S at 7 wks  [facility] Model Traditional Anatomy U/S Nl girl "Hailey"  Initiated care at  [employer]  English               LAB RESULTS   Support Person   Genetics NIPS: LR female            AFP:                  NT/IT (FT only) Neg/neg      Carrier Screen Horizon:negative  Rhogam  O/Negative/-- (04/12 1021) 9/3 A1C/GTT Early:  Third trimester: 73/121/67  Flu Vaccine        TDaP Vaccine  12/02/22  Blood Type O/Negative/-- (04/12 1021)  Covid Vaccine   Antibody Positive, See Final Results (04/12 1021)      Rubella 0.92 (04/12 1021)  Feeding Plan breast RPR Non Reactive (04/12 1021)  Contraception salpingectomy HBsAg Negative (04/12 1021)  Circumcision N/a HIV Non Reactive (04/12 1021)  Pediatrician  [facility] HCVAb Non Reactive (04/12 1021)  Prenatal Classes discussed          Pap       Diagnosis  Date Value Ref Range Status  11/05/2020     Final    - Negative for intraepithelial lesion or malignancy (NILM)    BTLConsent 11/04/2022  GC/CT Initial:  -/-           35wks: -/-  VBAC  Consent   GBS For PCN allergy, check sensitivities            DME Rx [x]  BP cuff [ ]  Weight Scale Waterbirth  [ ]  Class [ ]  Consent [ ]  CNM visit  PHQ9 & GAD7 [ x] new OB [   ] 28 weeks  [  ] 36 weeks Induction  [ ]  Orders Entered [ ] Foley Y/N    Prenatal Transfer Tool  Maternal Diabetes: No Genetic Screening: Normal Maternal Ultrasounds/Referrals: Other: Fetal Ultrasounds or other Referrals:  Referred to Materal Fetal Medicine  Maternal Substance Abuse:  No Significant Maternal Medications:  Meds include: Other:  albuterol Significant Maternal Lab Results:  Rh negative, antibody positive and on Rhogam Number of Prenatal Visits:greater than 3 verified prenatal visits Other Comments:  None  Results for orders placed or performed during the hospital encounter of 01/02/23 (from the past 24 hour(s))  Urinalysis, Routine w reflex microscopic -Urine, Clean Catch   Collection Time: 01/02/23  7:19 PM  Result Value Ref Range   Color, Urine YELLOW YELLOW   APPearance CLEAR CLEAR   Specific Gravity, Urine 1.009 1.005 - 1.030   pH 8.0 5.0 - 8.0   Glucose, UA NEGATIVE NEGATIVE mg/dL   Hgb urine dipstick NEGATIVE NEGATIVE   Bilirubin Urine NEGATIVE NEGATIVE   Ketones, ur NEGATIVE NEGATIVE mg/dL   Protein, ur NEGATIVE NEGATIVE mg/dL   Nitrite NEGATIVE NEGATIVE   Leukocytes,Ua MODERATE (A) NEGATIVE   RBC / HPF 0-5 0 - 5 RBC/hpf   WBC, UA 0-5 0 - 5 WBC/hpf   Bacteria, UA RARE (A) NONE SEEN   Squamous Epithelial / HPF 0-5 0 - 5 /HPF   Mucus PRESENT    Amorphous Crystal PRESENT   Wet prep, genital   Collection Time: 01/02/23  7:52 PM  Result Value Ref Range   Yeast Wet Prep HPF POC NONE SEEN NONE SEEN   Trich, Wet Prep NONE SEEN NONE SEEN   Clue Cells Wet Prep HPF POC NONE SEEN NONE SEEN   WBC, Wet Prep HPF POC >=10 (A) <10   Sperm NONE SEEN   Fern Test   Collection Time: 01/02/23  8:08 PM  Result Value Ref Range   POCT Fern Test Negative = intact amniotic membranes   Group B strep by PCR   Collection Time: 01/02/23 10:14 PM   Specimen: Vaginal/Rectal; Genital  Result Value Ref Range   Group B strep by PCR PRESUMPTIVE NEGATIVE PRESUMPTIVE NEGATIVE     Patient Active Problem List   Diagnosis Date Noted   Request for sterilization 11/04/2022   Threatened preterm labor, antepartum 11/01/2022   Rh  negative state in antepartum period 07/13/2022   Rubella non-immune status, antepartum 07/06/2022   Encounter for supervision of normal pregnancy, antepartum 07/02/2022   Asthma 08/23/2019   History of preterm delivery 07/06/2019   H/O cervical incompetence 07/06/2019    Assessment/Plan:  Adela Glauser is a 22 y.o. [id number] at [redacted]w[redacted]d here for PTL.  #Labor: Expectant management given preterm GA. #Pain: Per patient request #FWB: Cat I #ID:  GBS presumptive neg, ampicillin PPx in absence of confirmation #MOF: Breast #MOC: BTL #Circ:  N/A  #Rh neg: pending PP labs  #RNI: PP MMR  #Asthma:  - home albuterol inhaler PRN - caution with Hemabate  #Hx of Preterm Delivery @28weeks , living   Dimitry Shitarev [facility] PGY-1 01/03/2023 2:24 AM  Evaluation and management procedures were performed by the [facility] Medicine Resident under my supervision. I was immediately available for direct supervision, assistance and direction throughout this encounter.  I also confirm that I have verified the information documented in the resident's note, and that I have also personally reperformed the pertinent components of the physical exam and all of the medical decision making activities.  I have also made any necessary editorial changes.   Mittie Bodo, MD Family Medicine - Obstetrics Fellow

## 2023-01-02 NOTE — MAU Note (Signed)
..  Audrey Matthews is a 22 y.o. at [redacted]w[redacted]d here in MAU reporting: around 1530 she started having pain in her pelvis like her hips are spreading apart. She also has a pulling sensation in her lower abdomen and burning pain in her vagina. Reports a "snotty/white/cloudy" discharge for about a month. Denies VB. Has an anterior placenta but has felt her baby move x3 today. Feels less than normal.   Pain score: 7 Vitals:   01/02/23 1850  BP: 108/68  Pulse: (!) 103  Resp: 14  Temp: 97.9 F (36.6 C)  SpO2: 100%     FHT:146 Lab orders placed from triage:   UA

## 2023-01-03 ENCOUNTER — Encounter (HOSPITAL_COMMUNITY): Payer: Self-pay | Admitting: Family Medicine

## 2023-01-03 ENCOUNTER — Other Ambulatory Visit: Payer: Self-pay

## 2023-01-03 DIAGNOSIS — Z3A35 35 weeks gestation of pregnancy: Secondary | ICD-10-CM

## 2023-01-03 DIAGNOSIS — O42013 Preterm premature rupture of membranes, onset of labor within 24 hours of rupture, third trimester: Secondary | ICD-10-CM

## 2023-01-03 LAB — TYPE AND SCREEN
ABO/RH(D): O NEG
Antibody Screen: POSITIVE

## 2023-01-03 LAB — CBC
HCT: 33.8 % — ABNORMAL LOW (ref 36.0–46.0)
Hemoglobin: 10.7 g/dL — ABNORMAL LOW (ref 12.0–15.0)
MCH: 25 pg — ABNORMAL LOW (ref 26.0–34.0)
MCHC: 31.7 g/dL (ref 30.0–36.0)
MCV: 79 fL — ABNORMAL LOW (ref 80.0–100.0)
Platelets: 271 10*3/uL (ref 150–400)
RBC: 4.28 MIL/uL (ref 3.87–5.11)
RDW: 14.7 % (ref 11.5–15.5)
WBC: 10.8 10*3/uL — ABNORMAL HIGH (ref 4.0–10.5)
nRBC: 0 % (ref 0.0–0.2)

## 2023-01-03 LAB — RPR: RPR Ser Ql: NONREACTIVE

## 2023-01-03 MED ORDER — ALBUTEROL SULFATE HFA 108 (90 BASE) MCG/ACT IN AERS: 2.0000 | INHALATION_SPRAY | Freq: Four times a day (QID) | RESPIRATORY_TRACT | Status: DC | PRN

## 2023-01-03 MED ORDER — OXYTOCIN-SODIUM CHLORIDE 30-0.9 UT/500ML-% IV SOLN
2.5000 [IU]/h | INTRAVENOUS | Status: DC
  Administered 2023-01-03: 2.5 [IU]/h via INTRAVENOUS
  Filled 2023-01-03: qty 500

## 2023-01-03 MED ORDER — FENTANYL CITRATE (PF) 100 MCG/2ML IJ SOLN
50.0000 ug | INTRAMUSCULAR | Status: DC | PRN
  Administered 2023-01-03: 100 ug via INTRAVENOUS
  Filled 2023-01-03: qty 2

## 2023-01-03 MED ORDER — CALCIUM CARBONATE ANTACID 500 MG PO CHEW
1.0000 | CHEWABLE_TABLET | ORAL | Status: DC | PRN
  Filled 2023-01-03: qty 1

## 2023-01-03 MED ORDER — EPHEDRINE 5 MG/ML INJ: 10.0000 mg | INTRAVENOUS | Status: DC | PRN

## 2023-01-03 MED ORDER — DIPHENHYDRAMINE HCL 25 MG PO CAPS: 25.0000 mg | ORAL_CAPSULE | Freq: Four times a day (QID) | ORAL | Status: DC | PRN

## 2023-01-03 MED ORDER — SODIUM CHLORIDE 0.9% FLUSH: 3.0000 mL | Freq: Two times a day (BID) | INTRAVENOUS | Status: DC

## 2023-01-03 MED ORDER — COCONUT OIL OIL: 1.0000 | TOPICAL_OIL | Status: DC | PRN

## 2023-01-03 MED ORDER — ONDANSETRON HCL 4 MG/2ML IJ SOLN: 4.0000 mg | INTRAMUSCULAR | Status: DC | PRN

## 2023-01-03 MED ORDER — ACETAMINOPHEN 325 MG PO TABS: 650.0000 mg | ORAL_TABLET | ORAL | Status: DC | PRN

## 2023-01-03 MED ORDER — ALBUTEROL SULFATE (2.5 MG/3ML) 0.083% IN NEBU: 2.5000 mg | INHALATION_SOLUTION | Freq: Four times a day (QID) | RESPIRATORY_TRACT | Status: DC | PRN

## 2023-01-03 MED ORDER — OXYTOCIN BOLUS FROM INFUSION
333.0000 mL | Freq: Once | INTRAVENOUS | Status: AC
  Administered 2023-01-03: 333 mL via INTRAVENOUS

## 2023-01-03 MED ORDER — LACTATED RINGERS IV SOLN: 500.0000 mL | Freq: Once | INTRAVENOUS | Status: DC

## 2023-01-03 MED ORDER — ZOLPIDEM TARTRATE 5 MG PO TABS: 5.0000 mg | ORAL_TABLET | Freq: Every evening | ORAL | Status: DC | PRN

## 2023-01-03 MED ORDER — TETANUS-DIPHTH-ACELL PERTUSSIS 5-2.5-18.5 LF-MCG/0.5 IM SUSY: 0.5000 mL | PREFILLED_SYRINGE | Freq: Once | INTRAMUSCULAR | Status: DC

## 2023-01-03 MED ORDER — PHENYLEPHRINE 80 MCG/ML (10ML) SYRINGE FOR IV PUSH (FOR BLOOD PRESSURE SUPPORT): 80.0000 ug | PREFILLED_SYRINGE | INTRAVENOUS | Status: DC | PRN

## 2023-01-03 MED ORDER — ONDANSETRON HCL 4 MG PO TABS: 4.0000 mg | ORAL_TABLET | ORAL | Status: DC | PRN

## 2023-01-03 MED ORDER — ONDANSETRON HCL 4 MG/2ML IJ SOLN: 4.0000 mg | Freq: Four times a day (QID) | INTRAMUSCULAR | Status: DC | PRN

## 2023-01-03 MED ORDER — SENNOSIDES-DOCUSATE SODIUM 8.6-50 MG PO TABS
2.0000 | ORAL_TABLET | ORAL | Status: DC
  Filled 2023-01-03: qty 2

## 2023-01-03 MED ORDER — LACTATED RINGERS IV SOLN: 500.0000 mL | INTRAVENOUS | Status: DC | PRN

## 2023-01-03 MED ORDER — SIMETHICONE 80 MG PO CHEW: 80.0000 mg | CHEWABLE_TABLET | ORAL | Status: DC | PRN

## 2023-01-03 MED ORDER — WITCH HAZEL-GLYCERIN EX PADS: 1.0000 | MEDICATED_PAD | CUTANEOUS | Status: DC | PRN

## 2023-01-03 MED ORDER — IBUPROFEN 600 MG PO TABS
600.0000 mg | ORAL_TABLET | Freq: Four times a day (QID) | ORAL | Status: DC
  Administered 2023-01-03 – 2023-01-05 (×6): 600 mg via ORAL
  Filled 2023-01-03 (×6): qty 1

## 2023-01-03 MED ORDER — FAMOTIDINE IN NACL 20-0.9 MG/50ML-% IV SOLN
20.0000 mg | Freq: Two times a day (BID) | INTRAVENOUS | Status: DC
  Administered 2023-01-03: 20 mg via INTRAVENOUS
  Filled 2023-01-03 (×2): qty 50

## 2023-01-03 MED ORDER — LIDOCAINE HCL (PF) 1 % IJ SOLN: 30.0000 mL | INTRAMUSCULAR | Status: DC | PRN

## 2023-01-03 MED ORDER — ACETAMINOPHEN 325 MG PO TABS
650.0000 mg | ORAL_TABLET | ORAL | Status: DC | PRN
  Administered 2023-01-04 – 2023-01-05 (×5): 650 mg via ORAL
  Filled 2023-01-03 (×5): qty 2

## 2023-01-03 MED ORDER — DIPHENHYDRAMINE HCL 50 MG/ML IJ SOLN: 12.5000 mg | INTRAMUSCULAR | Status: DC | PRN

## 2023-01-03 MED ORDER — PRENATAL MULTIVITAMIN CH
1.0000 | ORAL_TABLET | Freq: Every day | ORAL | Status: DC
  Administered 2023-01-05: 1 via ORAL
  Filled 2023-01-03: qty 1

## 2023-01-03 MED ORDER — RHO D IMMUNE GLOBULIN 1500 UNIT/2ML IJ SOSY
300.0000 ug | PREFILLED_SYRINGE | Freq: Once | INTRAMUSCULAR | Status: AC
  Administered 2023-01-04: 300 ug via INTRAVENOUS
  Filled 2023-01-03: qty 2

## 2023-01-03 MED ORDER — DIBUCAINE (PERIANAL) 1 % EX OINT: 1.0000 | TOPICAL_OINTMENT | CUTANEOUS | Status: DC | PRN

## 2023-01-03 MED ORDER — FENTANYL-BUPIVACAINE-NACL 0.5-0.125-0.9 MG/250ML-% EP SOLN: 12.0000 mL/h | EPIDURAL | Status: DC | PRN

## 2023-01-03 MED ORDER — SODIUM CHLORIDE 0.9 % IV SOLN
5.0000 10*6.[IU] | Freq: Once | INTRAVENOUS | Status: AC
  Administered 2023-01-03: 5 10*6.[IU] via INTRAVENOUS
  Filled 2023-01-03: qty 5

## 2023-01-03 MED ORDER — CALCIUM CARBONATE ANTACID 500 MG PO CHEW
2.0000 | CHEWABLE_TABLET | ORAL | Status: DC | PRN
  Administered 2023-01-03: 400 mg via ORAL
  Filled 2023-01-03: qty 2

## 2023-01-03 MED ORDER — MEASLES, MUMPS & RUBELLA VAC IJ SOLR
0.5000 mL | Freq: Once | INTRAMUSCULAR | Status: AC
  Administered 2023-01-05: 0.5 mL via SUBCUTANEOUS
  Filled 2023-01-03: qty 0.5

## 2023-01-03 MED ORDER — SODIUM CHLORIDE 0.9 % IV SOLN
2.0000 g | Freq: Once | INTRAVENOUS | Status: AC
  Administered 2023-01-03: 2 g via INTRAVENOUS
  Filled 2023-01-03: qty 2000

## 2023-01-03 MED ORDER — LACTATED RINGERS IV SOLN: INTRAVENOUS | Status: DC

## 2023-01-03 MED ORDER — BENZOCAINE-MENTHOL 20-0.5 % EX AERO
1.0000 | INHALATION_SPRAY | CUTANEOUS | Status: DC | PRN
  Filled 2023-01-03: qty 56

## 2023-01-03 MED ORDER — SOD CITRATE-CITRIC ACID 500-334 MG/5ML PO SOLN
30.0000 mL | ORAL | Status: DC | PRN
  Administered 2023-01-03: 30 mL via ORAL
  Filled 2023-01-03: qty 30

## 2023-01-03 MED ORDER — SODIUM CHLORIDE 0.9% FLUSH: 3.0000 mL | INTRAVENOUS | Status: DC | PRN

## 2023-01-03 MED ORDER — SODIUM CHLORIDE 0.9 % IV SOLN: 250.0000 mL | INTRAVENOUS | Status: DC | PRN

## 2023-01-03 MED ORDER — SODIUM CHLORIDE 0.9 % IV SOLN
1.0000 g | INTRAVENOUS | Status: AC
  Administered 2023-01-03: 1 g via INTRAVENOUS
  Filled 2023-01-03: qty 1000

## 2023-01-03 MED ORDER — PENICILLIN G POT IN DEXTROSE 60000 UNIT/ML IV SOLN: 3.0000 10*6.[IU] | INTRAVENOUS | Status: DC

## 2023-01-03 NOTE — Lactation Note (Signed)
This note was copied from a baby's chart. Lactation Consultation Note  Patient Name: Audrey Matthews Today's Date: 01/03/2023 Age:23 hours Reason for consult: Initial assessment;Late-preterm 34-36.6wks;Infant < 6lbs  P2- LC reviewed LPI/Low birth weight feeding guidelines. MOB verbalized understanding. LC also reviewed CDC milk storage guidelines and LC services handout. MOB denies having any questions or concerns at this time. LC encouraged MOB to call for one of infant's feedings so lactation team can assess a latch.  Maternal Data Does the patient have breastfeeding experience prior to this delivery?: Yes  Feeding Mother's Current Feeding Choice: Breast Milk  LATCH Score Latch: Grasps breast easily, tongue down, lips flanged, rhythmical sucking.  Audible Swallowing: Spontaneous and intermittent  Type of Nipple: Everted at rest and after stimulation  Comfort (Breast/Nipple): Soft / non-tender  Hold (Positioning): Assistance needed to correctly position infant at breast and maintain latch.  LATCH Score: 9   Lactation Tools Discussed/Used Pump Education: Milk Storage  Interventions Interventions: Breast feeding basics reviewed;Education;Pace feeding;LC Services brochure;LPT handout/interventions  Discharge Discharge Education: Warning signs for feeding baby  Consult Status Consult Status: Follow-up Date: 01/04/23 Follow-up type: In-patient    Linward Headland, IBCLC 01/03/2023, 7:25 PM

## 2023-01-03 NOTE — Discharge Summary (Signed)
Postpartum Discharge Summary  Date of Service updated***     Patient Name: Audrey Matthews DOB: 08/05/2000 MRN: [id number]  Date of admission: 01/02/2023 Delivery date:01/03/2023 Delivering provider:   Date of discharge: 01/03/2023  Admitting diagnosis: Indication for care or intervention in labor or delivery [O75.9] Intrauterine pregnancy: [redacted]w[redacted]d     Secondary diagnosis:  Principal Problem:   Indication for care or intervention in labor or delivery  Additional problems: PTL    Discharge diagnosis: Preterm Pregnancy Delivered                                              Post partum procedures:{Postpartum procedures:23558} Augmentation: AROM Complications: None  Hospital course: Onset of Labor With Vaginal Delivery      22 y.o. yo [id number] at [redacted]w[redacted]d was admitted in Active Labor on 01/02/2023. Labor course was complicated by PTL.    Membrane Rupture Time/Date: 2:57 PM,01/03/2023  Delivery Method:Vaginal, Spontaneous Operative Delivery:N/A Episiotomy: None Lacerations:    Patient had a postpartum course complicated by ***.  She is ambulating, tolerating a regular diet, passing flatus, and urinating well. Patient is discharged home in stable condition on 01/03/23.  Newborn Data: Birth date:01/03/2023 Birth time:4:33 PM Gender:Female Living status:Living Apgars:8 ,9  Weight:2430 g  Magnesium Sulfate received: {Mag received:30440022} BMZ received: Yes Rhophylac:Yes JYN:{WGN:56213086} T-DaP:Given prenatally Flu: {VHQ:46962} RSV Vaccine received: {RSV:31013} Transfusion:{Transfusion received:30440034}  Immunizations received: Immunization History  Administered Date(s) Administered   Hepatitis B, PED/ADOLESCENT 2001-01-18   MMR 06/03/2019   Pneumococcal Polysaccharide-23 06/03/2019   Rho (D) Immune Globulin 11/24/2022   Tdap 05/30/2019, 06/03/2019, 12/02/2022    Physical exam  Vitals:   01/03/23 1433 01/03/23 1502 01/03/23 1531 01/03/23 1601  BP: 115/67 126/78  129/86 (!) 104/54  Pulse: 87 90 76 89  Resp: 16 16 18 18   Temp:  98.1 F (36.7 C)    TempSrc:  Oral    SpO2:      Weight:      Height:       General: {Exam; general:21111117} Lochia: {Desc; appropriate/inappropriate:30686::"appropriate"} Uterine Fundus: {Desc; firm/soft:30687} Incision: {Exam; incision:21111123} DVT Evaluation: {Exam; dvt:2111122} Labs: Lab Results  Component Value Date   WBC 10.8 (H) 01/02/2023   HGB 10.7 (L) 01/02/2023   HCT 33.8 (L) 01/02/2023   MCV 79.0 (L) 01/02/2023   PLT 271 01/02/2023      Latest Ref Rng & Units 12/20/2022    8:19 PM  CMP  Glucose 70 - 99 mg/dL 75   BUN 6 - 20 mg/dL 7   Creatinine 9.52 - 8.41 mg/dL 3.24   Sodium 401 - 027 mmol/L 133   Potassium 3.5 - 5.1 mmol/L 3.5   Chloride 98 - 111 mmol/L 102   CO2 22 - 32 mmol/L 19   Calcium 8.9 - 10.3 mg/dL 9.2   Total Protein 6.5 - 8.1 g/dL 6.6   Total Bilirubin 0.3 - 1.2 mg/dL 0.2   Alkaline Phos 38 - 126 U/L 92   AST 15 - 41 U/L 20   ALT 0 - 44 U/L 17    Edinburgh Score:    07/06/2019    1:39 PM  Edinburgh Postnatal Depression Scale Screening Tool  I have been able to laugh and see the funny side of things. 1  I have looked forward with enjoyment to things. 2  I have blamed myself unnecessarily when things went wrong.  3  I have been anxious or worried for no good reason. 2  I have felt scared or panicky for no good reason. 0  Things have been getting on top of me. 2  I have been so unhappy that I have had difficulty sleeping. 3  I have felt sad or miserable. 2  I have been so unhappy that I have been crying. 2  The thought of harming myself has occurred to me. 2  Edinburgh Postnatal Depression Scale Total 19   No data recorded  After visit meds:  Allergies as of 01/03/2023       Reactions   Latex Itching, Rash     Med Rec must be completed prior to using this [facility]***        Discharge home in stable condition Infant Feeding: Breast Infant Disposition:home  with mother Discharge instruction: per After Visit Summary and Postpartum booklet. Activity: Advance as tolerated. Pelvic rest for 6 weeks.  Diet: routine diet Future Appointments: Future Appointments  Date Time Provider Department Center  01/06/2023  2:50 PM Cheral Marker, CNM [facility]  01/13/2023  2:50 PM Cheral Marker, CNM [facility]  01/20/2023  2:50 PM Cheral Marker, CNM [facility]  01/21/2023  1:45 PM [id number]  01/27/2023  2:50 PM Arabella Merles, CNM [facility]   Follow up Visit: Message sent to [facility] 10/13  Please schedule this patient for a In person postpartum visit in 6 weeks with the following provider: Any provider. Additional Postpartum F/U:Postpartum Depression checkup  High risk pregnancy complicated by:  hx of PTD Delivery mode:  Vaginal, Spontaneous Anticipated Birth Control:  BTL done [facility]   01/03/2023 Wyn Forster, MD

## 2023-01-04 ENCOUNTER — Encounter (HOSPITAL_COMMUNITY)
Admission: AD | Disposition: A | Discharge status: Home / Self Care | Payer: Self-pay | Source: Home / Self Care | Attending: Obstetrics and Gynecology

## 2023-01-04 ENCOUNTER — Inpatient Hospital Stay (HOSPITAL_COMMUNITY): Payer: MEDICAID | Admitting: Anesthesiology

## 2023-01-04 ENCOUNTER — Other Ambulatory Visit: Payer: Self-pay

## 2023-01-04 ENCOUNTER — Encounter (HOSPITAL_COMMUNITY): Payer: Self-pay | Admitting: Family Medicine

## 2023-01-04 DIAGNOSIS — Z302 Encounter for sterilization: Secondary | ICD-10-CM

## 2023-01-04 HISTORY — PX: TUBAL LIGATION: SHX77

## 2023-01-04 LAB — GC/CHLAMYDIA PROBE AMP (~~LOC~~) NOT AT ARMC
Chlamydia: NEGATIVE
Comment: NEGATIVE
Comment: NORMAL
Neisseria Gonorrhea: NEGATIVE

## 2023-01-04 LAB — CBC
HCT: 28.8 % — ABNORMAL LOW (ref 36.0–46.0)
Hemoglobin: 9 g/dL — ABNORMAL LOW (ref 12.0–15.0)
MCH: 24.5 pg — ABNORMAL LOW (ref 26.0–34.0)
MCHC: 31.3 g/dL (ref 30.0–36.0)
MCV: 78.3 fL — ABNORMAL LOW (ref 80.0–100.0)
Platelets: 256 10*3/uL (ref 150–400)
RBC: 3.68 MIL/uL — ABNORMAL LOW (ref 3.87–5.11)
RDW: 14.6 % (ref 11.5–15.5)
WBC: 12.9 10*3/uL — ABNORMAL HIGH (ref 4.0–10.5)
nRBC: 0 % (ref 0.0–0.2)

## 2023-01-04 SURGERY — LIGATION, FALLOPIAN TUBE, POSTPARTUM
Anesthesia: General | Site: Abdomen

## 2023-01-04 MED ORDER — FERROUS SULFATE 325 (65 FE) MG PO TABS
325.0000 mg | ORAL_TABLET | ORAL | Status: DC
  Administered 2023-01-04: 325 mg via ORAL
  Filled 2023-01-04: qty 1

## 2023-01-04 MED ORDER — DEXAMETHASONE SODIUM PHOSPHATE 10 MG/ML IJ SOLN
INTRAMUSCULAR | Status: DC | PRN
  Administered 2023-01-04: 4 mg via INTRAVENOUS

## 2023-01-04 MED ORDER — BUPIVACAINE HCL (PF) 0.25 % IJ SOLN
INTRAMUSCULAR | Status: DC | PRN
  Administered 2023-01-04: 30 mL

## 2023-01-04 MED ORDER — DEXMEDETOMIDINE HCL IN NACL 80 MCG/20ML IV SOLN
INTRAVENOUS | Status: DC | PRN
  Administered 2023-01-04 (×2): 4 ug via INTRAVENOUS

## 2023-01-04 MED ORDER — FAMOTIDINE 20 MG PO TABS
40.0000 mg | ORAL_TABLET | Freq: Once | ORAL | Status: AC
  Administered 2023-01-04: 40 mg via ORAL
  Filled 2023-01-04: qty 2

## 2023-01-04 MED ORDER — PROPOFOL 10 MG/ML IV BOLUS
INTRAVENOUS | Status: DC | PRN
  Administered 2023-01-04: 150 mg via INTRAVENOUS
  Administered 2023-01-04: 50 mg via INTRAVENOUS

## 2023-01-04 MED ORDER — FENTANYL CITRATE (PF) 250 MCG/5ML IJ SOLN
INTRAMUSCULAR | Status: AC
  Filled 2023-01-04: qty 5

## 2023-01-04 MED ORDER — MIDAZOLAM HCL 2 MG/2ML IJ SOLN
INTRAMUSCULAR | Status: DC | PRN
  Administered 2023-01-04: 1 mg via INTRAVENOUS

## 2023-01-04 MED ORDER — FENTANYL CITRATE (PF) 100 MCG/2ML IJ SOLN
INTRAMUSCULAR | Status: DC | PRN
  Administered 2023-01-04 (×4): 50 ug via INTRAVENOUS

## 2023-01-04 MED ORDER — KETOROLAC TROMETHAMINE 30 MG/ML IJ SOLN
INTRAMUSCULAR | Status: AC
  Filled 2023-01-04: qty 1

## 2023-01-04 MED ORDER — KETOROLAC TROMETHAMINE 30 MG/ML IJ SOLN
30.0000 mg | Freq: Once | INTRAMUSCULAR | Status: AC | PRN
  Administered 2023-01-04: 30 mg via INTRAVENOUS

## 2023-01-04 MED ORDER — DEXMEDETOMIDINE HCL IN NACL 80 MCG/20ML IV SOLN
INTRAVENOUS | Status: AC
  Filled 2023-01-04: qty 20

## 2023-01-04 MED ORDER — ARTIFICIAL TEARS OPHTHALMIC OINT
TOPICAL_OINTMENT | OPHTHALMIC | Status: AC
  Filled 2023-01-04: qty 3.5

## 2023-01-04 MED ORDER — ONDANSETRON HCL 4 MG/2ML IJ SOLN
INTRAMUSCULAR | Status: AC
  Filled 2023-01-04: qty 2

## 2023-01-04 MED ORDER — METOCLOPRAMIDE HCL 10 MG PO TABS
10.0000 mg | ORAL_TABLET | Freq: Once | ORAL | Status: AC
  Administered 2023-01-04: 10 mg via ORAL
  Filled 2023-01-04: qty 1

## 2023-01-04 MED ORDER — PROPOFOL 10 MG/ML IV BOLUS
INTRAVENOUS | Status: AC
  Filled 2023-01-04: qty 40

## 2023-01-04 MED ORDER — OXYCODONE HCL 5 MG PO TABS
5.0000 mg | ORAL_TABLET | ORAL | Status: DC | PRN
  Administered 2023-01-04: 5 mg via ORAL
  Administered 2023-01-05: 10 mg via ORAL
  Administered 2023-01-05 (×2): 5 mg via ORAL
  Filled 2023-01-04 (×2): qty 1
  Filled 2023-01-04: qty 2
  Filled 2023-01-04: qty 1

## 2023-01-04 MED ORDER — FENTANYL CITRATE (PF) 100 MCG/2ML IJ SOLN: 25.0000 ug | INTRAMUSCULAR | Status: DC | PRN

## 2023-01-04 MED ORDER — LIDOCAINE HCL (CARDIAC) PF 100 MG/5ML IV SOSY
PREFILLED_SYRINGE | INTRAVENOUS | Status: DC | PRN
  Administered 2023-01-04: 80 mg via INTRAVENOUS

## 2023-01-04 MED ORDER — DEXAMETHASONE SODIUM PHOSPHATE 10 MG/ML IJ SOLN
INTRAMUSCULAR | Status: AC
  Filled 2023-01-04: qty 1

## 2023-01-04 MED ORDER — SUCCINYLCHOLINE CHLORIDE 200 MG/10ML IV SOSY
PREFILLED_SYRINGE | INTRAVENOUS | Status: AC
  Filled 2023-01-04: qty 10

## 2023-01-04 MED ORDER — SUCCINYLCHOLINE CHLORIDE 200 MG/10ML IV SOSY
PREFILLED_SYRINGE | INTRAVENOUS | Status: DC | PRN
  Administered 2023-01-04: 100 mg via INTRAVENOUS

## 2023-01-04 MED ORDER — ROCURONIUM BROMIDE 10 MG/ML (PF) SYRINGE
PREFILLED_SYRINGE | INTRAVENOUS | Status: AC
  Filled 2023-01-04: qty 10

## 2023-01-04 MED ORDER — ONDANSETRON HCL 4 MG/2ML IJ SOLN
INTRAMUSCULAR | Status: DC | PRN
  Administered 2023-01-04: 4 mg via INTRAVENOUS

## 2023-01-04 MED ORDER — GLYCOPYRROLATE 0.2 MG/ML IJ SOLN
INTRAMUSCULAR | Status: DC | PRN
  Administered 2023-01-04: .1 mg via INTRAVENOUS

## 2023-01-04 MED ORDER — MIDAZOLAM HCL 2 MG/2ML IJ SOLN
INTRAMUSCULAR | Status: AC
  Filled 2023-01-04: qty 2

## 2023-01-04 MED ORDER — SODIUM CHLORIDE 0.9 % IR SOLN
Status: DC | PRN
  Administered 2023-01-04: 1000 mL

## 2023-01-04 MED ORDER — AMISULPRIDE (ANTIEMETIC) 5 MG/2ML IV SOLN: 10.0000 mg | Freq: Once | INTRAVENOUS | Status: DC | PRN

## 2023-01-04 MED ORDER — BUPIVACAINE HCL (PF) 0.25 % IJ SOLN
INTRAMUSCULAR | Status: AC
  Filled 2023-01-04: qty 30

## 2023-01-04 MED ORDER — LACTATED RINGERS IV SOLN: INTRAVENOUS | Status: DC

## 2023-01-04 SURGICAL SUPPLY — 22 items
ADH SKN CLS APL DERMABOND .7 (GAUZE/BANDAGES/DRESSINGS) ×1
BLADE SURG 11 STRL SS (BLADE) ×1 IMPLANT
CLIP FILSHIE TUBAL LIGA STRL (Clip) ×1 IMPLANT
DERMABOND ADVANCED .7 DNX12 (GAUZE/BANDAGES/DRESSINGS) IMPLANT
DRSG OPSITE POSTOP 3X4 (GAUZE/BANDAGES/DRESSINGS) ×1 IMPLANT
DURAPREP 26ML APPLICATOR (WOUND CARE) ×1 IMPLANT
GLOVE BIOGEL PI IND STRL 7.0 (GLOVE) ×1 IMPLANT
GLOVE BIOGEL PI IND STRL 7.5 (GLOVE) ×1 IMPLANT
GLOVE ECLIPSE 7.5 STRL STRAW (GLOVE) ×1 IMPLANT
GOWN STRL REUS W/TWL LRG LVL3 (GOWN DISPOSABLE) ×2 IMPLANT
HIBICLENS CHG 4% 4OZ BTL (MISCELLANEOUS) ×1 IMPLANT
LIGASURE IMPACT 36 18CM CVD LR (INSTRUMENTS) IMPLANT
NEEDLE HYPO 22GX1.5 SAFETY (NEEDLE) ×1 IMPLANT
NS IRRIG 1000ML POUR BTL (IV SOLUTION) ×1 IMPLANT
PACK ABDOMINAL MINOR (CUSTOM PROCEDURE TRAY) ×1 IMPLANT
PROTECTOR NERVE ULNAR (MISCELLANEOUS) ×1 IMPLANT
SPONGE LAP 4X18 RFD (DISPOSABLE) IMPLANT
SUT VICRYL 0 UR6 27IN ABS (SUTURE) ×1 IMPLANT
SUT VICRYL 4-0 PS2 18IN ABS (SUTURE) ×1 IMPLANT
SYR CONTROL 10ML LL (SYRINGE) ×1 IMPLANT
TOWEL OR 17X24 6PK STRL BLUE (TOWEL DISPOSABLE) ×2 IMPLANT
TRAY FOLEY W/BAG SLVR 14FR (SET/KITS/TRAYS/PACK) ×1 IMPLANT

## 2023-01-04 NOTE — Anesthesia Preprocedure Evaluation (Addendum)
Anesthesia Evaluation  Patient identified by MRN, date of birth, ID band Patient awake    Reviewed: Allergy & Precautions, NPO status , Patient's Chart, lab work & pertinent test results  Airway Mallampati: II  TM Distance: >3 FB Neck ROM: Full    Dental no notable dental hx.    Pulmonary asthma    Pulmonary exam normal        Cardiovascular negative cardio ROS  Rhythm:Regular Rate:Normal     Neuro/Psych  Headaches  Anxiety Depression       GI/Hepatic negative GI ROS, Neg liver ROS,,,  Endo/Other  negative endocrine ROS    Renal/GU negative Renal ROS  negative genitourinary   Musculoskeletal negative musculoskeletal ROS (+)    Abdominal Normal abdominal exam  (+)   Peds  Hematology Lab Results      Component                Value               Date                      WBC                      12.9 (H)            01/04/2023                HGB                      9.0 (L)             01/04/2023                HCT                      28.8 (L)            01/04/2023                MCV                      78.3 (L)            01/04/2023                PLT                      256                 01/04/2023              Anesthesia Other Findings   Reproductive/Obstetrics Desires permanent sterilization                             Anesthesia Physical Anesthesia Plan  ASA: 2  Anesthesia Plan: General   Post-op Pain Management:    Induction: Intravenous and Rapid sequence  PONV Risk Score and Plan: 3 and Ondansetron, Dexamethasone, Midazolam and Treatment may vary due to age or medical condition  Airway Management Planned: Mask and Oral ETT  Additional Equipment: None  Intra-op Plan:   Post-operative Plan: Extubation in OR  Informed Consent: I have reviewed the patients History and Physical, chart, labs and discussed the procedure including the risks, benefits and alternatives for  the proposed anesthesia with the patient or authorized representative who has indicated his/her understanding and acceptance.  Dental advisory given  Plan Discussed with: CRNA  Anesthesia Plan Comments:        Anesthesia Quick Evaluation

## 2023-01-04 NOTE — Op Note (Signed)
Audrey Matthews 01/04/2023  PREOPERATIVE DIAGNOSIS:  Undesired fertility  POSTOPERATIVE DIAGNOSIS:  Undesired fertility  PROCEDURE:  Postpartum Bilateral Tubal Sterilization using Ligasure   SURGEON:  Dr Candelaria Celeste  ASSISTANT: Sundra Aland, MD  ANESTHESIA:  Epidural  COMPLICATIONS:  None immediate.  ESTIMATED BLOOD LOSS:  23cc.  FLUIDS: 600 mL LR.  URINE OUTPUT:  Not measured  INDICATIONS: 22 y.o. yo O1H0865  with undesired fertility,status post vaginal delivery, desires permanent sterilization. Risks and benefits of procedure discussed with patient including permanence of method, bleeding, infection, injury to surrounding organs and need for additional procedures. Risk failure of 0.5-1% with increased risk of ectopic gestation if pregnancy occurs was also discussed with patient.   FINDINGS:  Normal uterus, tubes, and ovaries.  TECHNIQUE:  The patient was taken to the operating room where she was placed under general anesthesia.  She was then placed in the dorsal supine position and prepped and draped in sterile fashion.  After an adequate timeout was performed, attention was turned to the patient's abdomen where a small transverse skin incision was made under the umbilical fold. The incision was taken down to the layer of fascia using the scalpel, and fascia was incised, and extended bilaterally using Mayo scissors. The peritoneum was entered in a sharp fashion.   Attention was then turned to the patient's adnexa. The left fallopian tube was identified and followed out to the fimbriated end.  Ligasure device was used to cauterize and cut the mesosalpinx to proximal end of the fallopian tube, removing 6cm of tube. A similar process was carried out on the right side allowing for bilateral tubal sterilization.    Good hemostasis was noted overall.  Local analgesia was drizzled on both operative sites.The instruments were then removed from the patient's abdomen and the fascial  incision was repaired with 0 Vicryl. 30cc of Marcaine 0.25% was injected subcutaneously. The skin was then closed with a 4-0 Vicryl subcuticular stitch. The patient tolerated the procedure well.  Sponge, lap, and needle counts were correct times two.  The patient was then taken to the recovery room awake, extubated and in stable condition.   Sundra Aland, MD 01/04/2023 1:46 PM

## 2023-01-04 NOTE — Anesthesia Procedure Notes (Signed)
Procedure Name: Intubation Date/Time: 01/04/2023 12:19 PM  Performed by: Graciela Husbands, CRNAPre-anesthesia Checklist: Patient identified, Patient being monitored, Timeout performed, Emergency Drugs available and Suction available Patient Re-evaluated:Patient Re-evaluated prior to induction Oxygen Delivery Method: Circle System Utilized Preoxygenation: Pre-oxygenation with 100% oxygen Induction Type: IV induction and Rapid sequence Laryngoscope Size: Mac and 3 Grade View: Grade I Tube type: Oral Tube size: 7.0 mm Number of attempts: 1 Airway Equipment and Method: stylet Placement Confirmation: ETT inserted through vocal cords under direct vision, positive ETCO2 and breath sounds checked- equal and bilateral Secured at: 22 cm Tube secured with: Tape Dental Injury: Teeth and Oropharynx as per pre-operative assessment  Comments: Intubated by A. Willia Craze

## 2023-01-04 NOTE — Progress Notes (Signed)
POSTPARTUM PROGRESS NOTE  Post Partum Day 1  Subjective:  Audrey Matthews is a 22 y.o. [id number] s/p SVD at [redacted]w[redacted]d.  She reports she is doing well. No acute events overnight. She denies any problems with ambulating, voiding or po intake. Denies nausea or vomiting.  Pain is well controlled.  Lochia is < menses. Anxious about being able to have general anesthesia and not spinal.    Objective: Blood pressure 120/80, pulse 67, temperature 97.8 F (36.6 C), temperature source Oral, resp. rate 18, height 5\' 1"  (1.549 m), weight 78.5 kg, last menstrual period 04/26/2022, SpO2 100%, unknown if currently breastfeeding.  Physical Exam:  General: alert, cooperative and no distress Chest: no respiratory distress Heart:regular rate, distal pulses intact Uterine Fundus: firm, appropriately tender DVT Evaluation: No calf swelling or tenderness Extremities: trace edema Skin: warm, dry  Recent Labs    01/02/23 2339 01/04/23 0422  HGB 10.7* 9.0*  HCT 33.8* 28.8*    Assessment/Plan: Audrey Matthews is a 22 y.o. W2N5621 s/p SVD at [redacted]w[redacted]d   PPD#1 - Doing well  Routine postpartum care Acute blood loss anemia following SVD, clinically significant being drop from 10.7 to 9.0, will start PO iron supplementation  Contraception: planned for BTL at 1200 today, last meal 2030 10/13 Feeding: breast Dispo: Plan for discharge 10/15 if stable after BTL today.   LOS: 2 days   Hessie Dibble, MD OB Fellow  01/04/2023, 7:42 AM

## 2023-01-04 NOTE — Progress Notes (Signed)
Risks of procedure discussed with patient including but not limited to: risk of regret, permanence of method, bleeding, infection, injury to surrounding organs and need for additional procedures.  Failure risk of 1 -2 % with increased risk of ectopic gestation if pregnancy occurs was also discussed with patient.    Levie Heritage, DO 01/04/2023 12:16 PM

## 2023-01-04 NOTE — Lactation Note (Signed)
This note was copied from a baby's chart. Lactation Consultation Note  Patient Name: Audrey Matthews Today's Date: 01/04/2023 Age:23 hours Reason for consult: Follow-up assessment;Late-preterm 34-36.6wks;Infant < 6lbs;RN request  P2- RN asked LC what the slowest nipple that we have access to because when RN formula fed infant with the white Nfant nipple, a lot of the formula spilled out of the side of infant's mouth. LC reviewed the difference between the white, purple and gold Nfant nipples. RN thought maybe the gold would be best for infant until speech sees them tomorrow to do infant's last feeding being poor. LC provided MOB with the gold Nfant nipple and instructed her how to care for it.  While LC was in room, the SLP called on Vocera and stated that she was tubing up a nipple for infant to use. LC asked if it was the gold one because it had already been provided to MOB. SLP stated that it was not the gold one, but she was okay with the infant using the gold Nfant nipple until she sees them tomorrow.   LC encouraged MOB to call lactation for infant's next feeding so LC can assess infant's latch on the breast and the gold Nfant nipple.  Maternal Data Does the patient have breastfeeding experience prior to this delivery?: Yes  Feeding Mother's Current Feeding Choice: Breast Milk and Formula Nipple Type: Nfant Standard Flow (white)  Lactation Tools Discussed/Used Pump Education: Milk Storage  Interventions Interventions: Breast feeding basics reviewed;Education;Pace feeding;LC Services brochure  Discharge Discharge Education: Warning signs for feeding baby  Consult Status Consult Status: Follow-up Date: 01/05/23 Follow-up type: In-patient    Dema Severin BS, IBCLC 01/04/2023, 6:52 PM

## 2023-01-04 NOTE — Transfer of Care (Signed)
Immediate Anesthesia Transfer of Care Note  Patient: Audrey Matthews  Procedure(s) Performed: POST PARTUM TUBAL LIGATION (Abdomen)  Patient Location: PACU  Anesthesia Type:Spinal  Level of Consciousness: awake, alert , and oriented  Airway & Oxygen Therapy: Patient Spontanous Breathing  Post-op Assessment: Report given to RN and Post -op Vital signs reviewed and stable  Post vital signs: Reviewed and stable  Last Vitals:  Vitals Value Taken Time  BP 118/73 01/04/23 1330  Temp 36.6 C 01/04/23 1323  Pulse 82 01/04/23 1330  Resp 12 01/04/23 1330  SpO2 92 % 01/04/23 1330  Vitals shown include unfiled device data.  Last Pain:  Vitals:   01/04/23 1323  TempSrc: Oral  PainSc: 6          Complications: No notable events documented.

## 2023-01-04 NOTE — Anesthesia Postprocedure Evaluation (Signed)
Anesthesia Post Note  Patient: Producer, television/film/video  Procedure(s) Performed: POST PARTUM TUBAL LIGATION (Abdomen)     Patient location during evaluation: PACU Anesthesia Type: General Level of consciousness: awake and alert Pain management: pain level controlled Vital Signs Assessment: post-procedure vital signs reviewed and stable Respiratory status: spontaneous breathing, nonlabored ventilation, respiratory function stable and patient connected to nasal cannula oxygen Cardiovascular status: blood pressure returned to baseline and stable Postop Assessment: no apparent nausea or vomiting Anesthetic complications: no   No notable events documented.  Last Vitals:  Vitals:   01/04/23 1443 01/04/23 1539  BP: 116/81 116/74  Pulse: 64 74  Resp: 18 18  Temp: 36.8 C 36.9 C  SpO2: 94% 93%    Last Pain:  Vitals:   01/04/23 1539  TempSrc: Oral  PainSc: 0-No pain                 Earl Lites P Tuyet Bader

## 2023-01-05 ENCOUNTER — Ambulatory Visit (HOSPITAL_COMMUNITY): Payer: Self-pay

## 2023-01-05 LAB — RH IG WORKUP (INCLUDES ABO/RH)
Fetal Screen: NEGATIVE
Gestational Age(Wks): 36
Unit division: 0

## 2023-01-05 LAB — CULTURE, BETA STREP (GROUP B ONLY)

## 2023-01-05 MED ORDER — INFLUENZA VIRUS VACC SPLIT PF (FLUZONE) 0.5 ML IM SUSY
0.5000 mL | PREFILLED_SYRINGE | INTRAMUSCULAR | Status: AC
  Administered 2023-01-05: 0.5 mL via INTRAMUSCULAR
  Filled 2023-01-05: qty 0.5

## 2023-01-05 MED ORDER — IBUPROFEN 600 MG PO TABS: 600.0000 mg | ORAL_TABLET | Freq: Four times a day (QID) | ORAL | 0 refills | Status: AC

## 2023-01-05 MED ORDER — OXYCODONE-ACETAMINOPHEN 5-325 MG PO TABS
1.0000 | ORAL_TABLET | Freq: Four times a day (QID) | ORAL | 0 refills | Status: DC | PRN
Start: 2023-01-05 — End: 2023-11-18

## 2023-01-05 NOTE — Clinical Social Work Maternal (Signed)
CLINICAL SOCIAL WORK MATERNAL/CHILD NOTE   Patient Details  Name: Audrey Matthews MRN: [id number] Date of Birth: 05/29/2000   Date:  Feb 21, 2023   Clinical Social Worker Initiating Note:  Willaim Rayas Keyna Blizard      Date/Time: Initiated:  01/05/23/1059      Child's Name:  Dyke Maes    Biological Parents:  Mother, Father Posey Boyer Merrow 02/23/2000, Floreen Comber 07/28/1985)    Need for Interpreter:  None    Reason for Referral:  Behavioral Health Concerns    Address:  [address] [place] [address]    Phone number:  [phone] (home)      Additional phone number:    Household Members/Support Persons (HM/SP):   Household Member/Support Person 1, Household Member/Support Person 2, Household Member/Support Person 3     HM/SP Name Relationship DOB or Age  HM/SP -1 Lacy Duverney mom   HM/SP -2 Braulio Conte stepfather   HM/SP -3 Autumn Nedra Hai daugher 06/01/2019  HM/SP -4     HM/SP -5     HM/SP -6     HM/SP -7     HM/SP -8         Natural Supports (not living in the home):       Professional Supports: Therapist    Employment: Unemployed    Type of Work:      Education:  9 to 11 years    Homebound arranged: No   Financial Resources:  [employer]    Other Resources:  [employer]    Cultural/Religious Considerations Which May Impact Care:     Strengths:  Ability to meet basic needs  , Home prepared for child  , Pediatrician chosen    Psychotropic Medications:          Pediatrician:    Sara Lee   Pediatrician List:    [place]   [facility]  [facility]       Pediatrician Fax Number:     Risk Factors/Current Problems:  Mental Health Concerns      Cognitive State:  Able to Concentrate  , Alert      Mood/Affect:  Calm  , Comfortable      CSW Assessment: CSW received a consult for hx of Anxiety, Depression, Post traumatic Stress Disorder, Autism Spectrum Disorder and  Borderline Personality Disorder. CSW met with MOB to complete assessment and offer support. CSW entered the room,and observed MOB resting in bed, holding the infant and FOB as bedside. CSW introduced self, CSW role and reason for visit, MOB was agreeable to visit and allowed FOB to remain in the room. CSW inquired about how MOB was feeling. MOB reported good. CSW confirmed MOB's demographic information. MOB verified the information on file was correct.   CSW inquired about MOB MH hx, MOB reported she was diagnosed with Anxiety Depression and PTSD at age 22. MOB reported her symptoms were well controlled with Celexa. MOB reported she stopped taking the medication during pregnancy but plans to restart that medication. MOB reported she was also seeing a therapist throughout her pregnancy which was beneficial for her. CSW inquired if MOB would like to follow up with Asher Muir postpartum MOB reported yes she would like postpartum follow up, CSW notified Asher Muir of MOB's request. MOB reported she was diagnosed with ASD at 22 and BPD at 22 and she reports no concerns with these diagnosis. CSW assessed for safety, MOB denied any SI or HI.  CSW provided education regarding the baby blues period vs. perinatal mood disorders, discussed treatment and gave resources for mental health follow up if concerns arise.  CSW recommends self-evaluation during the postpartum time period using the New Mom Checklist from Postpartum Progress and encouraged MOB to contact a medical professional if symptoms are noted at any time. MOB identified her mom and FOB as her supports.      CSW provided review of Sudden Infant Death Syndrome (SIDS) precautions.  MOB reported she has all necessary items for the infant including a bassinet and car seat.  CSW identifies no further need for intervention and no barriers to discharge at this time.   CSW Plan/Description:  No Further Intervention Required/No Barriers to Discharge, Sudden Infant Death Syndrome  (SIDS) Education, Perinatal Mood and Anxiety Disorder (PMADs) Education, Other Information/Referral to [employer], LCSW October 12, 2022, 11:26 AM

## 2023-01-05 NOTE — Lactation Note (Signed)
This note was copied from a baby's chart. Lactation Consultation Note  Patient Name: Audrey Matthews Today's Date: 01/05/2023 Age:23 hours Reason for consult: Mother's request;Late-preterm 34-36.6wks;Hyperbilirubinemia Baby wasn't discharged home d/t elevated bili. Baby on double photo therapy. Baby very fussy. Encouraged mom to give more supplement especially if baby doesn't go to the breast and just takes supplement. Encouraged to give at least 30 ml. Baby got a pacifier d/t photo therapy. Baby calmed down. Mom c/o breast and nipple pain. Stated it felt like needles to pump and latch baby. Breast filling but not engorged. Mom asked for a bigger flange. Mom has been using #24 flange, stated she uses #27 at home. LC got mom #27 flange. Suggested mom use coconut oil before pumping. Mom applied coconut oil. Praised mom for having good amount of transitional milk.  Encouraged mom to call for latch assistance if needed. Maternal Data    Feeding Mother's Current Feeding Choice: Breast Milk and Formula Nipple Type: Dr. Irving Burton level 1  LATCH Score       Type of Nipple: Flat  Comfort (Breast/Nipple): Filling, red/small blisters or bruises, mild/mod discomfort (filling/sore/ nipples hurt)         Lactation Tools Discussed/Used Tools: Pump;Flanges;Coconut oil Flange Size: 27 (mom asked for #27 flanges) Breast pump type: Double-Electric Breast Pump Pump Education: Milk Storage Reason for Pumping: supplementation  Interventions Interventions: Coconut oil;DEBP  Discharge    Consult Status Consult Status: Follow-up Date: 01/06/23 Follow-up type: In-patient    Charyl Dancer 01/05/2023, 8:31 PM

## 2023-01-05 NOTE — Lactation Note (Signed)
This note was copied from a baby's chart. Lactation Consultation Note  Patient Name: Audrey Matthews Today's Date: 01/05/2023 Age:22 years Reason for consult: Follow-up assessment;Late-preterm 34-36.6wks;Infant < 6lbs;Infant weight loss (5 % weight loss) As LC entered the room mom feeding the baby with a bottle with start time 10 am.  Per mom baby is latching well and feeds with the white nipple better than the god nipple.  LC reviewed BF D/C teaching and the LPT feeding plan of feeding with cues and by 3 hours.  Offer the breast 1st and if the baby latches - feed 20 mins and then supplement with EBM or formula if not available increasing to at least 30 ml per feeding.  LC mentioned as the baby gets older, gaining weight steadily and is over 6 pounds to give her more time at the breast.  LC recommended keeping track of I/O 's for at least 2 weeks and until the baby is gaining well.  LC offered mom and LC O/P appt and mom receptive.   Maternal Data    Feeding Mother's Current Feeding Choice: Breast Milk and Formula Nipple Type: Nfant Standard Flow (white)  LATCH Score 7-9   LC did not check the latch , per mom had attempted to latch and baby wasn't interested and presently feeding the bottle.   Lactation Tools Discussed/Used Tools: Pump Breast pump type: Double-Electric Breast Pump Pump Education: Milk Storage Pumped volume: 30 mL (milk in the refrigerator)  Interventions Interventions: Breast feeding basics reviewed;Education;Pace feeding;LC Services brochure  Discharge Discharge Education: Engorgement and breast care;Warning signs for feeding baby;Outpatient recommendation;Outpatient Epic message sent;Other (comment) (mom aware LC O/P Jasmine December will call her to set up the appt) Pump: Personal;DEBP  Consult Status Consult Status: Complete Date: 01/05/23    Kathrin Greathouse 01/05/2023, 10:22 AM

## 2023-01-06 ENCOUNTER — Encounter: Payer: MEDICAID | Admitting: Women's Health

## 2023-01-06 LAB — SURGICAL PATHOLOGY

## 2023-01-12 ENCOUNTER — Encounter: Payer: Self-pay | Admitting: *Deleted

## 2023-01-12 ENCOUNTER — Other Ambulatory Visit: Payer: Self-pay

## 2023-01-12 DIAGNOSIS — R42 Dizziness and giddiness: Secondary | ICD-10-CM

## 2023-01-12 NOTE — Progress Notes (Signed)
ZIO XT monitor [id number] caused burning and redness.  Monitor cancelled due to allergic reaction.  Audrey Matthews at [place] notified to cancel charges.  New order for [facility] Scientific monitor with 2 sensitive skin options ordered as a replacement.

## 2023-01-13 ENCOUNTER — Encounter: Payer: MEDICAID | Admitting: Women's Health

## 2023-01-15 NOTE — Plan of Care (Signed)
 [facility] Tonsillectomy/Adenoidectomy, Postoperative PEDS care plan entered in error.

## 2023-01-16 DIAGNOSIS — R42 Dizziness and giddiness: Secondary | ICD-10-CM | POA: Diagnosis not present

## 2023-01-16 DIAGNOSIS — R55 Syncope and collapse: Secondary | ICD-10-CM

## 2023-01-20 ENCOUNTER — Encounter: Payer: MEDICAID | Admitting: Women's Health

## 2023-01-21 ENCOUNTER — Ambulatory Visit (HOSPITAL_COMMUNITY): Payer: MEDICAID

## 2023-01-27 ENCOUNTER — Encounter: Payer: MEDICAID | Admitting: Advanced Practice Midwife

## 2023-01-27 ENCOUNTER — Telehealth (HOSPITAL_COMMUNITY): Payer: Self-pay

## 2023-01-27 DIAGNOSIS — Z1331 Encounter for screening for depression: Secondary | ICD-10-CM

## 2023-01-27 NOTE — Telephone Encounter (Signed)
01/27/2023 1211  Name: Audrey Matthews MRN: [id number] DOB: 07-19-2000  Reason for Call:  Transition of Care Hospital Discharge Call  Contact Status: Patient Contact Status: Complete  Language assistant needed: Interpreter Mode: Interpreter Not Needed        Follow-Up Questions: Do You Have Any Concerns About Your Health As You Heal From Delivery?: No Do You Have Any Concerns About Your Infants Health?: No  Edinburgh Postnatal Depression Scale:  In the Past 7 Days: I have been able to laugh and see the funny side of things.: As much as I always could I have looked forward with enjoyment to things.: Rather less than I used to I have blamed myself unnecessarily when things went wrong.: Not very often I have been anxious or worried for no good reason.: No, not at all I have felt scared or panicky for no good reason.: No, not at all Things have been getting on top of me.: No, most of the time I have coped quite well I have been so unhappy that I have had difficulty sleeping.: Not at all I have felt sad or miserable.: Not very often I have been so unhappy that I have been crying.: Only occasionally The thought of harming myself has occurred to me.: Hardly ever Edinburgh Postnatal Depression Scale Total: 6  PHQ2-9 Depression Scale:     Discharge Follow-up: Edinburgh score requires follow up?: Yes (Positive answer to question #10.) Provider notified of Edinburgh score?: Yes (Dr. Crissie Reese called and epic message sent to inform MD of EPDS score today and + answer to question #10.) Patient was advised of the following resources:: Support Group, Breastfeeding Support Group Patient referred to:: OB  Post-discharge interventions: Reviewed Newborn Safe Sleep Practices Maternal Mental Health Resources provided [facility] referral in patient's chart  Signature  Signe Colt

## 2023-02-04 ENCOUNTER — Ambulatory Visit: Payer: MEDICAID | Attending: Cardiology

## 2023-02-04 DIAGNOSIS — R42 Dizziness and giddiness: Secondary | ICD-10-CM

## 2023-02-08 ENCOUNTER — Ambulatory Visit (INDEPENDENT_AMBULATORY_CARE_PROVIDER_SITE_OTHER): Payer: MEDICAID | Admitting: Clinical

## 2023-02-08 DIAGNOSIS — F909 Attention-deficit hyperactivity disorder, unspecified type: Secondary | ICD-10-CM

## 2023-02-08 DIAGNOSIS — F4321 Adjustment disorder with depressed mood: Secondary | ICD-10-CM

## 2023-02-08 DIAGNOSIS — F4312 Post-traumatic stress disorder, chronic: Secondary | ICD-10-CM

## 2023-02-08 DIAGNOSIS — F840 Autistic disorder: Secondary | ICD-10-CM | POA: Diagnosis not present

## 2023-02-08 NOTE — Patient Instructions (Signed)
[facility] [address] [place], [place] [address] [phone] (main office) [phone] (Sedalia Greeson's office)  New Parent Support Groups [email or web address]   

## 2023-02-08 NOTE — BH Specialist Note (Signed)
Integrated Behavioral Health via Telemedicine Visit  02/08/2023 [name] [id number]  Number of Integrated Behavioral Health Clinician visits: Additional Visit  Session Start time: 1426   Session End time: 1452  Total time in minutes: 26   Referring Provider: Shawna Clamp, CNM Patient/Family location: Home [facility] Provider location: [facility]  All persons participating in visit: [name] and [facility] Daziah Hesler   Types of Service: Individual psychotherapy and Video visit  I connected with Cynthie Timpone and/or Sharalee Dehaan's  n/a  via  Telephone or Video Enabled Telemedicine Application  (Video is Caregility application) and verified that I am speaking with the correct person using two identifiers. Discussed confidentiality: Yes   I discussed the limitations of telemedicine and the availability of in person appointments.  Discussed there is a possibility of technology failure and discussed alternative modes of communication if that failure occurs.  I discussed that engaging in this telemedicine visit, they consent to the provision of behavioral healthcare and the services will be billed under their insurance.  Patient and/or legal guardian expressed understanding and consented to Telemedicine visit: Yes   Presenting Concerns: Patient and/or family reports the following symptoms/concerns: Processing birth experience, preceded by loss of SIL's baby/stilbirth;  followed by news of mother and stepdad's upcoming divorce (week of birth), then loss of cousin (about a week after birth) by suspected murder. Duration of problem: Ongoing with recent losses/life changes; Severity of problem: moderate  Patient and/or Family's Strengths/Protective Factors: Social connections, Concrete supports in place (healthy food, safe environments, etc.), and Sense of purpose   Goals Addressed: Patient will:  Reduce symptoms of:  anxiety and stress    Demonstrate ability to: Increase healthy adjustment to current life circumstances, Increase adequate support systems for patient/family, and Begin healthy grieving over loss  Progress towards Goals: Ongoing  Interventions: Interventions utilized:  Link to [employer] and Supportive Reflection Standardized Assessments completed: Not Needed  Patient and/or Family Response: Patient agrees with treatment plan.   Assessment: Patient currently experiencing Autism spectrum disorder; Post-traumatic stress disorder; ADHD; Grief  Patient may benefit from continued therapeutic intervention  .  Plan: Follow up with behavioral health clinician on : One month Behavioral recommendations:  -Continue prioritizing healthy self-care (regular meals, adequate rest; allowing practical help from supportive friends and family) until at least postpartum medical appointment -Consider new mom support group as needed at either [email or web address] or [email or web address]  -Continue allowing time and space to process loss of beloved cousin and mom and stepdad's upcoming divorce Referral(s): Integrated Art gallery manager (In Clinic) and [employer]:  new mom support  I discussed the assessment and treatment plan with the patient and/or parent/guardian. They were provided an opportunity to ask questions and all were answered. They agreed with the plan and demonstrated an understanding of the instructions.   They were advised to call back or seek an in-person evaluation if the symptoms worsen or if the condition fails to improve as anticipated.  [name] Marrah Vanevery, LCSW

## 2023-02-15 ENCOUNTER — Ambulatory Visit: Payer: MEDICAID | Admitting: Women's Health

## 2023-02-15 ENCOUNTER — Encounter: Payer: Self-pay | Admitting: Women's Health

## 2023-02-15 DIAGNOSIS — F418 Other specified anxiety disorders: Secondary | ICD-10-CM

## 2023-02-15 DIAGNOSIS — Z9079 Acquired absence of other genital organ(s): Secondary | ICD-10-CM

## 2023-02-15 NOTE — Progress Notes (Signed)
POSTPARTUM VISIT Patient name: Audrey Matthews MRN [id number]  Date of birth: 2000/09/05 Chief Complaint:   Postpartum Care  History of Present Illness:   Audrey Matthews is a 22 y.o. [phone] Caucasian female being seen today for a postpartum visit. She is 6 weeks postpartum following a spontaneous vaginal delivery at 36.0 gestational weeks d/t PTL. IOL: no, for n/a. Anesthesia:  IV fentanyl .  Laceration: none.  Complications: none. Inpatient contraception: yes BTS .   Pregnancy complicated by h/o cervical incompetence . Tobacco use: no. Substance use disorder: no. Last pap smear: 11/05/20 and results were NILM w/ HRHPV not done. Next pap smear due: 2025 Patient's last menstrual period was 02/02/2023 (exact date).  Postpartum course has been uncomplicated. Occ sharp pain Rt mid abd. No fever/chills. Bleeding none. Bowel function is normal. Bladder function is normal. Feels some pain at urethera when sitting on toilet. Urinary incontinence? no, fecal incontinence? no Patient is not sexually active. Last sexual activity: prior to birth of baby. Desired contraception: s/p BTS. Patient does not want a pregnancy in the future.  Desired family size is 2 children.   Upstream - 02/15/23 1405       Pregnancy Intention Screening   Does the patient want to become pregnant in the next year? No    Would the patient like to discuss contraceptive options today? No      Contraception Wrap Up   Current Method Female Sterilization   Tubal   End Method Female Sterilization    Contraception Counseling Provided No            The pregnancy intention screening data noted above was reviewed. Potential methods of contraception were discussed. The patient elected to proceed with Female Sterilization.  Edinburgh Postpartum Depression Screening: positive: H/O mental health disorder: yes dep/anx, h/o PPD. Currently on meds: no.  Currently in therapy: yes had appt w/ Asher Muir last week, has another in Dec.   Sleeping: not well.  Appetite: not good.  Still finds joy in things she used to: No.  Support at home: yes.  SI/HI/II: no.  Interested in medicine: Yes.  Interested in therapy: Yes. Has been on meds in past by PCP ([facility]), doesn't remember name/dosage but it worked well for her.   Edinburgh Postnatal Depression Scale - 02/15/23 1400       Edinburgh Postnatal Depression Scale:  In the Past 7 Days   I have been able to laugh and see the funny side of things. 1    I have looked forward with enjoyment to things. 1    I have blamed myself unnecessarily when things went wrong. 2    I have been anxious or worried for no good reason. 3    I have felt scared or panicky for no good reason. 1    Things have been getting on top of me. 2    I have been so unhappy that I have had difficulty sleeping. 3    I have felt sad or miserable. 2    I have been so unhappy that I have been crying. 2    The thought of harming myself has occurred to me. 0    Edinburgh Postnatal Depression Scale Total 17                07/20/2022   10:01 AM 11/05/2020    1:43 PM  GAD 7 : Generalized Anxiety Score  Nervous, Anxious, on Edge 1 1  Control/stop worrying 2 1  Worry  too much - different things 1 1  Trouble relaxing 1 1  Restless 0 1  Easily annoyed or irritable 2 2  Afraid - awful might happen 1 1  Total GAD 7 Score 8 8     Baby's course has been uncomplicated. Baby is feeding by bottle. Infant has a pediatrician/family doctor? Yes.  Childcare strategy if returning to work/school: n/a-stay at home mom.    Review of Systems:   Pertinent items are noted in HPI Denies Abnormal vaginal discharge w/ itching/odor/irritation, headaches, visual changes, shortness of breath, chest pain, abdominal pain, severe nausea/vomiting, or problems with urination or bowel movements. Pertinent History Reviewed:  Reviewed past medical,surgical, obstetrical and family history.  Reviewed problem list, medications and  allergies. OB History  Gravida Para Term Preterm AB Living  3 2   2 1 2   SAB IAB Ectopic Multiple Live Births  1     0 2    # Outcome Date GA Lbr Len/2nd Weight Sex Type Anes PTL Lv  3 Preterm 01/03/23 [redacted]w[redacted]d  5 lb 5.7 oz (2.43 kg) F Vag-Spont None  LIV  2 SAB 06/2021          1 Preterm 06/01/19 [redacted]w[redacted]d 01:38 / 00:06 2 lb 7.2 oz (1.11 kg) F Vag-Spont None  LIV     Complications: Short cervix with cervical cerclage in second trimester, antepartum, Preterm premature rupture of membranes   Physical Assessment:   Vitals:   02/15/23 1402  BP: 114/74  Pulse: (!) 52  Weight: 162 lb (73.5 kg)  Height: 5\' 1"  (1.549 m)  Body mass index is 30.61 kg/m.       Physical Examination:   General appearance: alert, well appearing, and in no distress  Mental status: alert, oriented to person, place, and time  Skin: warm & dry   Cardiovascular: normal heart rate noted   Respiratory: normal respiratory effort, no distress   Breasts: deferred, no complaints   Abdomen: soft, non-tender, BTS incision healing well, no s/s infection  Pelvic: VULVA: lacs well healed, urethra appears normal; normal appearing vulva with no masses, tenderness or lesions. Thin prep pap obtained: No  Rectal: not examined  Extremities: Edema: none   Chaperone: Sherri Kaywood         No results found for this or any previous visit (from the past 24 hour(s)).  Assessment & Plan:  1) Postpartum exam 2) 6 wks s/p spontaneous vaginal delivery @ 36.0wks d/t PTL 3) bottle feeding 4) Depression screening 5) Contraception s/p BTS 6) Dep/anx> no meds currently, has been on meds in past that worked well (rx'd by PCP [facility]) doesn't remember name/dosage. To call them today for appt to discuss resuming. Keep appt w/ Jamie/IBH 7) Occ Rt mid abd pain> normal exam, reviewed warning s/s/reasons to seek care 8) Urethral pain when sitting on toilet> not w/ urination, normal exam  Essential components of care per ACOG  recommendations:  1.  Mood and well being:  If positive depression screen, discussed and plan developed.  If using tobacco we discussed reduction/cessation and risk of relapse If current substance abuse, we discussed and referral to local resources was offered.   2. Infant care and feeding:  If breastfeeding, discussed returning to work, pumping, breastfeeding-associated pain, guidance regarding return to fertility while lactating if not using another method. If needed, patient was provided with a letter to be allowed to pump q 2-3hrs to support lactation in a private location with access to a refrigerator to store breastmilk.  Recommended that all caregivers be immunized for flu, pertussis and other preventable communicable diseases If pt does not have material needs met for her/baby, referred to local resources for help obtaining these.  3. Sexuality, contraception and birth spacing Provided guidance regarding sexuality, management of dyspareunia, and resumption of intercourse Discussed avoiding interpregnancy interval <16mths and recommended birth spacing of 18 months  4. Sleep and fatigue Discussed coping options for fatigue and sleep disruption Encouraged family/partner/community support of 4 hrs of uninterrupted sleep to help with mood and fatigue  5. Physical recovery  If pt had a C/S, assessed incisional pain and providing guidance on normal vs prolonged recovery If pt had a laceration, perineal healing and pain reviewed.  If urinary or fecal incontinence, discussed management and referred to PT or uro/gyn if indicated  Patient is safe to resume physical activity. Discussed attainment of healthy weight.  6.  Chronic disease management Discussed pregnancy complications if any, and their implications for future childbearing and long-term maternal health. Review recommendations for prevention of recurrent pregnancy complications, such as 17 hydroxyprogesterone caproate to reduce risk  for recurrent PTB not applicable, s/p BTS, or aspirin to reduce risk of preeclampsia not applicable. Pt had GDM: no. If yes, 2hr GTT scheduled: not applicable. Reviewed medications and non-pregnant dosing including consideration of whether pt is breastfeeding using a reliable resource such as LactMed: not applicable Referred for f/u w/ PCP or subspecialist providers as indicated: yes  7. Health maintenance Mammogram at 23yo or earlier if indicated Pap smears as indicated  Meds: No orders of the defined types were placed in this encounter.   Follow-up: Return for Aug for , Pap & physical.   No orders of the defined types were placed in this encounter.   Cheral Marker CNM, [facility] 02/15/2023 2:30 PM

## 2023-02-25 ENCOUNTER — Ambulatory Visit (HOSPITAL_COMMUNITY): Payer: MEDICAID | Attending: Internal Medicine

## 2023-02-25 DIAGNOSIS — R42 Dizziness and giddiness: Secondary | ICD-10-CM | POA: Insufficient documentation

## 2023-02-25 DIAGNOSIS — R55 Syncope and collapse: Secondary | ICD-10-CM | POA: Diagnosis not present

## 2023-02-25 LAB — ECHOCARDIOGRAM COMPLETE
Area-P 1/2: 2.77 cm2
S' Lateral: 3.2 cm

## 2023-02-26 ENCOUNTER — Encounter: Payer: Self-pay | Admitting: Cardiology

## 2023-03-01 NOTE — BH Specialist Note (Unsigned)
Integrated Behavioral Health via Telemedicine Visit  03/01/2023 Audrey Matthews [id number]  Number of Integrated Behavioral Health Clinician visits: Additional Visit  Session Start time: 1426   Session End time: 1452  Total time in minutes: 26   Referring Provider: Shawna Clamp, CNM Patient/Family location: Home*** [facility] Provider location: [facility]  All persons participating in visit: [name] and Audrey Matthews ***  Types of Service: {CHL AMB TYPE OF SERVICE:[phone]}  I connected with Audrey Matthews and/or Audrey Matthews's {family members:20773} via  Telephone or Video Enabled Telemedicine Application  (Video is Caregility application) and verified that I am speaking with the correct person using two identifiers. Discussed confidentiality: Yes   I discussed the limitations of telemedicine and the availability of in person appointments.  Discussed there is a possibility of technology failure and discussed alternative modes of communication if that failure occurs.  I discussed that engaging in this telemedicine visit, they consent to the provision of behavioral healthcare and the services will be billed under their insurance.  Patient and/or legal guardian expressed understanding and consented to Telemedicine visit: Yes   Presenting Concerns: Patient and/or family reports the following symptoms/concerns: *** Duration of problem: ***; Severity of problem: {Mild/Moderate/Severe:20260}  Patient and/or Family's Strengths/Protective Factors: {CHL AMB BH PROTECTIVE FACTORS:[phone]}  Goals Addressed: Patient will:  Reduce symptoms of: {IBH Symptoms:21014056}   Increase knowledge and/or ability of: {IBH Patient Tools:21014057}   Demonstrate ability to: {IBH Goals:21014053}  Progress towards Goals: {CHL AMB BH PROGRESS TOWARDS GOALS:[phone]}  Interventions: Interventions utilized:  {IBH  Interventions:21014054} Standardized Assessments completed: {IBH Screening Tools:21014051}  Patient and/or Family Response: Patient agrees with treatment plan.   Assessment: Patient currently experiencing ***.   Patient may benefit from continued therapeutic intervention *** .  Plan: Follow up with behavioral health clinician on : *** Behavioral recommendations:  -*** -*** Referral(s): {IBH Referrals:21014055}  I discussed the assessment and treatment plan with the patient and/or parent/guardian. They were provided an opportunity to ask questions and all were answered. They agreed with the plan and demonstrated an understanding of the instructions.   They were advised to call back or seek an in-person evaluation if the symptoms worsen or if the condition fails to improve as anticipated.  Rae Lips, LCSW     07/20/2022   10:01 AM 11/05/2020    1:43 PM 02/09/2019    9:50 AM 12/27/2018    1:48 PM  Depression screen PHQ 2/9  Decreased Interest 0 1 0 1  Down, Depressed, Hopeless 0 1 0 1  PHQ - 2 Score 0 2 0 2  Altered sleeping 1 1 1  0  Tired, decreased energy 1 1 1 1   Change in appetite 2 0 0 2  Feeling bad or failure about yourself  0 1 0 1  Trouble concentrating 1 1 2  0  Moving slowly or fidgety/restless 0 0 3 1  Suicidal thoughts 0 0 0 0  PHQ-9 Score 5 6 7 7   Difficult doing work/chores   Somewhat difficult       07/20/2022   10:01 AM 11/05/2020    1:43 PM  GAD 7 : Generalized Anxiety Score  Nervous, Anxious, on Edge 1 1  Control/stop worrying 2 1  Worry too much - different things 1 1  Trouble relaxing 1 1  Restless 0 1  Easily annoyed or irritable 2 2  Afraid - awful might happen 1 1  Total GAD 7 Score 8 8

## 2023-03-15 ENCOUNTER — Ambulatory Visit: Payer: MEDICAID | Admitting: Clinical

## 2023-03-15 DIAGNOSIS — F840 Autistic disorder: Secondary | ICD-10-CM

## 2023-03-15 DIAGNOSIS — F4321 Adjustment disorder with depressed mood: Secondary | ICD-10-CM | POA: Diagnosis not present

## 2023-03-15 DIAGNOSIS — F909 Attention-deficit hyperactivity disorder, unspecified type: Secondary | ICD-10-CM

## 2023-03-15 DIAGNOSIS — F4312 Post-traumatic stress disorder, chronic: Secondary | ICD-10-CM

## 2023-03-17 ENCOUNTER — Encounter: Payer: Self-pay | Admitting: Women's Health
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
## 2023-04-06 ENCOUNTER — Ambulatory Visit (INDEPENDENT_AMBULATORY_CARE_PROVIDER_SITE_OTHER): Payer: MEDICAID | Admitting: Clinical

## 2023-04-06 DIAGNOSIS — F840 Autistic disorder: Secondary | ICD-10-CM

## 2023-04-06 DIAGNOSIS — F4312 Post-traumatic stress disorder, chronic: Secondary | ICD-10-CM

## 2023-04-06 DIAGNOSIS — F4321 Adjustment disorder with depressed mood: Secondary | ICD-10-CM

## 2023-04-06 DIAGNOSIS — F909 Attention-deficit hyperactivity disorder, unspecified type: Secondary | ICD-10-CM

## 2023-04-06 NOTE — Patient Instructions (Signed)
[facility] for [facility] at [facility] [address] [place], [place] [address] [phone] (main office) [phone] [facility] office)  [facility]  [address] [place], [place] [address] [phone] [email or web address]  [facility]:  [address], 2nd floor, [place], [place] [address] [phone])  Main line [phone]  [place] location **Parking is available in the [address] parking deck.  [place]:  [address] [place], [place] [address] [phone]) Main line [phone]  [place] location  **Located on the backside of the [facility] [place]. Limited parking is available off of [address]  [place] hours: Monday -Friday 8:30am-4:30pm  [email or web address]    If immediate emergency, call 9-1-1, or [facility] the [place] 24 hour crisis hotline at: (719)240-0127  [facility] [address], [place], [place] [address] [phone] **temporary housing for victims of domestic violence  [place]:  [employer] [facility]  [address] [place], [place] [address] [phone]  [email or web address]  Monday-Friday, 8:30am-5:00pm
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
## 2023-04-11 ENCOUNTER — Encounter: Payer: Self-pay | Admitting: Cardiology

## 2023-04-20 ENCOUNTER — Ambulatory Visit (INDEPENDENT_AMBULATORY_CARE_PROVIDER_SITE_OTHER): Payer: MEDICAID | Admitting: Clinical

## 2023-04-20 DIAGNOSIS — F909 Attention-deficit hyperactivity disorder, unspecified type: Secondary | ICD-10-CM

## 2023-04-20 DIAGNOSIS — F4312 Post-traumatic stress disorder, chronic: Secondary | ICD-10-CM

## 2023-04-20 DIAGNOSIS — F840 Autistic disorder: Secondary | ICD-10-CM | POA: Diagnosis not present

## 2023-04-27 NOTE — BH Specialist Note (Unsigned)
Integrated Behavioral Health via Telemedicine Visit  04/27/2023 Audrey Matthews [id number]  Number of Integrated Behavioral Health Clinician visits: Additional Visit  Session Start time: 1520   Session End time: 1554  Total time in minutes: 34   Referring Provider: Shawna Matthews, CNM Patient/Family location: Home*** [facility] Provider location: [facility]  All persons participating in visit: [name] and [facility] Audrey Matthews ***  Types of Service: {CHL AMB TYPE OF SERVICE:[phone]}  I connected with Audrey Matthews and/or Audrey Matthews's {family members:[id number]} via  Telephone or Video Enabled Telemedicine Application  (Video is Caregility application) and verified that I am speaking with the correct person using two identifiers. Discussed confidentiality: Yes   I discussed the limitations of telemedicine and the availability of in person appointments.  Discussed there is a possibility of technology failure and discussed alternative modes of communication if that failure occurs.  I discussed that engaging in this telemedicine visit, they consent to the provision of behavioral healthcare and the services will be billed under their insurance.  Patient and/or legal guardian expressed understanding and consented to Telemedicine visit: Yes   Presenting Concerns: Patient and/or family reports the following symptoms/concerns: *** Duration of problem: ***; Severity of problem: {Mild/Moderate/Severe:20260}  Patient and/or Family's Strengths/Protective Factors: {CHL AMB BH PROTECTIVE [phone]}  Goals Addressed: Patient will:  Reduce symptoms of: {IBH Symptoms:21014056}   Increase knowledge and/or ability of: {IBH Patient Tools:21014057}   Demonstrate ability to: {IBH Goals:21014053}  Progress towards Goals: {CHL AMB BH PROGRESS TOWARDS GOALS:[phone]}  Interventions: Interventions utilized:  {IBH  Interventions:21014054} Standardized Assessments completed: {IBH Screening Tools:21014051}  Patient and/or Family Response: Patient agrees with treatment plan. ***  Assessment: Patient currently experiencing ***.   Patient may benefit from continued therapeutic intervention *** .  Plan: Follow up with behavioral health clinician on : *** Behavioral recommendations:  -*** -*** Referral(s): {IBH Referrals:21014055}  I discussed the assessment and treatment plan with the patient and/or parent/guardian. They were provided an opportunity to ask questions and all were answered. They agreed with the plan and demonstrated an understanding of the instructions.   They were advised to call back or seek an in-person evaluation if the symptoms worsen or if the condition fails to improve as anticipated.  Audrey Lips, LCSW     07/20/2022   10:01 AM 11/05/2020    1:43 PM 02/09/2019    9:50 AM 12/27/2018    1:48 PM  Depression screen PHQ 2/9  Decreased Interest 0 1 0 1  Down, Depressed, Hopeless 0 1 0 1  PHQ - 2 Score 0 2 0 2  Altered sleeping 1 1 1  0  Tired, decreased energy 1 1 1 1   Change in appetite 2 0 0 2  Feeling bad or failure about yourself  0 1 0 1  Trouble concentrating 1 1 2  0  Moving slowly or fidgety/restless 0 0 3 1  Suicidal thoughts 0 0 0 0  PHQ-9 Score 5 6 7 7   Difficult doing work/chores   Somewhat difficult       07/20/2022   10:01 AM 11/05/2020    1:43 PM  GAD 7 : Generalized Anxiety Score  Nervous, Anxious, on Edge 1 1  Control/stop worrying 2 1  Worry too much - different things 1 1  Trouble relaxing 1 1  Restless 0 1  Easily annoyed or irritable 2 2  Afraid - awful might happen 1 1  Total GAD 7 Score 8 8

## 2023-05-05 ENCOUNTER — Encounter: Payer: MEDICAID | Admitting: Clinical
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
## 2023-05-11 ENCOUNTER — Encounter: Payer: Self-pay | Admitting: Women's Health

## 2023-05-19 ENCOUNTER — Other Ambulatory Visit: Payer: Self-pay

## 2023-05-19 ENCOUNTER — Ambulatory Visit: Payer: MEDICAID | Admitting: Clinical

## 2023-05-19 DIAGNOSIS — F4312 Post-traumatic stress disorder, chronic: Secondary | ICD-10-CM

## 2023-05-19 DIAGNOSIS — F909 Attention-deficit hyperactivity disorder, unspecified type: Secondary | ICD-10-CM

## 2023-05-19 DIAGNOSIS — F840 Autistic disorder: Secondary | ICD-10-CM

## 2023-05-19 DIAGNOSIS — F430 Acute stress reaction: Secondary | ICD-10-CM | POA: Diagnosis not present

## 2023-05-24 NOTE — BH Specialist Note (Unsigned)
 Integrated Behavioral Health via Telemedicine Visit  05/24/2023 Audrey Matthews [id number]  Number of Integrated Behavioral Health Clinician visits: Additional Visit  Session Start time: 1047   Session End time: 1120  Total time in minutes: 33   Referring Provider: Shawna Clamp, CNM Patient/Family location: Home*** [facility] Provider location: [facility]  All persons participating in visit: [name] and [facility] Audrey Matthews ***  Types of Service: {CHL AMB TYPE OF SERVICE:[phone]}  I connected with Audrey Matthews and/or Audrey Matthews's {family members:[id number]} via  Telephone or Video Enabled Telemedicine Application  (Video is Caregility application) and verified that I am speaking with the correct person using two identifiers. Discussed confidentiality: Yes   I discussed the limitations of telemedicine and the availability of in person appointments.  Discussed there is a possibility of technology failure and discussed alternative modes of communication if that failure occurs.  I discussed that engaging in this telemedicine visit, they consent to the provision of behavioral healthcare and the services will be billed under their insurance.  Patient and/or legal guardian expressed understanding and consented to Telemedicine visit: Yes   Presenting Concerns: Patient and/or family reports the following symptoms/concerns: *** Duration of problem: ***; Severity of problem: {Mild/Moderate/Severe:20260}  Patient and/or Family's Strengths/Protective Factors: {CHL AMB BH PROTECTIVE FACTORS:[phone]}  Goals Addressed: Patient will:  Reduce symptoms of: {IBH Symptoms:21014056}   Increase knowledge and/or ability of: {IBH Patient Tools:21014057}   Demonstrate ability to: {IBH Goals:21014053}  Progress towards Goals: {CHL AMB BH PROGRESS TOWARDS GOALS:[phone]}  Interventions: Interventions utilized:  {IBH  Interventions:21014054} Standardized Assessments completed: {IBH Screening Tools:21014051}  Patient and/or Family Response: Patient agrees with treatment plan. ***  Assessment: Patient currently experiencing ***.   Patient may benefit from continued therapeutic intervention *** .  Plan: Follow up with behavioral health clinician on : *** Behavioral recommendations:  -*** -*** Referral(s): {IBH Referrals:21014055}  I discussed the assessment and treatment plan with the patient and/or parent/guardian. They were provided an opportunity to ask questions and all were answered. They agreed with the plan and demonstrated an understanding of the instructions.   They were advised to call back or seek an in-person evaluation if the symptoms worsen or if the condition fails to improve as anticipated.  Rae Lips, LCSW     07/20/2022   10:01 AM 11/05/2020    1:43 PM 02/09/2019    9:50 AM 12/27/2018    1:48 PM  Depression screen PHQ 2/9  Decreased Interest 0 1 0 1  Down, Depressed, Hopeless 0 1 0 1  PHQ - 2 Score 0 2 0 2  Altered sleeping 1 1 1  0  Tired, decreased energy 1 1 1 1   Change in appetite 2 0 0 2  Feeling bad or failure about yourself  0 1 0 1  Trouble concentrating 1 1 2  0  Moving slowly or fidgety/restless 0 0 3 1  Suicidal thoughts 0 0 0 0  PHQ-9 Score 5 6 7 7   Difficult doing work/chores   Somewhat difficult       07/20/2022   10:01 AM 11/05/2020    1:43 PM  GAD 7 : Generalized Anxiety Score  Nervous, Anxious, on Edge 1 1  Control/stop worrying 2 1  Worry too much - different things 1 1  Trouble relaxing 1 1  Restless 0 1  Easily annoyed or irritable 2 2  Afraid - awful might happen 1 1  Total GAD 7 Score 8 8

## 2023-06-03 ENCOUNTER — Ambulatory Visit: Payer: MEDICAID | Admitting: Clinical

## 2023-06-03 DIAGNOSIS — F909 Attention-deficit hyperactivity disorder, unspecified type: Secondary | ICD-10-CM

## 2023-06-03 DIAGNOSIS — F4312 Post-traumatic stress disorder, chronic: Secondary | ICD-10-CM

## 2023-06-03 DIAGNOSIS — F840 Autistic disorder: Secondary | ICD-10-CM

## 2023-06-03 DIAGNOSIS — F430 Acute stress reaction: Secondary | ICD-10-CM

## 2023-06-03 NOTE — Patient Instructions (Signed)
 Center for [facility] [address] [place], [place] [address] [phone] (main office) [phone] (Tiyanna Larcom's office)  [email or web address]
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
## 2023-06-17 ENCOUNTER — Ambulatory Visit: Payer: MEDICAID | Admitting: Clinical

## 2023-06-17 DIAGNOSIS — F909 Attention-deficit hyperactivity disorder, unspecified type: Secondary | ICD-10-CM

## 2023-06-17 DIAGNOSIS — F840 Autistic disorder: Secondary | ICD-10-CM | POA: Diagnosis not present

## 2023-06-17 DIAGNOSIS — F4312 Post-traumatic stress disorder, chronic: Secondary | ICD-10-CM

## 2023-06-17 DIAGNOSIS — Z658 Other specified problems related to psychosocial circumstances: Secondary | ICD-10-CM

## 2023-06-24 ENCOUNTER — Emergency Department (HOSPITAL_COMMUNITY)
Admission: EM | Admit: 2023-06-24 | Discharge: 2023-06-24 | Disposition: A | Discharge status: Home / Self Care | Payer: MEDICAID | Attending: Emergency Medicine | Admitting: Emergency Medicine

## 2023-06-24 ENCOUNTER — Encounter (HOSPITAL_COMMUNITY): Payer: Self-pay

## 2023-06-24 ENCOUNTER — Emergency Department (HOSPITAL_COMMUNITY): Payer: MEDICAID

## 2023-06-24 ENCOUNTER — Other Ambulatory Visit: Payer: Self-pay

## 2023-06-24 DIAGNOSIS — Z91040 Latex allergy status: Secondary | ICD-10-CM | POA: Insufficient documentation

## 2023-06-24 DIAGNOSIS — R1031 Right lower quadrant pain: Secondary | ICD-10-CM | POA: Diagnosis present

## 2023-06-24 DIAGNOSIS — R197 Diarrhea, unspecified: Secondary | ICD-10-CM | POA: Insufficient documentation

## 2023-06-24 DIAGNOSIS — J45909 Unspecified asthma, uncomplicated: Secondary | ICD-10-CM | POA: Diagnosis not present

## 2023-06-24 DIAGNOSIS — R509 Fever, unspecified: Secondary | ICD-10-CM | POA: Insufficient documentation

## 2023-06-24 DIAGNOSIS — R112 Nausea with vomiting, unspecified: Secondary | ICD-10-CM | POA: Insufficient documentation

## 2023-06-24 DIAGNOSIS — R109 Unspecified abdominal pain: Secondary | ICD-10-CM

## 2023-06-24 DIAGNOSIS — M791 Myalgia, unspecified site: Secondary | ICD-10-CM | POA: Insufficient documentation

## 2023-06-24 LAB — COMPREHENSIVE METABOLIC PANEL WITH GFR
ALT: 15 U/L (ref 0–44)
AST: 14 U/L — ABNORMAL LOW (ref 15–41)
Albumin: 4.1 g/dL (ref 3.5–5.0)
Alkaline Phosphatase: 52 U/L (ref 38–126)
Anion gap: 11 (ref 5–15)
BUN: 14 mg/dL (ref 6–20)
CO2: 22 mmol/L (ref 22–32)
Calcium: 9.5 mg/dL (ref 8.9–10.3)
Chloride: 103 mmol/L (ref 98–111)
Creatinine, Ser: 0.75 mg/dL (ref 0.44–1.00)
GFR, Estimated: >60 mL/min (ref >60)
Glucose, Bld: 104 mg/dL — ABNORMAL HIGH (ref 70–99)
Potassium: 3.6 mmol/L (ref 3.5–5.1)
Sodium: 136 mmol/L (ref 135–145)
Total Bilirubin: 0.6 mg/dL (ref 0.0–1.2)
Total Protein: 7.7 g/dL (ref 6.5–8.1)

## 2023-06-24 LAB — CBC WITH DIFFERENTIAL/PLATELET
Abs Immature Granulocytes: 0.07 10*3/uL (ref 0.00–0.07)
Basophils Absolute: 0 10*3/uL (ref 0.0–0.1)
Basophils Relative: 0 %
Eosinophils Absolute: 0 10*3/uL (ref 0.0–0.5)
Eosinophils Relative: 0 %
HCT: 38.3 % (ref 36.0–46.0)
Hemoglobin: 12.1 g/dL (ref 12.0–15.0)
Immature Granulocytes: 1 %
Lymphocytes Relative: 10 %
Lymphs Abs: 1.4 10*3/uL (ref 0.7–4.0)
MCH: 26.4 pg (ref 26.0–34.0)
MCHC: 31.6 g/dL (ref 30.0–36.0)
MCV: 83.6 fL (ref 80.0–100.0)
Monocytes Absolute: 1 10*3/uL (ref 0.1–1.0)
Monocytes Relative: 7 %
Neutro Abs: 11.5 10*3/uL — ABNORMAL HIGH (ref 1.7–7.7)
Neutrophils Relative %: 82 %
Platelets: 261 10*3/uL (ref 150–400)
RBC: 4.58 MIL/uL (ref 3.87–5.11)
RDW: 15.2 % (ref 11.5–15.5)
WBC: 14 10*3/uL — ABNORMAL HIGH (ref 4.0–10.5)
nRBC: 0 % (ref 0.0–0.2)

## 2023-06-24 LAB — URINALYSIS, ROUTINE W REFLEX MICROSCOPIC
Bacteria, UA: NONE SEEN
Bilirubin Urine: NEGATIVE
Glucose, UA: NEGATIVE mg/dL
Ketones, ur: NEGATIVE mg/dL
Leukocytes,Ua: NEGATIVE
Nitrite: NEGATIVE
Protein, ur: NEGATIVE mg/dL
Specific Gravity, Urine: 1.02 (ref 1.005–1.030)
pH: 5 (ref 5.0–8.0)

## 2023-06-24 LAB — PREGNANCY, URINE: Preg Test, Ur: NEGATIVE

## 2023-06-24 MED ORDER — ONDANSETRON 4 MG PO TBDP
4.0000 mg | ORAL_TABLET | Freq: Four times a day (QID) | ORAL | 0 refills | Status: AC | PRN
Start: 2023-06-24 — End: ?

## 2023-06-24 MED ORDER — DICYCLOMINE HCL 20 MG PO TABS: 20.0000 mg | ORAL_TABLET | Freq: Two times a day (BID) | ORAL | 0 refills | Status: DC

## 2023-06-24 MED ORDER — MORPHINE SULFATE (PF) 4 MG/ML IV SOLN
4.0000 mg | Freq: Once | INTRAVENOUS | Status: DC
  Filled 2023-06-24: qty 1

## 2023-06-24 MED ORDER — ONDANSETRON HCL 4 MG/2ML IJ SOLN
4.0000 mg | Freq: Once | INTRAMUSCULAR | Status: AC
  Administered 2023-06-24: 4 mg via INTRAVENOUS
  Filled 2023-06-24: qty 2

## 2023-06-24 MED ORDER — IOHEXOL 300 MG/ML  SOLN
100.0000 mL | Freq: Once | INTRAMUSCULAR | Status: AC | PRN
  Administered 2023-06-24: 100 mL via INTRAVENOUS

## 2023-06-24 NOTE — ED Triage Notes (Signed)
 Pt arrived via [facility] from home c/o RLQ abdominal pain that began this morning. Pt reports eating red meat yesterday, and Pt reports being bit by a tick a few days ago as well and is concerned for Alpha Gal. Pt endorses emesis.

## 2023-06-24 NOTE — Discharge Instructions (Addendum)
 Was a pleasure taking care of you today.  You evaluated the ER for abdominal pain with nausea vomiting diarrhea.  Your white blood cell count was slightly elevated, there is no sign of urinary tract infection, your CT scan did not show any abnormalities, specifically no sign of appendicitis.  We are glad you are feeling better.  The symptoms could be due to a stomach virus, we are going to prescribe nausea medicine and a medicine to help with stomach cramping.  As discussed, sometimes early appendicitis is missed on CT scans if you have worsening symptoms you need to come back to the ER.  Otherwise follow-up close with your PCP.

## 2023-06-24 NOTE — ED Provider Notes (Signed)
 [place] EMERGENCY DEPARTMENT AT [facility] Provider Note   CSN: [id number] Arrival date & time: 06/24/23  1049     History  Chief Complaint  Patient presents with   Abdominal Pain    Audrey Matthews is a 23 y.o. female.  She has PMH of depression, asthma.  Presents to ER for right lower quadrant abdominal pain.  States she woke up around 5 AM with temperature of 101 Fahrenheit, nausea, body aches and chills.  Over the next couple of hours developed sharp stabbing right lower quadrant abdominal pain.  She is having vomiting and diarrhea since this started.  Denies urinary symptoms, no hematemesis or hematochezia.  She is never had similar symptoms in the past.  She is past surgical history of bilateral salpingectomy   Abdominal Pain      Home Medications Prior to Admission medications   Medication Sig Start Date End Date Taking? Authorizing Provider  dicyclomine (BENTYL) 20 MG tablet Take 1 tablet (20 mg total) by mouth 2 (two) times daily. 06/24/23  Yes Brandonn Capelli A, PA-C  ondansetron (ZOFRAN-ODT) 4 MG disintegrating tablet Take 1 tablet (4 mg total) by mouth every 6 (six) hours as needed for nausea or vomiting. 06/24/23  Yes Katherleen Folkes A, PA-C  acetaminophen (TYLENOL) 500 MG tablet Take 500 mg by mouth every 6 (six) hours as needed for mild pain or headache.    [provider]  cyclobenzaprine (FLEXERIL) 10 MG tablet Take 1 tablet (10 mg total) by mouth 2 (two) times daily as needed for muscle spasms. 11/08/22   Brand Males, CNM  famotidine (PEPCID) 20 MG tablet TAKE ONE TABLET TWICE DAILY Patient taking differently: Take 20 mg by mouth 2 (two) times daily as needed for heartburn. 11/03/22   Cheral Marker, CNM  ferrous sulfate 325 (65 FE) MG EC tablet Take 1 tablet (325 mg total) by mouth every other day. Patient not taking: Reported on 02/15/2023 11/05/22 11/05/23  Celedonio Savage, MD  ibuprofen (ADVIL) 600 MG tablet Take 1 tablet (600 mg  total) by mouth every 6 (six) hours. 01/05/23   Constant, Peggy, MD  oxyCODONE-acetaminophen (PERCOCET/ROXICET) 5-325 MG tablet Take 1 tablet by mouth every 6 (six) hours as needed. Patient not taking: Reported on 02/15/2023 01/05/23   Constant, Peggy, MD  Prenatal Vit-Fe Fumarate-FA (PRENATAL PO) Take 1 tablet by mouth daily. Patient not taking: Reported on 02/15/2023    [provider]      Allergies    Latex    Review of Systems   Review of Systems  Gastrointestinal:  Positive for abdominal pain.    Physical Exam Updated Vital Signs BP 125/73   Pulse 70   Temp 98.8 F (37.1 C) (Oral)   Resp 16   Ht 5\' 1"  (1.549 m)   Wt 74 kg   LMP 06/24/2023 (Exact Date)   SpO2 98%   BMI 30.83 kg/m  Physical Exam Vitals and nursing note reviewed.  Constitutional:      General: She is not in acute distress.    Appearance: She is well-developed.  HENT:     Head: Normocephalic and atraumatic.  Eyes:     Conjunctiva/sclera: Conjunctivae normal.  Cardiovascular:     Rate and Rhythm: Normal rate and regular rhythm.     Heart sounds: No murmur heard. Pulmonary:     Effort: Pulmonary effort is normal. No respiratory distress.     Breath sounds: Normal breath sounds.  Abdominal:  Palpations: Abdomen is soft.     Tenderness: There is abdominal tenderness in the right lower quadrant.  Musculoskeletal:        General: No swelling.     Cervical back: Neck supple.  Skin:    General: Skin is warm and dry.     Capillary Refill: Capillary refill takes less than 2 seconds.  Neurological:     Mental Status: She is alert.  Psychiatric:        Mood and Affect: Mood normal.     ED Results / Procedures / Treatments   Labs (all labs ordered are listed, but only abnormal results are displayed) Labs Reviewed  CBC WITH DIFFERENTIAL/PLATELET - Abnormal; Notable for the following components:      Result Value   WBC 14.0 (*)    Neutro Abs 11.5 (*)    All other components within  normal limits  COMPREHENSIVE METABOLIC PANEL WITH GFR - Abnormal; Notable for the following components:   Glucose, Bld 104 (*)    AST 14 (*)    All other components within normal limits  URINALYSIS, ROUTINE W REFLEX MICROSCOPIC - Abnormal; Notable for the following components:   APPearance HAZY (*)    Hgb urine dipstick MODERATE (*)    All other components within normal limits  PREGNANCY, URINE    EKG None  Radiology CT ABDOMEN PELVIS W CONTRAST Result Date: 06/24/2023 CLINICAL DATA:  Right lower quadrant pain. EXAM: CT ABDOMEN AND PELVIS WITH CONTRAST TECHNIQUE: Multidetector CT imaging of the abdomen and pelvis was performed using the standard protocol following bolus administration of intravenous contrast. RADIATION DOSE REDUCTION: This exam was performed according to the departmental dose-optimization program which includes automated exposure control, adjustment of the mA and/or kV according to patient size and/or use of iterative reconstruction technique. CONTRAST:  OMNIPAQUE IOHEXOL 300 MG/ML  SOLN COMPARISON:  11/24/2019 FINDINGS: Lower Chest: No acute findings. Hepatobiliary: No suspicious hepatic masses identified. Gallbladder is unremarkable. No evidence of biliary ductal dilatation. Pancreas:  No mass or inflammatory changes. Spleen: Within normal limits in size and appearance. Adrenals/Urinary Tract: No suspicious masses identified. No evidence of ureteral calculi or hydronephrosis. Stomach/Bowel: No evidence of obstruction, inflammatory process or abnormal fluid collections. Normal appendix visualized. Vascular/Lymphatic: No pathologically enlarged lymph nodes. No acute vascular findings. Reproductive:  No mass or other significant abnormality. Other:  None. Musculoskeletal:  No suspicious bone lesions identified. IMPRESSION: Negative. No radiographic evidence of appendicitis or other significant abnormality. Electronically Signed   By: Danae Orleans M.D.   On: 06/24/2023 15:12     Procedures Procedures    Medications Ordered in ED Medications  morphine (PF) 4 MG/ML injection 4 mg (4 mg Intravenous Patient Refused/Not Given 06/24/23 1300)  ondansetron (ZOFRAN) injection 4 mg (4 mg Intravenous Given 06/24/23 1257)  iohexol (OMNIPAQUE) 300 MG/ML solution 100 mL (100 mLs Intravenous Contrast Given 06/24/23 1412)    ED Course/ Medical Decision Making/ A&P                                 Medical Decision Making This patient presents to the ED for concern of vomiting diarrhea right lower quadrant abdominal pain, this involves an extensive number of treatment options, and is a complaint that carries with it a high risk of complications and morbidity.  The differential diagnosis includes appendicitis, gastritis, gastroenteritis, ovarian cyst, ectopic pregnancy, ovarian torsion, UTI, other     Additional history obtained:  Additional history obtained from EMR External records from outside source obtained and reviewed including prior notes and labs   Lab Tests:  I Ordered, and personally interpreted labs.  The pertinent results include: Blood cell count 14   Imaging Studies ordered:  I ordered imaging studies including CT abdomen pelvis which shows no acute findings, typically no appendicitis, no mass or abnormality of the ovaries I independently visualized and interpreted imaging within scope of identifying emergent findings  I agree with the radiologist interpretation      Problem List / ED Course / Critical interventions / Medication management  Here with abdominal pain right lower quadrant with nausea vomiting diarrhea, had fever at home, she has a leukocytosis but no UTI, no other significant laboratory abnormalities, given her pain right lower quadrant tenderness, CT ordered to rule out possible appendicitis, also considering ovarian torsion, ovarian cysts or ectopic pregnancy.  Patient is not pregnant, CT is normal, she is feeling better and tolerating  p.o. after Zofran.  She declined the morphine.  Discussed with patient this could be due to gastroenteritis though early appendicitis is still in the differential and she is to watch her symptoms closely at home, sent home with prescription for Zofran and Bentyl and given strict follow-up and return precautions. I have reviewed the patients home medicines and have made adjustments as needed   Amount and/or Complexity of Data Reviewed Labs: ordered. Radiology: ordered.  Risk Prescription drug management.           Final Clinical Impression(s) / ED Diagnoses Final diagnoses:  Abdominal pain, unspecified abdominal location  Nausea vomiting and diarrhea    Rx / DC Orders ED Discharge Orders          Ordered    ondansetron (ZOFRAN-ODT) 4 MG disintegrating tablet  Every 6 hours PRN        06/24/23 1555    dicyclomine (BENTYL) 20 MG tablet  2 times daily        06/24/23 [address], [place] A, PA-C 06/24/23 1901    Terrilee Files, MD 06/25/23 [phone]
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
## 2023-07-13 ENCOUNTER — Encounter: Payer: Self-pay | Admitting: Women's Health

## 2023-07-19 ENCOUNTER — Ambulatory Visit: Payer: MEDICAID | Admitting: Clinical

## 2023-07-19 DIAGNOSIS — F840 Autistic disorder: Secondary | ICD-10-CM | POA: Diagnosis not present

## 2023-07-19 DIAGNOSIS — F909 Attention-deficit hyperactivity disorder, unspecified type: Secondary | ICD-10-CM

## 2023-07-19 DIAGNOSIS — Z658 Other specified problems related to psychosocial circumstances: Secondary | ICD-10-CM

## 2023-07-19 DIAGNOSIS — F4312 Post-traumatic stress disorder, chronic: Secondary | ICD-10-CM

## 2023-08-05 NOTE — BH Specialist Note (Unsigned)
 Integrated Behavioral Health via Telemedicine Visit  08/17/2023 Audrey Matthews [id number]  Number of Integrated Behavioral Health Clinician visits: Additional Visit  Session Start time: 1045   Session End time: 1113  Total time in minutes: 28  Referring Provider: Glinda Lapping, CNM Patient/Family location: Home [facility] Provider location: [facility]  All persons participating in visit: [name] and [facility] Audrey Matthews   Types of Service: Individual psychotherapy and Video visit  I connected with Azya Resnick and/or Reanna Furio's n/a via  Telephone or Video Enabled Telemedicine Application  (Video is Caregility application) and verified that I am speaking with the correct person using two identifiers. Discussed confidentiality: Yes   I discussed the limitations of telemedicine and the availability of in person appointments.  Discussed there is a possibility of technology failure and discussed alternative modes of communication if that failure occurs.  I discussed that engaging in this telemedicine visit, they consent to the provision of behavioral healthcare and the services will be billed under their insurance.  Patient and/or legal guardian expressed understanding and consented to Telemedicine visit: Yes   Presenting Concerns: Patient and/or family reports the following symptoms/concerns: Discouraging to begin having seizures in her sleep again, last time was 2-3 days ago, as it will delay and/or prevent obtaining driver's license. Pt continues to have emotional "episodes" that have been managed by self-coping strategies daily. Duration of problem: Ongoing; Severity of problem: moderate  Patient and/or Family's Strengths/Protective Factors: Social connections, Concrete supports in place (healthy food, safe environments, etc.), Sense of purpose, and Physical Health (exercise, healthy diet, medication compliance,  etc.)  Goals Addressed: Patient will:  Reduce symptoms of: anxiety, depression, and stress   Increase knowledge and/or ability of: healthy habits   Demonstrate ability to: Increase healthy adjustment to current life circumstances and Increase motivation to adhere to plan of care  Progress towards Goals: Ongoing  Interventions: Interventions utilized:  Motivational Interviewing and Supportive Reflection Standardized Assessments completed: Not Needed  Patient and/or Family Response: Patient agrees with treatment plan.   Assessment: Patient currently experiencing Autism spectrum disorder, PTSD; ADHD; Psychosocial stress.   Patient may benefit from continued therapeutic intervention .  Plan: Follow up with behavioral health clinician on : Three weeks Behavioral recommendations:  -Begin resuming maintaining daily routines and healthy self-care, to minimize escalation of anxiety/panic and stress reactions -Call PCP today to schedule appointment; request referral to neurology (as previously recommended)  -Continue daily self-coping strategies, including healthy humor with husband, to cope with life stressors -Continue plan to request name change via clerk of court -Continue plan to apply for SSI -Continue plan to establish with couple counselor of choice Referral(s): Integrated [employer] (In Clinic)  I discussed the assessment and treatment plan with the patient and/or parent/guardian. They were provided an opportunity to ask questions and all were answered. They agreed with the plan and demonstrated an understanding of the instructions.   They were advised to call back or seek an in-person evaluation if the symptoms worsen or if the condition fails to improve as anticipated.  Georgia Kipper, LCSW     07/19/2023    4:13 PM 07/20/2022   10:01 AM 11/05/2020    1:43 PM 02/09/2019    9:50 AM 12/27/2018    1:48 PM  Depression screen PHQ 2/9  Decreased Interest 1 0 1  0 1  Down, Depressed, Hopeless 0 0 1 0 1  PHQ - 2 Score 1 0  2 0 2  Altered sleeping 1 1 1 1  0  Tired, decreased energy 1 1 1 1 1   Change in appetite 1 2 0 0 2  Feeling bad or failure about yourself  1 0 1 0 1  Trouble concentrating 1 1 1 2  0  Moving slowly or fidgety/restless 0 0 0 3 1  Suicidal thoughts 0 0 0 0 0  PHQ-9 Score 6 5 6 7 7   Difficult doing work/chores    Somewhat difficult       07/19/2023    4:15 PM 07/20/2022   10:01 AM 11/05/2020    1:43 PM  GAD 7 : Generalized Anxiety Score  Nervous, Anxious, on Edge 1 1 1   Control/stop worrying 1 2 1   Worry too much - different things 1 1 1   Trouble relaxing 1 1 1   Restless 0 0 1  Easily annoyed or irritable 1 2 2   Afraid - awful might happen 0 1 1  Total GAD 7 Score 5 8 8

## 2023-08-17 ENCOUNTER — Ambulatory Visit: Payer: MEDICAID | Admitting: Clinical

## 2023-08-17 DIAGNOSIS — Z658 Other specified problems related to psychosocial circumstances: Secondary | ICD-10-CM

## 2023-08-17 DIAGNOSIS — F840 Autistic disorder: Secondary | ICD-10-CM

## 2023-08-17 DIAGNOSIS — F4312 Post-traumatic stress disorder, chronic: Secondary | ICD-10-CM

## 2023-08-17 DIAGNOSIS — F909 Attention-deficit hyperactivity disorder, unspecified type: Secondary | ICD-10-CM

## 2023-08-18 NOTE — Patient Instructions (Signed)
 [facility] for [facility] at [facility] [address] [place], [place] [address] [phone] (main office) [phone] (Konner Saiz's office)

## 2023-09-08 ENCOUNTER — Ambulatory Visit: Payer: MEDICAID | Admitting: Clinical

## 2023-09-08 DIAGNOSIS — F4312 Post-traumatic stress disorder, chronic: Secondary | ICD-10-CM

## 2023-09-08 DIAGNOSIS — Z658 Other specified problems related to psychosocial circumstances: Secondary | ICD-10-CM

## 2023-09-08 DIAGNOSIS — F840 Autistic disorder: Secondary | ICD-10-CM

## 2023-09-08 DIAGNOSIS — F909 Attention-deficit hyperactivity disorder, unspecified type: Secondary | ICD-10-CM
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
## 2023-09-09 NOTE — Patient Instructions (Signed)

## 2023-09-20 ENCOUNTER — Encounter: Payer: Self-pay | Admitting: Women's Health

## 2023-10-08 ENCOUNTER — Encounter: Payer: Self-pay | Admitting: Genetic Counselor

## 2023-10-13 ENCOUNTER — Encounter: Payer: Self-pay | Admitting: Genetic Counselor

## 2023-10-13 ENCOUNTER — Inpatient Hospital Stay: Payer: MEDICAID | Attending: Genetic Counselor

## 2023-10-13 ENCOUNTER — Inpatient Hospital Stay (HOSPITAL_BASED_OUTPATIENT_CLINIC_OR_DEPARTMENT_OTHER): Payer: MEDICAID | Admitting: Genetic Counselor

## 2023-10-13 ENCOUNTER — Other Ambulatory Visit: Payer: Self-pay | Admitting: Genetic Counselor

## 2023-10-13 DIAGNOSIS — Z800 Family history of malignant neoplasm of digestive organs: Secondary | ICD-10-CM | POA: Insufficient documentation

## 2023-10-13 DIAGNOSIS — Z803 Family history of malignant neoplasm of breast: Secondary | ICD-10-CM | POA: Insufficient documentation

## 2023-10-13 DIAGNOSIS — Z1379 Encounter for other screening for genetic and chromosomal anomalies: Secondary | ICD-10-CM | POA: Diagnosis present

## 2023-10-13 LAB — GENETIC SCREENING ORDER

## 2023-10-13 NOTE — Progress Notes (Signed)
 REFERRING PROVIDER: Jayne Vonn DEL, MD [address] [place],  [place] [address]  PRIMARY PROVIDER:  Toribio Jerel MATSU, MD  PRIMARY REASON FOR VISIT:  1. Family history of breast cancer   2. Family history of pancreatic cancer      HISTORY OF PRESENT ILLNESS:   Ms. Kalmar, a 23 y.o. female, was seen for a [facility] cancer genetics consultation at the request of Dr. Jayne due to a family history of cancer and a known BRCA2 mutation.  Ms. Sheldon presents to clinic today to discuss the possibility of a hereditary predisposition to cancer, genetic testing, and to further clarify her future cancer risks, as well as potential cancer risks for family members.   Ms. Sawhney is a 23 y.o. female with no personal history of cancer.    CANCER HISTORY:  Oncology History   No history exists.     RISK FACTORS:  Menarche was at age 86.  First live birth at age 61.  OCP use for approximately 2 years.  Ovaries intact: yes.  Hysterectomy: no.  Menopausal status: perimenopausal.  HRT use: 0 years. Colonoscopy: no; not examined. Mammogram within the last year: no. Number of breast biopsies: 0. Up to date with pelvic exams: yes. 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   On adderall prior to pregnancy, stopped w/ +PT, doing well   Autism spectrum disorder 12/25/2010   Borderline personality disorder (HCC) 06/03/2018   Chronic left-sided low back pain without sciatica 02/06/2022   Chronic migraine with aura 09/27/2019   Chronic post-traumatic stress disorder 12/25/2010   Depression with anxiety 10/03/2015   07/20/22 No meds, doing well, [facility] referral ordered   Episode of shaking 09/27/2019   Family history of pancreatic cancer 10/13/2023   Major depressive  disorder 04/13/2017   PTSD (post-traumatic stress disorder)    Syncopal episodes     Past Surgical History:  Procedure Laterality Date   CAUTERIZE INNER NOSE     CERVICAL CERCLAGE N/A 04/14/2019   Procedure: CERCLAGE CERVICAL;  Surgeon: Arna Ranks, MD;  Location: [facility];  Service: Gynecology;  Laterality: N/A;   DENTAL SURGERY     MOUTH SURGERY     TUBAL LIGATION N/A 01/04/2023   Procedure: POST PARTUM TUBAL LIGATION;  Surgeon: Barbra Lang PARAS, DO;  Location: [facility];  Service: Gynecology;  Laterality: N/A;   WISDOM TOOTH EXTRACTION      Social History   Socioeconomic History   Marital status: Married    Spouse name: Not on file   Number of children: 2   Years of education: Not on file   Highest education level: Not on file  Occupational History   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   She has  one daughter - born March 2021 (three month premature).   Right-handed.   No daily caffeine use.   Social Drivers of Corporate investment banker Strain: Low Risk  (07/20/2022)   Overall Financial Resource Strain (CARDIA)    Difficulty of Paying Living Expenses: Not hard at all  Food Insecurity: No Food Insecurity (01/03/2023)   Hunger Vital Sign    Worried About Running Out of Food in the Last Year: Never true    Ran Out of Food in the Last Year: Never true  Transportation Needs: No Transportation Needs (01/03/2023)   PRAPARE - Administrator, Civil Service (Medical): No    Lack of Transportation (Non-Medical): No  Physical  Activity: Insufficiently Active (07/20/2022)   Exercise Vital Sign    Days of Exercise per Week: 2 days    Minutes of Exercise per Session: 20 min  Stress: Stress Concern Present (07/20/2022)   [employer] of Occupational Health - Occupational Stress Questionnaire    Feeling of Stress : To some extent  Social Connections: Moderately Integrated (07/20/2022)   Social Connection and Isolation Panel    Frequency of Communication with Friends and Family: More than three times a week    Frequency of Social Gatherings with Friends and Family: Twice a week    Attends Religious Services: 1 to 4 times per year    Active Member of [employer] or Organizations: No    Attends Engineer, structural: Never    Marital Status: Married     FAMILY HISTORY:  We obtained a detailed, 4-generation family history.  Significant diagnoses are listed below: Family History  Problem Relation Age of Onset   Depression Mother    Anxiety disorder Mother    ADD / ADHD Mother    Cancer Mother        Anal cancer   Drug abuse Father    Heart disease Father    Leukemia Father    Depression Sister    Pancreatic cancer Paternal Uncle    Depression Maternal Grandmother    Breast cancer Maternal Grandmother 46       d. 50   Jaundice Daughter      The patient has two daughters who are cancer free.  She has two sisters who are cancer free. Both parents are living.  The patient's mother was diagnosed with anal cancer and was found to have a pathogenic BRCA2 mutation.  She has a paternal half brother who is cancer free.  The maternal grandparents are deceased.  The grandmother had breast cancer at 34.  The patient's father has leukemia.  He had two brothers, one ha pancreatic cancer.  The paternal grandfather has lung cancer.  Ms. Egle is aware of previous family history of genetic testing for hereditary cancer risks. There is no reported Ashkenazi Jewish ancestry. There is no known consanguinity.  GENETIC  COUNSELING ASSESSMENT: Ms. Shedden is a 23 y.o. female with a family history of cancer which is somewhat suggestive of a hereditary cancer syndrome and predisposition to cancer given the known BRCA2 mutation. We, therefore, discussed and recommended the following at today's visit.   DISCUSSION: We discussed that, in general, most cancer is not inherited in families, but instead is sporadic or familial. Sporadic cancers occur by chance and typically happen at older ages (>50 years) as this type of cancer is caused by genetic changes acquired during an individual's lifetime. Some families have more cancers than would be expected by chance; however, the  ages or types of cancer are not consistent with a known genetic mutation or known genetic mutations have been ruled out. This type of familial cancer is thought to be due to a combination of multiple genetic, environmental, hormonal, and lifestyle factors. While this combination of factors likely increases the risk of cancer, the exact source of this risk is not currently identifiable or testable.  We discussed that 5 - 10% of breast cancer is hereditary, with most cases associated with BRCA mutations.  The patient's mother was found to have a BRCA2 mutation based on her family history of breast cancer.  Ms. Nugent has a 50% chance of having this same mutation.  We discussed the paternal history of pancreatic cancer. About 15% of pancreatic cancer cases are associated with hereditary causes.  We could consider additional genetic testing due to concerns for pancreatic cancer  We discussed that testing is beneficial for several reasons including knowing how to follow individuals after completing their treatment and understand if other family members could be at risk for cancer and allow them to undergo genetic testing.   We reviewed the characteristics, features and inheritance patterns of hereditary cancer syndromes. We also discussed genetic testing, including the  appropriate family members to test, the process of testing, insurance coverage and turn-around-time for results. We discussed the implications of a negative, positive, carrier and/or variant of uncertain significant result. Ms. [place]  was offered family variant testing for the known mutation in Ms. Chery's mother, a common hereditary cancer panel (36+ genes) and an expanded pan-cancer panel (70+ genes). Ms. Chou was informed of the benefits and limitations of each panel, including that expanded pan-cancer panels contain genes that do not have clear management guidelines at this point in time.  We also discussed that as the number of genes included on a panel increases, the chances of variants of uncertain significance increases. Ms. Ephraim decided to pursue genetic testing for the single mutation in BRCA2.   Based on Ms. Karner's family history of cancer, she meets medical criteria for genetic testing.   We discussed that some people do not want to undergo genetic testing due to fear of genetic discrimination.  The Genetic Information Nondiscrimination Act (GINA) was signed into federal law in 2008. GINA prohibits health insurers and most employers from discriminating against individuals based on genetic information (including the results of genetic tests and family history information). According to GINA, health insurance companies cannot consider genetic information to be a preexisting condition, nor can they use it to make decisions regarding coverage or rates. GINA also makes it illegal for most employers to use genetic information in making decisions about hiring, firing, promotion, or terms of employment. It is important to note that GINA does not offer protections for life insurance, disability insurance, or long-term care insurance. GINA does not apply to those in the [employer], those who work for companies with less than 15 employees, and new life insurance or long-term disability insurance policies.   Health status due to a cancer diagnosis is not protected under GINA. More information about GINA can be found by visiting [employer].  PLAN: After considering the risks, benefits, and limitations, Ms. Knoedler provided informed consent to pursue genetic testing and the blood sample was sent to [facility] for analysis of the family variant. Results should be available within approximately 2-3 weeks' time, at which point they will be disclosed by telephone to Ms. Maston, as will any additional recommendations warranted by these results. Ms. Streiff  will receive a summary of her genetic counseling visit and a copy of her results once available. This information will also be available in Epic.   Lastly, we encouraged Ms. Kuznia to remain in contact with cancer genetics annually so that we can continuously update the family history and inform her of any changes in cancer genetics and testing that may be of benefit for this family.   Ms. Mazo questions were answered to her satisfaction today. Our contact information was provided should additional questions or concerns arise. Thank you for the referral and allowing us  to share in the care of your patient.   Deloras Reichard P. Perri, MS, CGC Licensed, Patent attorney [email or web address] phone: [phone]  In total, 60 minutes were spent on the date of the encounter in service to the patient including preparation, face-to-face consultation, documentation and care coordination.  The patient brought her husband. Drs. Lanny Stalls, and/or Gudena were available for questions, if needed..    _______________________________________________________________________ For Office Staff:  Number of people involved in session: 2 Was an Intern/ student involved with case: no

## 2023-10-15 ENCOUNTER — Telehealth: Payer: Self-pay | Admitting: Clinical

## 2023-10-15 NOTE — Telephone Encounter (Signed)
 Attempt to reschedule; Left HIPPA-compliant message to call back Warren from [employer] for [employer] at [facility] at  [phone] [facility] office).

## 2023-10-17 ENCOUNTER — Encounter (HOSPITAL_COMMUNITY): Payer: Self-pay | Admitting: Emergency Medicine

## 2023-10-17 ENCOUNTER — Emergency Department (HOSPITAL_COMMUNITY): Payer: MEDICAID

## 2023-10-17 ENCOUNTER — Emergency Department (HOSPITAL_COMMUNITY)
Admission: EM | Admit: 2023-10-17 | Discharge: 2023-10-17 | Disposition: A | Discharge status: Home / Self Care | Payer: MEDICAID

## 2023-10-17 ENCOUNTER — Other Ambulatory Visit: Payer: Self-pay

## 2023-10-17 DIAGNOSIS — Z91040 Latex allergy status: Secondary | ICD-10-CM | POA: Diagnosis not present

## 2023-10-17 DIAGNOSIS — R110 Nausea: Secondary | ICD-10-CM | POA: Insufficient documentation

## 2023-10-17 DIAGNOSIS — R102 Pelvic and perineal pain: Secondary | ICD-10-CM | POA: Insufficient documentation

## 2023-10-17 LAB — CBC WITH DIFFERENTIAL/PLATELET
Abs Immature Granulocytes: 0.03 10*3/uL (ref 0.00–0.07)
Basophils Absolute: 0 10*3/uL (ref 0.0–0.1)
Basophils Relative: 0 %
Eosinophils Absolute: 0 10*3/uL (ref 0.0–0.5)
Eosinophils Relative: 0 %
HCT: 37 % (ref 36.0–46.0)
Hemoglobin: 12.3 g/dL (ref 12.0–15.0)
Immature Granulocytes: 0 %
Lymphocytes Relative: 29 %
Lymphs Abs: 2.2 10*3/uL (ref 0.7–4.0)
MCH: 27.3 pg (ref 26.0–34.0)
MCHC: 33.2 g/dL (ref 30.0–36.0)
MCV: 82.2 fL (ref 80.0–100.0)
Monocytes Absolute: 0.6 10*3/uL (ref 0.1–1.0)
Monocytes Relative: 8 %
Neutro Abs: 4.9 10*3/uL (ref 1.7–7.7)
Neutrophils Relative %: 63 %
Platelets: 300 10*3/uL (ref 150–400)
RBC: 4.5 MIL/uL (ref 3.87–5.11)
RDW: 14.8 % (ref 11.5–15.5)
WBC: 7.8 10*3/uL (ref 4.0–10.5)
nRBC: 0 % (ref 0.0–0.2)

## 2023-10-17 LAB — BASIC METABOLIC PANEL WITH GFR
Anion gap: 12 (ref 5–15)
BUN: 7 mg/dL (ref 6–20)
CO2: 19 mmol/L — ABNORMAL LOW (ref 22–32)
Calcium: 9.5 mg/dL (ref 8.9–10.3)
Chloride: 108 mmol/L (ref 98–111)
Creatinine, Ser: 0.75 mg/dL (ref 0.44–1.00)
GFR, Estimated: >60 mL/min (ref >60)
Glucose, Bld: 92 mg/dL (ref 70–99)
Potassium: 3.4 mmol/L — ABNORMAL LOW (ref 3.5–5.1)
Sodium: 139 mmol/L (ref 135–145)

## 2023-10-17 LAB — URINALYSIS, ROUTINE W REFLEX MICROSCOPIC
Bacteria, UA: NONE SEEN
Bilirubin Urine: NEGATIVE
Glucose, UA: NEGATIVE mg/dL
Hgb urine dipstick: NEGATIVE
Ketones, ur: NEGATIVE mg/dL
Leukocytes,Ua: NEGATIVE
Nitrite: NEGATIVE
Protein, ur: 30 mg/dL — AB
Specific Gravity, Urine: 1.025 (ref 1.005–1.030)
pH: 7 (ref 5.0–8.0)

## 2023-10-17 LAB — WET PREP, GENITAL
Clue Cells Wet Prep HPF POC: NONE SEEN
Sperm: NONE SEEN
Trich, Wet Prep: NONE SEEN
WBC, Wet Prep HPF POC: <10
Yeast Wet Prep HPF POC: NONE SEEN

## 2023-10-17 LAB — PREGNANCY, URINE: Preg Test, Ur: NEGATIVE

## 2023-10-17 MED ORDER — KETOROLAC TROMETHAMINE 15 MG/ML IJ SOLN
15.0000 mg | Freq: Once | INTRAMUSCULAR | Status: AC
  Administered 2023-10-17: 15 mg via INTRAMUSCULAR
  Filled 2023-10-17: qty 1

## 2023-10-17 MED ORDER — IOHEXOL 300 MG/ML  SOLN
100.0000 mL | Freq: Once | INTRAMUSCULAR | Status: AC | PRN
  Administered 2023-10-17: 100 mL via INTRAVENOUS

## 2023-10-17 NOTE — Discharge Instructions (Addendum)
 Call and follow-up with your OB/GYN.  You can use Tylenol  Motrin  as needed for pain.  Do not put anything in your vagina until your symptoms improved.

## 2023-10-17 NOTE — ED Triage Notes (Signed)
 Pt c/o vaginal pain after sex pta.

## 2023-10-17 NOTE — ED Provider Notes (Signed)
 [place] EMERGENCY DEPARTMENT AT [facility] Provider Note   CSN: [id number] Arrival date & time: 10/17/23  2001     Patient presents with: Vaginal Pain   Audrey Matthews is a 23 y.o. female.  {Add pertinent medical, surgical, social history, OB history to HPI:3087} 23 year old female presents for evaluation of vaginal pain.  Patient states that it started while she was having sex with her significant other.  She states she has had significant vaginal pain that radiates to her abdomen since.  States it is hurting to pee.  Admits to some nausea but no vomiting.  Denies any vaginal bleeding but states she has been having vaginal discharge since yesterday.  Denies any other symptoms or concerns.   Vaginal Pain Pertinent negatives include no chest pain, no abdominal pain and no shortness of breath.       Prior to Admission medications   Medication Sig Start Date End Date Taking? Authorizing Provider  acetaminophen  (TYLENOL ) 500 MG tablet Take 500 mg by mouth every 6 (six) hours as needed for mild pain or headache.    [provider]  cyclobenzaprine  (FLEXERIL ) 10 MG tablet Take 1 tablet (10 mg total) by mouth 2 (two) times daily as needed for muscle spasms. 11/08/22   Antonetta Edsel CROME, CNM  dicyclomine  (BENTYL ) 20 MG tablet Take 1 tablet (20 mg total) by mouth 2 (two) times daily. 06/24/23   Suellen Cantor A, PA-C  famotidine  (PEPCID ) 20 MG tablet TAKE ONE TABLET TWICE DAILY Patient taking differently: Take 20 mg by mouth 2 (two) times daily as needed for heartburn. 11/03/22   Kizzie Suzen SAUNDERS, CNM  ferrous sulfate  325 (65 FE) MG EC tablet Take 1 tablet (325 mg total) by mouth every other day. Patient not taking: Reported on 02/15/2023 11/05/22 11/05/23  Cresenzo, John V, MD  ibuprofen  (ADVIL ) 600 MG tablet Take 1 tablet (600 mg total) by mouth every 6 (six) hours. 01/05/23   Constant, Peggy, MD  ondansetron  (ZOFRAN -ODT) 4 MG disintegrating tablet Take 1 tablet (4 mg  total) by mouth every 6 (six) hours as needed for nausea or vomiting. 06/24/23   Suellen Cantor A, PA-C  oxyCODONE -acetaminophen  (PERCOCET/ROXICET) 5-325 MG tablet Take 1 tablet by mouth every 6 (six) hours as needed. Patient not taking: Reported on 02/15/2023 01/05/23   Constant, Peggy, MD  Prenatal Vit-Fe Fumarate-FA (PRENATAL PO) Take 1 tablet by mouth daily. Patient not taking: Reported on 02/15/2023    [provider]    Allergies: Latex    Review of Systems  Constitutional:  Negative for chills and fever.  HENT:  Negative for ear pain and sore throat.   Eyes:  Negative for pain and visual disturbance.  Respiratory:  Negative for cough and shortness of breath.   Cardiovascular:  Negative for chest pain and palpitations.  Gastrointestinal:  Negative for abdominal pain and vomiting.  Genitourinary:  Positive for vaginal discharge and vaginal pain. Negative for dysuria and hematuria.  Musculoskeletal:  Negative for arthralgias and back pain.  Skin:  Negative for color change and rash.  Neurological:  Negative for seizures and syncope.  All other systems reviewed and are negative.   Updated Vital Signs BP 121/69 (BP Location: Right Arm)   Pulse (!) 56   Temp 97.8 F (36.6 C) (Oral)   Resp 18   Ht 5' 1 (1.549 m)   Wt 74.4 kg   LMP 10/03/2023   SpO2 100%   BMI 30.99 kg/m   Physical Exam Vitals and  nursing note reviewed.  Constitutional:      General: She is not in acute distress.    Appearance: Normal appearance. She is well-developed. She is not ill-appearing.  HENT:     Head: Normocephalic and atraumatic.  Eyes:     Conjunctiva/sclera: Conjunctivae normal.  Cardiovascular:     Rate and Rhythm: Normal rate and regular rhythm.     Heart sounds: Normal heart sounds. No murmur heard. Pulmonary:     Effort: Pulmonary effort is normal. No respiratory distress.     Breath sounds: Normal breath sounds.  Abdominal:     General: Abdomen is flat.     Palpations:  Abdomen is soft.     Tenderness: There is abdominal tenderness.  Musculoskeletal:        General: No swelling.     Cervical back: Neck supple.  Skin:    General: Skin is warm and dry.     Capillary Refill: Capillary refill takes less than 2 seconds.  Neurological:     Mental Status: She is alert.  Psychiatric:        Mood and Affect: Mood normal.     (all labs ordered are listed, but only abnormal results are displayed) Labs Reviewed  WET PREP, GENITAL  URINALYSIS, ROUTINE W REFLEX MICROSCOPIC  PREGNANCY, URINE  GC/CHLAMYDIA PROBE AMP ([place]) NOT AT [facility]    EKG: None  Radiology: No results found.  {Document cardiac monitor, telemetry assessment procedure when appropriate:32947} Procedures   Medications Ordered in the ED - No data to display    {Click here for ABCD2, HEART and other calculators REFRESH Note before signing:1}                              Medical Decision Making Amount and/or Complexity of Data Reviewed Labs: ordered.   ***  {Document critical care time when appropriate  Document review of labs and clinical decision tools ie CHADS2VASC2, etc  Document your independent review of radiology images and any outside records  Document your discussion with family members, caretakers and with consultants  Document social determinants of health affecting pt's care  Document your decision making why or why not admission, treatments were needed:32947:::1}   Final diagnoses:  None    ED Discharge Orders     None

## 2023-10-19 LAB — GC/CHLAMYDIA PROBE AMP (~~LOC~~) NOT AT ARMC
Chlamydia: NEGATIVE
Comment: NEGATIVE
Comment: NORMAL
Neisseria Gonorrhea: NEGATIVE

## 2023-10-26 ENCOUNTER — Encounter: Payer: Self-pay | Admitting: Genetic Counselor

## 2023-10-26 DIAGNOSIS — Z1379 Encounter for other screening for genetic and chromosomal anomalies: Secondary | ICD-10-CM | POA: Insufficient documentation

## 2023-10-26 DIAGNOSIS — Z1501 Genetic susceptibility to malignant neoplasm of breast: Secondary | ICD-10-CM | POA: Insufficient documentation

## 2023-10-27 ENCOUNTER — Telehealth: Payer: Self-pay | Admitting: Genetic Counselor

## 2023-10-27 NOTE — Telephone Encounter (Addendum)
 Revealed that patient tested positive for the BRCA2 variant found in her mother.  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 It also could have similar BRCA2 risks. Her mother's genetic test is in the process of being amended with the new cancer risks.  Once that happens we should all sit down and have another discussion.  Patient voiced her understanding and does not have further questions at this time.

## 2023-11-08 ENCOUNTER — Telehealth: Payer: Self-pay | Admitting: Clinical

## 2023-11-09 NOTE — Telephone Encounter (Signed)
 error

## 2023-11-15 ENCOUNTER — Ambulatory Visit: Payer: Self-pay | Admitting: Genetic Counselor

## 2023-11-15 DIAGNOSIS — Z1501 Genetic susceptibility to malignant neoplasm of breast: Secondary | ICD-10-CM

## 2023-11-15 DIAGNOSIS — Z1379 Encounter for other screening for genetic and chromosomal anomalies: Secondary | ICD-10-CM

## 2023-11-15 NOTE — Progress Notes (Signed)
 GENETIC TEST RESULTS   Patient Name: Audrey Matthews Patient Age: 23 y.o. Encounter Date: 11/15/2023  Referring Provider: Vonn Inch, MD    Audrey Matthews was seen in the Cancer Genetics clinic on October 13, 2023 due to a family history of cancer and concern regarding a hereditary predisposition to cancer in the family. Please refer to the prior Genetics clinic note for more information regarding Audrey Matthews medical and family histories and our assessment at the time.   FAMILY HISTORY:  We obtained a detailed, 4-generation family history.  Significant diagnoses are listed below: Family History  Problem Relation Age of Onset   Depression Mother    Anxiety disorder Mother    ADD / ADHD Mother    Cancer Mother        Anal cancer   Drug abuse Father    Heart disease Father    Leukemia Father    Depression Sister    Pancreatic cancer Paternal Uncle    Depression Maternal Grandmother    Breast cancer Maternal Grandmother 30       d. 56   Jaundice Daughter        The patient has two daughters who are cancer free.  She has two sisters who are cancer free. Both parents are living.   The patient's mother was diagnosed with anal cancer and was found to have a pathogenic BRCA2 mutation.  She has a paternal half brother who is cancer free.  The maternal grandparents are deceased.  The grandmother had breast cancer at 63.   The patient's father has leukemia.  He had two brothers, one ha pancreatic cancer.  The paternal grandfather has lung cancer.   Audrey Matthews of previous family history of genetic testing for hereditary cancer risks. There is no reported Ashkenazi Jewish ancestry. 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    The test report has been scanned into EPIC and is located under the Molecular  Pathology section of the Results Review tab.? A portion of the result report is included below for reference. Genetic testing reported out on October 26, 2023  .    Clinical Information:  A few studies have been performed on this particular variant have conflicting information. Based on the majority of available evidence to date, this variant is likely to be pathogenic. However, because this variant was reported as having an intermediate or inconclusive impact in several functional studies, and may have been identified in one or more patients with Fanconi Anemia, it may be hypomorphic and thus, carriers of this variant and their families may present with reduced risks, and not with the typical clinical characteristics of a high-risk pathogenic BRCA2 alteration. As risk estimates are unknown at this time, clinical correlation is advised.  Hereditary breast and ovarian cancer (HBOC) syndrome, due to typical BRCA mutations, is characterized by an increased lifetime risk for, generally, adult-onset cancers including, breast, contralateral breast, female breast, ovarian, prostate, melanoma and pancreatic.   The cancers associated with BRCA2 are:   Female breast cancer, 55-69% risk  In women with a history of breast cancer, the risk for contralateral breast cancer 20 years after breast cancer diagnosis is 25%.   Female breast cancer, up to an 7% risk  Ovarian cancer, 13-29% risk  Pancreatic cancer, 5-10% risk  Prostate cancer, 19-61% risk  Melanoma, elevated risk  Management Recommendations:   Breast Screening/Risk Reduction:   Women:  Breast awareness starting at age 28  Clinical breast examination every 6-12 months starting at age 59   Breast cancer screening:  Age 23-29 years, annual breast MRI with and without contrast (or mammogram, if MRI is unavailable), although the age to initiate screening may be individualized based on family history  Age 97-75 years, annual mammogram and breast MRI with  and without contrast  Age >75 years, management should be considered on an individual basis  For women with a BRCA2 pathogenic or likely pathogenic variant who are treated for breast cancer and have not had a bilateral mastectomy, screening with annual mammogram and breast MRI should continue as described above.  The option of prophylactic bilateral risk-reducing mastectomy (RRM), removal of the breast tissue before cancer develops, is the best option for significantly decreasing the risk of developing breast cancer. Studies have shown mastectomies reduce the risk of breast cancer by 90-95% in women with a BRCA2 mutation.    Males:  Breast self-exam training and education starting at age 63 years  Annual clinical breast exam starting at age 60 years   Consider annual mammogram starting at age 58 or 10 years before the earliest known female breast cancer in the family (whichever comes first).   Gynecological Cancer Screening/Risk Reduction:  It is recommended that women with a BRCA2 mutation have a risk-reducing salpingo oophorectomy (RRSO), removal of the ovaries and fallopian tubes. It is reasonable to delay RRSO until age 37-45 years unless age at diagnosis in the family warrants earlier age for consideration of RRSO.   Having a RRSO is estimated to reduce the risk of ovarian cancer by up to 96%. There is still a small risk of developing an ovarian-like cancer in the lining of the abdomen, called the peritoneum.  Another benefit to having the ovaries removed is the risk reduction for breast cancer. If the ovaries are removed before menopause, the risk of developing breast cancer is reduced.  Ovarian cancer screening is an option for women who chose not to have a RRSO or who have not yet completed their family. Current screening methods for ovarian cancer are neither sensitive nor specific, meaning that often early stage ovarian cancer cannot be diagnosed through this screening.  Screening can also be  falsely positive with no cancer present. For this reason, RRSO is recommended over screening. If ovarian cancer screening is recommended by a physician, it could include:  CA-125 blood tests  Transvaginal ultrasounds  Clinical pelvic exams   Skin Cancer Screening and Risk Reduction:  Regular skin self-examinations  Individuals should notify their physicians of any changes to moles such as increasing in size, darkening in color, or other change in appearance.  Annual skin examinations by a dermatologist   Follow sun-safety recommendations such as:  Using UVA and UVB 30 SPF or higher sunscreen  Avoiding sunburns  Limiting sun exposure, especially during the hours of 11am-4pm   Wearing protective clothing and sunglasses  Avoid using tanning beds  For more information about the prevention of melanoma visit [email or web address]   Prostate Cancer Screening:  Annual digital rectal exam (DRE) at age 24  Annual PSA blood test at age 65   Pancreatic Cancer Screening/Risk Reduction:  Avoid smoking, heavy alcohol use, and obesity.  It has been suggested that pancreatic cancer screening be limited to those with a family history of pancreatic cancer (first- or second-degree relative). Ideally, screening should be performed in experienced centers utilizing  a multidisciplinary approach under research conditions. Recommended screening could include annual endoscopic ultrasound (preferred) and/or MRI of the pancreas starting at age 60 or 44 years younger than the earliest age diagnosis in the family.   Additional Considerations:  Individuals at risk for developing breast and ovarian cancer may benefit from the use of medication to reduce their risk for cancer. These medications are referred to as chemoprevention. For example, oral contraceptive use has been shown to reduce the risk of ovarian cancer by approximately 60% in BRCA2 mutation carriers if taken for at least 5 years. This risk reduction remains  even after discontinuation of oral contraceptives.  Recent studies have suggested PARP inhibitors may be a beneficial chemotherapeutic agent for a subset of patients with BRCA2-associated breast, ovarian, prostate, and pancreatic cancers. Clinical trials are currently in process to determine if and how these agents can be useful in the treatment of BRCA2 cancer patients  Patients of reproductive age should be made Matthews of options for prenatal diagnosis and assisted reproduction including pre-implantation genetic diagnosis.  Individuals with a single pathogenic BRCA2 variant are carriers of Fanconi anemia. Fanconi anemia is characterized by developmental delay apparent from infancy, short stature, microcephaly, and coarse dysmorphic features. For there to be a risk of Fanconi anemia in offspring, both the patient and their partner would each have to carry a pathogenic variant in BRCA2. In this case, the risk of having an affected child is 25%.    This information is based on current understanding of the gene and may change in the future.   Implications for Family Members:  Hereditary predisposition to cancer due to pathogenic variants in the BRCA2 gene has autosomal dominant inheritance. This means that an individual with a pathogenic variant has a 50% chance of passing the condition on to his/her offspring. Identification of a pathogenic variant allows for the recognition of at-risk relatives who can pursue testing for the familial variant.   In order to help with clinical correlation for this particular variant, we are testing additional family members.  Hopefully with more family members tested, we will learn more about which side of Audrey Matthews mother's family this variant is coming from.  Family members are encouraged to consider genetic testing for this familial pathogenic variant. As there are generally no childhood cancer risks associated with a single pathogenic variant in the BRCA2 gene,  individuals in the family are not recommended to have testing until they reach at least 24 years of age. They may contact our office at [phone] for more information or to schedule an appointment.  Family members who live outside of the area are encouraged to find a genetic counselor in their area by visiting: [email or web address].   Resources:  FORCE (Facing Our Risk of Cancer Empowered) is a resource for those with a hereditary predisposition to develop cancer.  FORCE provides information about risk reduction, advocacy, legislation, and clinical trials.  Additionally, FORCE provides a platform for collaboration and support; which includes: peer navigation, message boards, local support groups, a toll-free help line, research registry and recruitment, advocate training, published medical research, webinars, brochures, mastectomy photos, and more.  For more information, visit www.facingourrisk.org   We encouraged Audrey Matthews to remain in contact with us  on an annual basis so we can update her personal and family histories, and let her know of advances in cancer genetics that may benefit the family. Our contact number was provided. Audrey Matthews questions were answered to her satisfaction today, and she knows she is  welcome to call anytime with additional questions.   Jonette Wassel P. Perri, MS, [facility] Licensed, Patent attorney [email or web address] phone: [phone]

## 2023-11-16 NOTE — BH Specialist Note (Unsigned)
 Pt did not arrive to video visit and did not answer the phone; Left HIPPA-compliant message to call back Warren from [employer] for [employer] at [facility] at  [phone] [facility] office).  ?; left MyChart message for patient.  ? ?

## 2023-11-17 ENCOUNTER — Ambulatory Visit: Payer: MEDICAID | Admitting: Clinical

## 2023-11-17 DIAGNOSIS — Z91199 Patient's noncompliance with other medical treatment and regimen due to unspecified reason: Secondary | ICD-10-CM

## 2023-11-18 ENCOUNTER — Encounter: Payer: Self-pay | Admitting: Adult Health

## 2023-11-18 ENCOUNTER — Other Ambulatory Visit (HOSPITAL_COMMUNITY)
Admission: RE | Admit: 2023-11-18 | Discharge: 2023-11-18 | Disposition: A | Discharge status: Home / Self Care | Payer: MEDICAID | Source: Ambulatory Visit | Attending: Adult Health | Admitting: Adult Health

## 2023-11-18 ENCOUNTER — Ambulatory Visit (INDEPENDENT_AMBULATORY_CARE_PROVIDER_SITE_OTHER): Payer: MEDICAID | Admitting: Adult Health

## 2023-11-18 VITALS — BP 117/80 | HR 70 | Ht 61.0 in | Wt 167.0 lb

## 2023-11-18 DIAGNOSIS — Z0000 Encounter for general adult medical examination without abnormal findings: Secondary | ICD-10-CM | POA: Insufficient documentation

## 2023-11-18 DIAGNOSIS — F419 Anxiety disorder, unspecified: Secondary | ICD-10-CM | POA: Diagnosis not present

## 2023-11-18 DIAGNOSIS — F32A Depression, unspecified: Secondary | ICD-10-CM

## 2023-11-18 DIAGNOSIS — R102 Pelvic and perineal pain: Secondary | ICD-10-CM

## 2023-11-18 MED ORDER — CYCLOBENZAPRINE HCL 10 MG PO TABS: 10.0000 mg | ORAL_TABLET | Freq: Two times a day (BID) | ORAL | 0 refills | Status: AC | PRN

## 2023-11-18 NOTE — Progress Notes (Signed)
 Patient ID: Audrey Matthews, female   DOB: [birth year], 23 y.o.   MRN: [id number] History of Present Illness: Audrey Matthews is a 23 year old white female, married, [id number] in for a well woman gyn exam and pap. She has cramping during and after sex for last month and nipples are sensitive for last week or so. She has back pain at times and take flexeril  and requests refill.  PCP is Dr Toribio    Current Medications, Allergies, Past Medical History, Past Surgical History, Family History and Social History were reviewed in [employer] electronic medical record.     Review of Systems: Patient denies any headaches, hearing loss, fatigue, blurred vision, shortness of breath, chest pain, abdominal pain, problems with bowel movements,or urination.  No joint pain or mood swings.  See HPI for positives.   Physical Exam:BP 117/80 (BP Location: Left Arm, Patient Position: Sitting, Cuff Size: Normal)   Pulse 70   Ht 5' 1 (1.549 m)   Wt 167 lb (75.8 kg)   LMP 10/30/2023   Breastfeeding No   BMI 31.55 kg/m   General:  Well developed, well nourished, no acute distress Skin:  Warm and dry Neck:  Midline trachea, normal thyroid , good ROM, no lymphadenopathy Lungs; Clear to auscultation bilaterally Breast:  No dominant palpable mass, retraction, or nipple discharge Cardiovascular: Regular rate and rhythm Abdomen:  Soft, non tender, no hepatosplenomegaly Pelvic:  External genitalia is normal in appearance, no lesions.  The vagina is normal in appearance. Urethra has no lesions or masses. The cervix is bulbous, everted at os, pap with GC/CHL performed. Uterus is felt to be normal size, shape, and contour.  No adnexal masses or tenderness noted.Bladder is non tender, no masses felt. Extremities/musculoskeletal:  No swelling or varicosities noted, no clubbing or cyanosis Psych:  No mood changes, alert and cooperative,seems happy AA is 3      11/18/2023    2:49 PM 07/19/2023    4:13 PM 07/20/2022   10:01 AM   Depression screen PHQ 2/9  Decreased Interest 1 1 0  Down, Depressed, Hopeless 2 0 0  PHQ - 2 Score 3 1 0  Altered sleeping  1 1  Tired, decreased energy 2 1 1   Change in appetite 2 1 2   Feeling bad or failure about yourself  2 1 0  Trouble concentrating 2 1 1   Moving slowly or fidgety/restless 1 0 0  Suicidal thoughts 1 0 0  PHQ-9 Score  6 5   Sees [facility],but missed appt yesterday, had phone issues     11/18/2023    2:49 PM 07/19/2023    4:15 PM 07/20/2022   10:01 AM 11/05/2020    1:43 PM  GAD 7 : Generalized Anxiety Score  Nervous, Anxious, on Edge 3 1 1 1   Control/stop worrying 3 1 2 1   Worry too much - different things 3 1 1 1   Trouble relaxing 3 1 1 1   Restless 3 0 0 1  Easily annoyed or irritable 2 1 2 2   Afraid - awful might happen 2 0 1 1  Total GAD 7 Score 19 5 8 8       Upstream - 11/18/23 1445       Pregnancy Intention Screening   Does the patient want to become pregnant in the next year? No    Does the patient's partner want to become pregnant in the next year? No    Would the patient like to discuss contraceptive options today? No  Contraception Wrap Up   Current Method Female Sterilization    End Method Female Sterilization    Contraception Counseling Provided No         Examination chaperoned by Clarita Salt LPN  Impression and plan: 1. Routine general medical examination at a health care facility (Primary) Pap sent Pap in 3 years if normal Physical in 1 year Labs with PCP  - Cytology - PAP( [place]) Refilled flexeril  at her request  Meds ordered this encounter  Medications   cyclobenzaprine  (FLEXERIL ) 10 MG tablet    Sig: Take 1 tablet (10 mg total) by mouth 2 (two) times daily as needed for muscle spasms.    Dispense:  20 tablet    Refill:  0    Supervising Provider:   JAYNE MINDER H [[id number]]    2. Pelvic cramping Has cramping with sex and after for last month Get him to pull out before ejaculation to see if helps  3. Anxiety and  depression Continue to see [facility]

## 2023-11-23 LAB — CYTOLOGY - PAP
Chlamydia: NEGATIVE
Comment: NEGATIVE
Comment: NEGATIVE
Comment: NORMAL
Diagnosis: NEGATIVE
High risk HPV: NEGATIVE
Neisseria Gonorrhea: NEGATIVE

## 2023-11-24 ENCOUNTER — Ambulatory Visit: Payer: Self-pay | Admitting: Adult Health

## 2023-12-17 NOTE — Progress Notes (Signed)
 Division of Surgical Oncology--Breast Surgery  Breast Clinic--New Patient Consultation  DATE OF SERVICE: 12/17/2023  REFERRING PROVIDER: Perri Pao [address] [place],  [place] [address]  [address] [place] [address] [phone]  PCP: Toribio Jerel MATSU, MD [address] [place] [id number] [phone]  CC: Tamlyn Sides is a 23 y.o. female who is seen in consultation at the request of Pao Perri for discussion of breast cancer risk, screening recommendations, and management options related to a genetic mutation (BRCA2).  HISTORY OF PRESENT ILLNESS Clinical Presentation: The patient presents with a family history of breast cancer and a genetic mutation (BRCA2), but no current breast complaints. The patient has not noted a change on breast exam. Patient denies nipple discharge, skin changes, or palpable masses. Patient does routinely perform self breast exams monthly. No prior breast imaging.  Patient is accompanied today by her husband, Jacques.  BREAST SUMMARY: Patient denies previous breast biopsy, breast surgery, or a personal history of breast cancer.  Genetic Testing:    Date 10/2023   Location  [facility]   Result Positive   Details Ms. 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   Comment A few studies have been performed on this particular variant have conflicting information. Based on the majority of available evidence to date, this variant is likely to be pathogenic. However, because this variant was reported as having an intermediate or inconclusive impact in several functional studies, and may have been identified in one or more patients with Fanconi Anemia, it may be hypomorphic and thus, carriers of this variant and their families may present with reduced risks, and not with the typical clinical  characteristics of a high-risk pathogenic BRCA2 alteration. As risk estimates are unknown at this time, clinical correlation is advised.  - per 11/15/2023 genetic counseling note, Pao Perri, [facility]   Gynecologic History: Age of menarche: 44. Last menstrual period: 12/06/2023, premenopausal, regular menses. Hormone-based contraception: OCP at age 78 to stimulate cycles, IUD - history of use x 2 IUDs, and other contraception including Depo injections. Fertility treatments: reports history of Clomid  x 3 weeks. Hormone replacement therapy: denies. Patient is G3 P2. Age of first live birth was 49. Patient did breastfeed both daugters.   Other Risk Factors: History of RT: denies Ashkenazi Jewish Ancestry: denies  Cancer-Related Family History:  Daughter (Autumn), no malignancies, born premature at 46 weeks Daughter Human resources officer) no malignancies Sister Juris), no malignancies, alive at 81; no GT Sister Wadie), no malignancies, alive at 61; no GT Mother, anal cancer, BRCA2+, alive at 42 Maternal grandmother, breast cancer at 76, deceased at 24 Father, leukemia, alive (estranged)  Paternal uncle, pancreatic cancer Paternal uncle, no malignancies   PAST MEDICAL HISTORY Past Medical History:  Diagnosis Date  . ADHD   . Asthma, unspecified asthma severity, unspecified whether complicated, unspecified whether persistent (HHS-HCC)   . Autism (HHS-HCC)    learns best through visuals  . Cervical incompetence    cervical incompetence and premature delivery at 28 weeks during her first pregnancy,  . OCD (obsessive compulsive disorder)   . Seizures (CMS/HHS-HCC)    staring seizures; neurology evaluation pending   PAST SURGICAL HISTORY: Past Surgical History:  Procedure Laterality Date  . LAPAROSCOPIC TUBAL LIGATION Bilateral 01/04/2023   Pathology: Benign bilateral fimbriated fallopian tubes.  SABRA CAUTERIZATION/ABLATION NASAL TURBINATE MUCOSA    .  EXTRACTION TEETH     wisdom teeth    SOCIAL  HISTORY: Social History   Socioeconomic History  . Marital status: Married    Spouse name: Jacques  . Number of children: 2  . Years of education: 12th grade - homeschool  Occupational History  . Occupation: Not Currently Employed  Tobacco Use  . Smoking status: Never  . Smokeless tobacco: Never  Vaping Use  . Vaping status: Never Used  Substance and Sexual Activity  . Alcohol use: Yes  . Drug use: Never    Comment: CBD gummies to help w/ sleep & anxiety  . Sexual activity: Yes    Partners: Male    Birth control/protection: Surgical    Comment: s/p bilateral salpingectomy  Social History Narrative   *Last updated 12/17/2023   Preferred pronouns: She/Her/Hers   Married, 2 daughters    Environmental health practitioner relative: Husband, Education officer, community   Living Arrangements: Resides in [place], [place] w/ her husband and 2 daughters   Her mother lives close    Education: 12th grade    Exercise: None routine   Diet: No restrictions   Social Drivers of Corporate investment banker Strain: Low Risk  (11/18/2023)   Received from [facility]   Overall Financial Resource Strain (CARDIA)   . How hard is it for you to pay for the very basics like food, housing, medical care, and heating?: Not very hard  Food Insecurity: Food Insecurity Present (11/18/2023)   [facility]   Hunger Vital Sign   . Within the past 12 months, you worried that your food would run out before you got the money to buy more.: Never true   . Within the past 12 months, the food you bought just didn't last and you didn't have money to get more.: Sometimes true  Transportation Needs: No Transportation Needs (11/18/2023)   Received from [facility] - [facility]   . In the past 12 months, has lack of transportation kept you from medical appointments or from getting medications?: No   . In the past 12 months, has lack of transportation kept you from meetings, work, or from getting things needed for daily living?: No   FAMILY  HISTORY: Family History  Problem Relation Name Age of Onset  . Diabetes Mother    . Colon cancer Mother    . Cancer Mother         anal  . BRCA2 Positive Mother    . High blood pressure (Hypertension) Father    . Diabetes Father     MEDICATIONS:  Current Outpatient Medications:  .  cyclobenzaprine  (FLEXERIL ) 10 MG tablet, Take 10 mg by mouth 2 (two) times daily as needed for Muscle spasms, Disp: , Rfl:   ALLERGIES: Allergies  Allergen Reactions  . Latex Itching and Rash    REVIEW OF SYSTEMS: General: no fevers, no chills, + change in appetite, + weight loss, + hot flashes, + fatigue ? HEENT: no neck swelling, no hoarseness, no hearing loss, no change in vision Resp: no cough, no wheezing, no hemoptysis, no trouble breathing ? Cardiac: no chest pain/pressure, no palpitations?, no arm swelling, no leg swelling Gastrointestinal: no nausea, no vomiting, + diarrhea, + constipation, no hematochezia ? Musculoskeletal: + back pain, + neck pain, + leg pain, + arm pain, no joint pain ? UroGyn: no hematuria, no dysuria, no vaginal dryness, + dyspareunia, no abnormal vaginal bleeding Other: + easy bruising, no excessive bleeding, + seizures, + headaches, +  weakness, no neuropathy Psych: + change in sleep, no trouble sleeping, + depression, + anxiety, + mood swings  PHYSICAL EXAM  BP (!) 140/80   Pulse 71   Temp 36.9 C (98.4 F)   Ht 154.9 cm (5' 1)   Wt 76.5 kg (168 lb 9.6 oz)   SpO2 98%   BMI 31.86 kg/m  General appearance alert, appears stated age, and cooperative  Psych Appropriate  Neurological Intact and nonfocal  Neck Supple without adenopathy  Musculoskeletal Gait: normal Lower extremities: no edema  Skin Inspection: no erythema or rash    Lymph Nodes Cervical, supraclavicular, and axillary nodes normal.  Breasts Symmetric, noted ptosis Bra size: DD  Right Breast examined in upright and supine position no suspicious masses, skin or nipple changes or axillary nodes    Left Breast examined in upright and supine position no suspicious masses, skin or nipple changes or axillary nodes    ASSESSMENT & DISCUSSION   This is a 23 y.o. female with a BRCA2 mutation, family history of breast cancer, and increased lifetime risk of breast cancer.   The patient is without evidence of breast malignancy by exam or history.  We spent the majority of the consultation today discussing management options for a BRCA2 mutation. We discussed that her lifetime risk of breast cancer is likely ~50% (given the specific variant identified). We reviewed the NCCN guidelines (www.nccn.org) regarding a BRCA-related breast and/or ovarian cancer syndrome. These include: breast awareness starting at age 55y; clinical breast exams every 6-12 months starting at age 32y, breast screening (ages 51-29: annual breast MRI; ages 56-75: annual mammogram and MRI; ages >83: management on individual basis), discussion of risk-reducing mastectomy and consider risk reduction agents. A copy of the NCCN guidelines were provided to the patient during today's visit.   The management options for the breasts specifically include surveillance, chemoprevention, and risk-reducing surgery. With regard to surveillance, we discussed the use of breast MRI and mammograms, alternating imaging modalities every 6 months, and having clinical breast exams every 6-12 months. The patient understands that the purpose of surveillance is to diagnose a breast cancer at an early stage and thus allow earlier intervention. With regard to chemoprevention, some data suggests that it may reduce one's risk of breast cancer. However, given her age, chemoprevention with Tamoxifen is not advised at this time. With regard to surgery, we briefly discussed that some choose to have bilateral risk-reducing mastectomies, and that if interested in this option, reconstruction may be an option. Prophylactic mastectomies have been shown to significantly  reduce the risk of breast cancer by 90-95%, but would not likely eliminate it completely.    Given the patient's current young age (25y) and the recommended age of screening (25y), routine breast imaging is not indicated at this time. Recommend followup in 1 year for clinical breast exam and initiation of high risk screening at that time.  Regarding surgery, the patient is not interested in surgery and strongly prefers screening imaging moving forward.        PLAN   Recommend annual breast MRI, initiate at age 43 (~08/2024)  Recommend initiating annual screening mammogram with tomosynthesis at age 1 Recommend clinical breast exams, including in the Breast Clinic, return to clinic in 08/2024 Advised to continue self exam of the breasts and to report to this office sooner with any new areas of abnormality or concern  The patient expressed understanding of this plan, was able to ask questions, and was in agreement with the proposed  strategy. The patient was given my contact information and was encouraged to call with any questions.  Attestation Statement:  I spent a total of 60 minutes in both face-to-face and non-face-to-face activities, excluding procedures performed, for this visit on the date of this encounter.  I personally performed the service, non-incident to. (WP)  Audrey MARIE WHITE, NP Breast Surgery

## 2024-05-04 ENCOUNTER — Ambulatory Visit (HOSPITAL_COMMUNITY): Payer: MEDICAID
